# Patient Record
Sex: Female | Born: 1975 | Race: White | Hispanic: No | Marital: Married | Smoking: Never smoker
Health system: Southern US, Community
[De-identification: ages and names within clinical notes are randomized; demographics above are authoritative.]

## PROBLEM LIST (undated history)

## (undated) DIAGNOSIS — Q2111 Secundum atrial septal defect: Secondary | ICD-10-CM

## (undated) DIAGNOSIS — G90A Postural orthostatic tachycardia syndrome (POTS): Secondary | ICD-10-CM

## (undated) DIAGNOSIS — B9689 Other specified bacterial agents as the cause of diseases classified elsewhere: Secondary | ICD-10-CM

## (undated) DIAGNOSIS — H8109 Meniere's disease, unspecified ear: Secondary | ICD-10-CM

## (undated) DIAGNOSIS — H838X1 Other specified diseases of right inner ear: Secondary | ICD-10-CM

## (undated) DIAGNOSIS — N83209 Unspecified ovarian cyst, unspecified side: Secondary | ICD-10-CM

## (undated) DIAGNOSIS — Z975 Presence of (intrauterine) contraceptive device: Secondary | ICD-10-CM

## (undated) DIAGNOSIS — T8859XA Other complications of anesthesia, initial encounter: Secondary | ICD-10-CM

## (undated) DIAGNOSIS — I951 Orthostatic hypotension: Secondary | ICD-10-CM

## (undated) DIAGNOSIS — Z872 Personal history of diseases of the skin and subcutaneous tissue: Secondary | ICD-10-CM

## (undated) DIAGNOSIS — K56609 Unspecified intestinal obstruction, unspecified as to partial versus complete obstruction: Secondary | ICD-10-CM

## (undated) DIAGNOSIS — Z8673 Personal history of transient ischemic attack (TIA), and cerebral infarction without residual deficits: Secondary | ICD-10-CM

## (undated) DIAGNOSIS — I959 Hypotension, unspecified: Secondary | ICD-10-CM

## (undated) DIAGNOSIS — T4145XA Adverse effect of unspecified anesthetic, initial encounter: Secondary | ICD-10-CM

## (undated) DIAGNOSIS — I472 Ventricular tachycardia: Secondary | ICD-10-CM

## (undated) DIAGNOSIS — F419 Anxiety disorder, unspecified: Secondary | ICD-10-CM

## (undated) DIAGNOSIS — R319 Hematuria, unspecified: Secondary | ICD-10-CM

## (undated) DIAGNOSIS — D4989 Neoplasm of unspecified behavior of other specified sites: Secondary | ICD-10-CM

## (undated) DIAGNOSIS — I4729 Other ventricular tachycardia: Secondary | ICD-10-CM

## (undated) DIAGNOSIS — N760 Acute vaginitis: Secondary | ICD-10-CM

## (undated) DIAGNOSIS — Z87898 Personal history of other specified conditions: Secondary | ICD-10-CM

## (undated) DIAGNOSIS — N200 Calculus of kidney: Secondary | ICD-10-CM

## (undated) DIAGNOSIS — E049 Nontoxic goiter, unspecified: Secondary | ICD-10-CM

## (undated) DIAGNOSIS — F514 Sleep terrors [night terrors]: Secondary | ICD-10-CM

## (undated) DIAGNOSIS — R000 Tachycardia, unspecified: Secondary | ICD-10-CM

## (undated) DIAGNOSIS — Q211 Atrial septal defect: Secondary | ICD-10-CM

## (undated) DIAGNOSIS — I498 Other specified cardiac arrhythmias: Secondary | ICD-10-CM

## (undated) HISTORY — DX: Unspecified ovarian cyst, unspecified side: N83.209

## (undated) HISTORY — DX: Neoplasm of unspecified behavior of other specified sites: D49.89

## (undated) HISTORY — DX: Other specified bacterial agents as the cause of diseases classified elsewhere: B96.89

## (undated) HISTORY — PX: THYROID CYST EXCISION: SHX2511

## (undated) HISTORY — DX: Secundum atrial septal defect: Q21.11

## (undated) HISTORY — DX: Tachycardia, unspecified: R00.0

## (undated) HISTORY — PX: TONSILLECTOMY: SUR1361

## (undated) HISTORY — DX: Other ventricular tachycardia: I47.29

## (undated) HISTORY — DX: Orthostatic hypotension: I95.1

## (undated) HISTORY — PX: OTHER SURGICAL HISTORY: SHX169

## (undated) HISTORY — DX: Postural orthostatic tachycardia syndrome (POTS): G90.A

## (undated) HISTORY — DX: Meniere's disease, unspecified ear: H81.09

## (undated) HISTORY — DX: Nontoxic goiter, unspecified: E04.9

## (undated) HISTORY — DX: Other specified cardiac arrhythmias: I49.8

## (undated) HISTORY — DX: Acute vaginitis: N76.0

## (undated) HISTORY — DX: Other specified diseases of right inner ear: H83.8X1

## (undated) HISTORY — PX: CARDIAC SURGERY: SHX584

## (undated) HISTORY — DX: Unspecified intestinal obstruction, unspecified as to partial versus complete obstruction: K56.609

## (undated) HISTORY — DX: Atrial septal defect: Q21.1

## (undated) HISTORY — DX: Presence of (intrauterine) contraceptive device: Z97.5

## (undated) HISTORY — DX: Ventricular tachycardia: I47.2

## (undated) HISTORY — DX: Anxiety disorder, unspecified: F41.9

## (undated) HISTORY — DX: Personal history of transient ischemic attack (TIA), and cerebral infarction without residual deficits: Z86.73

---

## 2005-07-02 ENCOUNTER — Ambulatory Visit: Payer: Self-pay | Admitting: Family Medicine

## 2005-07-24 ENCOUNTER — Ambulatory Visit: Payer: Self-pay | Admitting: Family Medicine

## 2005-08-21 ENCOUNTER — Encounter (INDEPENDENT_AMBULATORY_CARE_PROVIDER_SITE_OTHER): Payer: Self-pay | Admitting: Family Medicine

## 2005-10-10 ENCOUNTER — Ambulatory Visit: Payer: Self-pay | Admitting: Family Medicine

## 2005-10-10 LAB — CONVERTED CEMR LAB: Urinalysis: NORMAL

## 2005-11-07 ENCOUNTER — Ambulatory Visit: Payer: Self-pay | Admitting: Family Medicine

## 2005-11-14 ENCOUNTER — Encounter (INDEPENDENT_AMBULATORY_CARE_PROVIDER_SITE_OTHER): Payer: Self-pay | Admitting: Family Medicine

## 2005-11-14 LAB — CONVERTED CEMR LAB: Pap Smear: NORMAL

## 2005-11-21 ENCOUNTER — Encounter (INDEPENDENT_AMBULATORY_CARE_PROVIDER_SITE_OTHER): Payer: Self-pay | Admitting: Family Medicine

## 2005-11-22 ENCOUNTER — Ambulatory Visit: Payer: Self-pay | Admitting: Family Medicine

## 2005-11-26 ENCOUNTER — Encounter (INDEPENDENT_AMBULATORY_CARE_PROVIDER_SITE_OTHER): Payer: Self-pay | Admitting: Family Medicine

## 2005-11-28 ENCOUNTER — Encounter (INDEPENDENT_AMBULATORY_CARE_PROVIDER_SITE_OTHER): Payer: Self-pay | Admitting: Family Medicine

## 2005-12-03 ENCOUNTER — Encounter: Payer: Self-pay | Admitting: Family Medicine

## 2005-12-03 DIAGNOSIS — F411 Generalized anxiety disorder: Secondary | ICD-10-CM | POA: Insufficient documentation

## 2006-03-03 ENCOUNTER — Telehealth (INDEPENDENT_AMBULATORY_CARE_PROVIDER_SITE_OTHER): Payer: Self-pay | Admitting: Family Medicine

## 2006-05-26 ENCOUNTER — Ambulatory Visit: Payer: Self-pay | Admitting: Family Medicine

## 2006-05-26 ENCOUNTER — Telehealth (INDEPENDENT_AMBULATORY_CARE_PROVIDER_SITE_OTHER): Payer: Self-pay | Admitting: Family Medicine

## 2006-05-28 ENCOUNTER — Telehealth (INDEPENDENT_AMBULATORY_CARE_PROVIDER_SITE_OTHER): Payer: Self-pay | Admitting: *Deleted

## 2006-07-04 ENCOUNTER — Encounter (INDEPENDENT_AMBULATORY_CARE_PROVIDER_SITE_OTHER): Payer: Self-pay | Admitting: Family Medicine

## 2006-11-19 ENCOUNTER — Ambulatory Visit: Payer: Self-pay | Admitting: Family Medicine

## 2006-11-19 LAB — CONVERTED CEMR LAB: Beta hcg, urine, semiquantitative: NEGATIVE

## 2006-12-16 DIAGNOSIS — R32 Unspecified urinary incontinence: Secondary | ICD-10-CM | POA: Insufficient documentation

## 2007-01-15 HISTORY — PX: FOOT NEUROMA SURGERY: SHX646

## 2007-01-20 ENCOUNTER — Ambulatory Visit: Payer: Self-pay | Admitting: Family Medicine

## 2007-02-13 ENCOUNTER — Telehealth (INDEPENDENT_AMBULATORY_CARE_PROVIDER_SITE_OTHER): Payer: Self-pay | Admitting: *Deleted

## 2007-02-13 ENCOUNTER — Ambulatory Visit: Payer: Self-pay | Admitting: Family Medicine

## 2007-02-16 ENCOUNTER — Encounter (INDEPENDENT_AMBULATORY_CARE_PROVIDER_SITE_OTHER): Payer: Self-pay | Admitting: Family Medicine

## 2007-02-18 ENCOUNTER — Encounter (INDEPENDENT_AMBULATORY_CARE_PROVIDER_SITE_OTHER): Payer: Self-pay | Admitting: Family Medicine

## 2007-06-10 ENCOUNTER — Telehealth (INDEPENDENT_AMBULATORY_CARE_PROVIDER_SITE_OTHER): Payer: Self-pay | Admitting: *Deleted

## 2007-06-15 ENCOUNTER — Encounter (INDEPENDENT_AMBULATORY_CARE_PROVIDER_SITE_OTHER): Payer: Self-pay | Admitting: Family Medicine

## 2007-06-23 ENCOUNTER — Other Ambulatory Visit: Admission: RE | Admit: 2007-06-23 | Discharge: 2007-06-23 | Payer: Self-pay

## 2007-06-23 ENCOUNTER — Encounter (INDEPENDENT_AMBULATORY_CARE_PROVIDER_SITE_OTHER): Payer: Self-pay | Admitting: Family Medicine

## 2007-06-23 ENCOUNTER — Ambulatory Visit: Payer: Self-pay | Admitting: Family Medicine

## 2007-06-23 ENCOUNTER — Telehealth (INDEPENDENT_AMBULATORY_CARE_PROVIDER_SITE_OTHER): Payer: Self-pay | Admitting: *Deleted

## 2007-06-29 LAB — CONVERTED CEMR LAB: Pap Smear: NORMAL

## 2007-06-30 ENCOUNTER — Encounter: Admission: RE | Admit: 2007-06-30 | Discharge: 2007-06-30 | Payer: Self-pay | Admitting: Family Medicine

## 2007-11-16 ENCOUNTER — Ambulatory Visit: Payer: Self-pay | Admitting: Family Medicine

## 2007-12-07 ENCOUNTER — Ambulatory Visit: Payer: Self-pay | Admitting: Family Medicine

## 2007-12-07 ENCOUNTER — Ambulatory Visit (HOSPITAL_COMMUNITY): Admission: RE | Admit: 2007-12-07 | Discharge: 2007-12-07 | Payer: Self-pay | Admitting: Family Medicine

## 2007-12-07 LAB — CONVERTED CEMR LAB
Basophils Absolute: 0 10*3/uL (ref 0.0–0.1)
Beta hcg, urine, semiquantitative: NEGATIVE
HCT: 40.2 % (ref 36.0–46.0)
Hemoglobin: 13.6 g/dL (ref 12.0–15.0)
Lymphocytes Relative: 23 % (ref 12–46)
MCHC: 34 g/dL (ref 30.0–36.0)
MCV: 88.9 fL (ref 78.0–100.0)
Monocytes Absolute: 0.4 10*3/uL (ref 0.1–1.0)
Monocytes Relative: 7 % (ref 3–12)
Neutro Abs: 3.3 10*3/uL (ref 1.7–7.7)
Urobilinogen, UA: 0.2
WBC: 5 10*3/uL (ref 4.0–10.5)

## 2007-12-28 ENCOUNTER — Ambulatory Visit: Payer: Self-pay | Admitting: Family Medicine

## 2007-12-28 DIAGNOSIS — N83209 Unspecified ovarian cyst, unspecified side: Secondary | ICD-10-CM | POA: Insufficient documentation

## 2007-12-30 ENCOUNTER — Telehealth (INDEPENDENT_AMBULATORY_CARE_PROVIDER_SITE_OTHER): Payer: Self-pay | Admitting: *Deleted

## 2008-01-13 ENCOUNTER — Encounter (INDEPENDENT_AMBULATORY_CARE_PROVIDER_SITE_OTHER): Payer: Self-pay | Admitting: Family Medicine

## 2008-01-27 ENCOUNTER — Encounter (INDEPENDENT_AMBULATORY_CARE_PROVIDER_SITE_OTHER): Payer: Self-pay | Admitting: Family Medicine

## 2008-02-02 ENCOUNTER — Ambulatory Visit: Payer: Self-pay | Admitting: Family Medicine

## 2008-02-08 ENCOUNTER — Ambulatory Visit: Payer: Self-pay | Admitting: Family Medicine

## 2008-02-10 ENCOUNTER — Telehealth (INDEPENDENT_AMBULATORY_CARE_PROVIDER_SITE_OTHER): Payer: Self-pay | Admitting: Family Medicine

## 2008-02-10 ENCOUNTER — Encounter (INDEPENDENT_AMBULATORY_CARE_PROVIDER_SITE_OTHER): Payer: Self-pay | Admitting: Family Medicine

## 2008-02-24 ENCOUNTER — Ambulatory Visit: Payer: Self-pay | Admitting: Family Medicine

## 2008-02-24 LAB — CONVERTED CEMR LAB: Rapid Strep: NEGATIVE

## 2008-03-11 ENCOUNTER — Encounter (INDEPENDENT_AMBULATORY_CARE_PROVIDER_SITE_OTHER): Payer: Self-pay | Admitting: Family Medicine

## 2008-07-01 ENCOUNTER — Encounter: Admission: RE | Admit: 2008-07-01 | Discharge: 2008-07-01 | Payer: Self-pay | Admitting: Family Medicine

## 2008-08-05 ENCOUNTER — Ambulatory Visit: Payer: Self-pay | Admitting: Family Medicine

## 2008-08-05 ENCOUNTER — Other Ambulatory Visit: Admission: RE | Admit: 2008-08-05 | Discharge: 2008-08-05 | Payer: Self-pay | Admitting: Family Medicine

## 2008-08-05 ENCOUNTER — Encounter (INDEPENDENT_AMBULATORY_CARE_PROVIDER_SITE_OTHER): Payer: Self-pay | Admitting: Family Medicine

## 2008-08-06 ENCOUNTER — Encounter (INDEPENDENT_AMBULATORY_CARE_PROVIDER_SITE_OTHER): Payer: Self-pay | Admitting: Family Medicine

## 2008-08-08 LAB — CONVERTED CEMR LAB
Basophils Absolute: 0 10*3/uL (ref 0.0–0.1)
Basophils Relative: 1 % (ref 0–1)
Candida species: NEGATIVE
Eosinophils Absolute: 0.1 10*3/uL (ref 0.0–0.7)
Eosinophils Relative: 1 % (ref 0–5)
Gardnerella vaginalis: POSITIVE — AB
HCT: 41.4 % (ref 36.0–46.0)
Hemoglobin: 13.3 g/dL (ref 12.0–15.0)
RBC: 4.55 M/uL (ref 3.87–5.11)
TSH: 1.411 microintl units/mL (ref 0.350–4.500)

## 2008-08-11 ENCOUNTER — Encounter (INDEPENDENT_AMBULATORY_CARE_PROVIDER_SITE_OTHER): Payer: Self-pay | Admitting: Family Medicine

## 2008-08-12 ENCOUNTER — Telehealth (INDEPENDENT_AMBULATORY_CARE_PROVIDER_SITE_OTHER): Payer: Self-pay | Admitting: *Deleted

## 2008-08-19 ENCOUNTER — Ambulatory Visit: Payer: Self-pay | Admitting: Family Medicine

## 2008-08-19 LAB — CONVERTED CEMR LAB
Bilirubin Urine: NEGATIVE
Glucose, Urine, Semiquant: NEGATIVE
Nitrite: NEGATIVE
Protein, U semiquant: >=300
Specific Gravity, Urine: 1.02

## 2008-08-23 ENCOUNTER — Ambulatory Visit: Payer: Self-pay | Admitting: Family Medicine

## 2008-08-25 ENCOUNTER — Encounter (INDEPENDENT_AMBULATORY_CARE_PROVIDER_SITE_OTHER): Payer: Self-pay | Admitting: Family Medicine

## 2008-09-01 ENCOUNTER — Ambulatory Visit: Payer: Self-pay | Admitting: Pulmonary Disease

## 2008-09-09 ENCOUNTER — Ambulatory Visit: Payer: Self-pay | Admitting: Cardiovascular Disease

## 2008-09-22 ENCOUNTER — Ambulatory Visit: Payer: Self-pay | Admitting: Family Medicine

## 2008-10-10 ENCOUNTER — Encounter (INDEPENDENT_AMBULATORY_CARE_PROVIDER_SITE_OTHER): Payer: Self-pay | Admitting: Family Medicine

## 2009-07-07 ENCOUNTER — Encounter: Admission: RE | Admit: 2009-07-07 | Discharge: 2009-07-07 | Payer: Self-pay | Admitting: Family Medicine

## 2010-02-04 ENCOUNTER — Encounter: Payer: Self-pay | Admitting: Family Medicine

## 2010-03-12 ENCOUNTER — Encounter (HOSPITAL_COMMUNITY): Payer: Self-pay

## 2010-03-12 ENCOUNTER — Inpatient Hospital Stay (HOSPITAL_COMMUNITY): Payer: BC Managed Care – PPO

## 2010-03-12 ENCOUNTER — Observation Stay (HOSPITAL_COMMUNITY)
Admission: AD | Admit: 2010-03-12 | Discharge: 2010-03-15 | DRG: 035 | Disposition: A | Discharge status: Home / Self Care | Payer: BC Managed Care – PPO | Source: Ambulatory Visit | Attending: Neurology | Admitting: Neurology

## 2010-03-12 DIAGNOSIS — N949 Unspecified condition associated with female genital organs and menstrual cycle: Secondary | ICD-10-CM | POA: Insufficient documentation

## 2010-03-12 DIAGNOSIS — R5381 Other malaise: Secondary | ICD-10-CM | POA: Insufficient documentation

## 2010-03-12 DIAGNOSIS — R5383 Other fatigue: Secondary | ICD-10-CM | POA: Insufficient documentation

## 2010-03-12 DIAGNOSIS — R209 Unspecified disturbances of skin sensation: Secondary | ICD-10-CM | POA: Insufficient documentation

## 2010-03-12 DIAGNOSIS — N938 Other specified abnormal uterine and vaginal bleeding: Secondary | ICD-10-CM | POA: Insufficient documentation

## 2010-03-12 DIAGNOSIS — R29810 Facial weakness: Principal | ICD-10-CM | POA: Insufficient documentation

## 2010-03-12 DIAGNOSIS — R42 Dizziness and giddiness: Secondary | ICD-10-CM | POA: Insufficient documentation

## 2010-03-12 LAB — APTT: aPTT: 28 seconds (ref 24–37)

## 2010-03-12 LAB — CBC
HCT: 42.1 % (ref 36.0–46.0)
MCH: 30.5 pg (ref 26.0–34.0)
Platelets: 262 10*3/uL (ref 150–400)
RBC: 4.69 MIL/uL (ref 3.87–5.11)
RDW: 13.1 % (ref 11.5–15.5)
WBC: 6.8 10*3/uL (ref 4.0–10.5)

## 2010-03-12 LAB — COMPREHENSIVE METABOLIC PANEL
AST: 22 U/L (ref 0–37)
Calcium: 9.3 mg/dL (ref 8.4–10.5)
Creatinine, Ser: 0.89 mg/dL (ref 0.4–1.2)
GFR calc Af Amer: >60 mL/min (ref >60)
GFR calc non Af Amer: >60 mL/min (ref >60)
Total Bilirubin: 0.8 mg/dL (ref 0.3–1.2)

## 2010-03-12 LAB — PROTIME-INR: INR: 0.97 (ref 0.00–1.49)

## 2010-03-13 ENCOUNTER — Inpatient Hospital Stay (HOSPITAL_COMMUNITY): Payer: BC Managed Care – PPO

## 2010-03-13 ENCOUNTER — Encounter (HOSPITAL_COMMUNITY): Payer: Self-pay | Admitting: Radiology

## 2010-03-13 LAB — URINALYSIS, ROUTINE W REFLEX MICROSCOPIC
Ketones, ur: 15 mg/dL — AB
Nitrite: NEGATIVE
Protein, ur: NEGATIVE mg/dL
Urobilinogen, UA: 1 mg/dL (ref 0.0–1.0)
pH: 6 (ref 5.0–8.0)

## 2010-03-13 LAB — URINE MICROSCOPIC-ADD ON

## 2010-03-13 LAB — LIPID PANEL
Cholesterol: 166 mg/dL (ref 0–200)
Total CHOL/HDL Ratio: 3.3 RATIO
VLDL: 18 mg/dL (ref 0–40)

## 2010-03-13 LAB — PREGNANCY, URINE: Preg Test, Ur: NEGATIVE

## 2010-03-13 LAB — RPR: RPR Ser Ql: NONREACTIVE

## 2010-03-13 LAB — RAPID URINE DRUG SCREEN, HOSP PERFORMED
Benzodiazepines: NOT DETECTED
Cocaine: NOT DETECTED
Opiates: NOT DETECTED
Tetrahydrocannabinol: NOT DETECTED

## 2010-03-13 MED ORDER — IOHEXOL 350 MG/ML SOLN
100.0000 mL | Freq: Once | INTRAVENOUS | Status: AC | PRN
  Administered 2010-03-13: 64 mL via INTRAVENOUS

## 2010-03-14 ENCOUNTER — Inpatient Hospital Stay (HOSPITAL_COMMUNITY): Payer: BC Managed Care – PPO

## 2010-03-14 DIAGNOSIS — G459 Transient cerebral ischemic attack, unspecified: Secondary | ICD-10-CM

## 2010-03-14 LAB — LUPUS ANTICOAGULANT PANEL: dRVVT Incubated 1:1 Mix: 41.6 secs (ref 36.2–44.3)

## 2010-03-14 LAB — C3 COMPLEMENT: C3 Complement: 111 mg/dL (ref 88–201)

## 2010-03-14 LAB — FACTOR 5 LEIDEN

## 2010-03-14 LAB — ANTITHROMBIN III: AntiThromb III Func: 109 % (ref 76–126)

## 2010-03-15 LAB — PROTIME-INR: Prothrombin Time: 12.7 seconds (ref 11.6–15.2)

## 2010-03-15 LAB — CBC
MCH: 30 pg (ref 26.0–34.0)
MCHC: 33.2 g/dL (ref 30.0–36.0)
Platelets: 211 10*3/uL (ref 150–400)
RBC: 4.16 MIL/uL (ref 3.87–5.11)

## 2010-03-15 LAB — BETA-2-GLYCOPROTEIN I ABS, IGG/M/A
Beta-2-Glycoprotein I IgA: 18 A Units (ref <20)
Beta-2-Glycoprotein I IgM: 19 M Units (ref <20)

## 2010-03-15 LAB — CARDIOLIPIN ANTIBODIES, IGG, IGM, IGA: Anticardiolipin IgA: 3 APL U/mL — ABNORMAL LOW (ref <22)

## 2010-03-15 NOTE — H&P (Signed)
NAME:  Estrada, Candice             ACCOUNT NO.:  [id number]  MEDICAL RECORD NO.:  [id number]          PATIENT TYPE:  LOCATION:                                 FACILITY:  PHYSICIAN:  Pramod P. Pearlean Brownie, MD         DATE OF BIRTH:  DATE OF ADMISSION: DATE OF DISCHARGE:                             HISTORY & PHYSICAL   [name] is seen emergently.  He is admitted emergently from my office today, where she was seen for symptoms of sudden onset of dizziness, numbness and left-sided weakness.  She is a 35 year old pleasant [name], who had a bath today and was drying her hair as she bent her neck up, she heard a sudden popping sound in her left ear followed by numbness involving the left cheek as well as upper extremity as well as weakness.  The husband noticed she had left facial droop, left hand weakness lasted only few minutes and recovered, but facial weakness lasted longer.  She called our office and was worked in emergently and stated her symptoms began at 8 o'clock, but within an hour left-sided weakness as well as facial weakness had improved, but left cheek and arm numbness has persisted.  She denies any headache, vertigo, nausea, leg weakness, gait or balance problems.  She has no history of recent injury to her neck being involved in a car accident.  There is no prior history of stroke, TIA or seizures.  She does have history of migraines which are not very active.  PAST MEDICAL HISTORY:  History of abortion, thyroid surgery for cyst.  MEDICATION ALLERGIES:  None listed.  HOME MEDICATIONS:  Estrogen pills.  REVIEW OF SYSTEMS:  Negative for any deep vein thrombosis, pulmonary embolism, recurrent miscarriages, chest pain, palpitations or diarrhea.  FAMILY HISTORY:  Not significant for anybody with strokes or TIAs at a young age.  PAST SURGICAL HISTORY:  Thyroid surgery.  PHYSICAL EXAMINATION:  GENERAL:  Anxious young [name] who is currently not in  distress. VITAL SIGNS:  She is afebrile, pulse rate is 92 per minute regular sinus, blood pressure is 143/81 earlier today, but in my office it is 105/70.  Distal pulses well felt. HEAD:  Nontraumatic. NECK:  Supple.  There is no bruit.  Hearing appears normal bilaterally. CARDIAC:  Regular heart sounds.  No murmur or gallop. LUNGS:  Clear to auscultation. ABDOMEN:  Soft and nontender. NEUROLOGICAL:  The patient is awake and alert.  She is oriented to time, place and person.  There is no aphasia, apraxia, or dysarthria, and movements are full range, has not seen nystagmus or internuclear ophthalmoplegia.  Visual acuity and fields seem adequate.  Face seems fairly symmetric.  I do not see any obvious weakness.  There is no upper extremity drift.  There is a mild finger-to-nose dysmetria on the left. There is decreased touch and pinprick sensation in the left face and upper extremity, but not in the lower extremities.  There is no drift. There is no focal weakness.  Her gait is slow, cautious, but steady. She has difficulty with tandem walking.  On a NIH stroke scale,  she scored 2, one for sensory loss and one for dysmetria.  DATA REVIEWED:  None.  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  The patient has no obvious vascular risk factors except being on estrogen pills.  PLAN:  The patient will be admitted emergently to [facility] for stroke workup.  She has presented within 12 hours of onset of her symptoms.  We will obtain stat CT scan of the head and stroke panel labs.  If the patient is interested, she may consider participation in the POINT trial.  We will check MRI scan of the brain with MRA of the brain, neck, echocardiogram, fasting lipid profile and hemoglobin A1c. Start aspirin  after she passes a swallow test.  We may need to check hypercoagulable panel and vasculitic panel labs as well.  The patient is critically ill at significant risk for worsening.  Her care requires medical decision making of high complexity.  I spent 1 hour of critical care time evaluating this patient and following her care.     Pramod P. Pearlean Brownie, MD     PPS/MEDQ  D:  03/12/2010  T:  03/13/2010  Job:  [id number]  Electronically Signed by Delia Heady MD on 03/15/2010 01:23:32 PM

## 2010-03-17 LAB — PROTEIN C, TOTAL: Protein C, Total: 122 % (ref 70–140)

## 2010-03-19 NOTE — Discharge Summary (Signed)
NAME:  [name], [name]             ACCOUNT NO.:  [id number]  MEDICAL RECORD NO.:  [id number]           PATIENT TYPE:  I  LOCATION:  3007                         FACILITY:  [facility]  PHYSICIAN:  Joycelyn Schmid, MD   DATE OF BIRTH:  08/21/1975  DATE OF ADMISSION:  03/12/2010 DATE OF DISCHARGE:  03/15/2010                              DISCHARGE SUMMARY   DIAGNOSES AT TIME OF DISCHARGE: 1. Non-organic left-sided weakness. 2. History of abortion. 3. Thyroid surgery for cyst. 4. Vaginal bleeding.  MEDICINES AT TIME OF DISCHARGE:  Oral contraceptives.  STUDIES PERFORMED: 1. MRI shows no acute stroke. 2. CT head negative. 3. CT angio of the neck is negative. 4. CT angio of the head is negative. 5. MRI C-spine is normal.  Mild degenerative changes without stenosis     or disk protrusion. 6. A 2-D echocardiogram shows EF of 55% to 60% with no obvious source     of embolus. 7. Carotid Doppler shows no ICA stenosis. 8. EKG shows normal sinus rhythm with nonspecific ST-T changes.  No     old tracing to compare.  LABORATORY STUDIES:  CH50 greater than 60.  Hypercoagulable panel with antithrombin III normal.  Protein C functional high at 139.  Protein S functional 122.  Lupus anticoagulant not detected.  Homocystine 8.1. Negative factor V mutation mostly.  Cardiolipin antibody negative. Coagulation studies normal.  CBC normal.  ANA negative.  C3 and C4 normal.  RPR normal.  Sed rate 4.  Hemoglobin A1c 5.2.  Urine drug screen negative.  Urinalysis with 3-4 white blood cells, few bacteria. Urine pregnancy is negative.  Cholesterol 166, triglycerides 92, HDL 50, LDL 98.  Chemistry is normal.  HISTORY OF PRESENT ILLNESS:  [name] was seen by Dr. Pearlean Brownie in his office on March 12, 2010, for symptoms of sudden onset of dizziness, numbness, and left-sided weakness.  She is a 35 year old pleasant Caucasian female who had a bath today and was trying her hair as she bent her neck  up she heard a sudden popping found in her left ear followed by numbness involving the left cheek as well as the upper extremity weakness.  Her husband noted she had some left facial droop, left hand weakness that lasted only a few minutes then recovered.  Her facial weakness lasted longer than her hand weakness.  She called [facility] and was worked in emergently.  She stated her symptoms began at 8 a.m., but within an hour the left hematemesis and facial weakness had improved but the left cheek and arm numbness persisted.  She denied headache, vertigo, nausea, leg weakness, gait, or balance problems.  She has no history of recent neck injury or car accident.  She has no history of stroke, TIA, or seizures.  She does have a history of migraines which were not very active.  Concern was that the patient had a brainstem infarct or possible left vertebral artery dissection.  She was not a TPA candidate secondary to quick resolution and timing.  She was admitted to the hospital for further stroke evaluation. HOSPITAL COURSE:  Workup was normal including CT of the  head, MRI and MRA of the head, CT angio of the head and neck, and MRI of the C-spine. The patient was initially enrolled into the POINT trial which is aspirin and Plavix versus aspirin alone for secondary prevention in patients with TIA or mild stroke.  She was enrolled.  At time of discharge, the patient's medicines were on hold as she was having vaginal bleeding after her cycle just ended 10 days previously.  [facility] will follow up with her.  In hospital, no organic reason was found for her left hemeparesis and facial droop.  The patient has had a lot of stressors in her life in the recent few months that may have contributed.  I have asked her to follow up with her primary care physician for any further recommendation.  Hospitalization was extended due to the patient's inability to tolerate  MRI and MRA without sedation.  CONDITION AT TIME OF DISCHARGE:  The patient alert and oriented x3. Normal speech.  Normal language.  Eye movements are full.  No drift or focal weakness.  The patient and RN had noted confusion the night prior but not present at time of discharge.  DISCHARGE AND PLAN: 1. Discharge home with family. 2. Followup OB/GYN related to vaginal bleeding within the next week. 3. Hold POINT study drugs for now.  [facility] to follow up with the patient. 4. Followup primary care physician within 1 month. 5. Follopup with neurology as needed.     Annie Main, N.P.   ______________________________ Joycelyn Schmid, MD    SB/MEDQ  D:  03/15/2010  T:  03/16/2010  Job:  [id number]  cc:   Pramod P. Pearlean Brownie, MD Dr. Margo Aye.  Electronically Signed by Annie Main N.P. on 03/16/2010 11:27:24 AM Electronically Signed by Joycelyn Schmid  on 03/19/2010 12:39:54 PM

## 2010-05-09 ENCOUNTER — Encounter: Payer: Self-pay | Admitting: Physician Assistant

## 2010-05-11 ENCOUNTER — Encounter: Payer: Self-pay | Admitting: Internal Medicine

## 2010-05-14 ENCOUNTER — Institutional Professional Consult (permissible substitution): Payer: BC Managed Care – PPO | Admitting: Internal Medicine

## 2010-05-18 ENCOUNTER — Encounter: Payer: Self-pay | Admitting: Internal Medicine

## 2010-06-07 ENCOUNTER — Other Ambulatory Visit: Payer: Self-pay | Admitting: Family Medicine

## 2010-06-07 DIAGNOSIS — Z1231 Encounter for screening mammogram for malignant neoplasm of breast: Secondary | ICD-10-CM

## 2010-06-08 ENCOUNTER — Ambulatory Visit (HOSPITAL_COMMUNITY)
Admission: RE | Admit: 2010-06-08 | Discharge: 2010-06-08 | Disposition: A | Discharge status: Home / Self Care | Payer: BC Managed Care – PPO | Source: Ambulatory Visit | Attending: Internal Medicine | Admitting: Internal Medicine

## 2010-06-08 DIAGNOSIS — Q211 Atrial septal defect: Secondary | ICD-10-CM | POA: Insufficient documentation

## 2010-06-08 DIAGNOSIS — G459 Transient cerebral ischemic attack, unspecified: Secondary | ICD-10-CM | POA: Insufficient documentation

## 2010-06-08 DIAGNOSIS — Q2111 Secundum atrial septal defect: Secondary | ICD-10-CM | POA: Insufficient documentation

## 2010-06-12 NOTE — Cardiovascular Report (Signed)
  NAME:  Estrada, Candice             ACCOUNT NO.:  [id number]  MEDICAL RECORD NO.:  [id number]           PATIENT TYPE:  O  LOCATION:  [facility]                         FACILITY:  [facility]  PHYSICIAN:  Italy Waverly Chavarria, MD         DATE OF BIRTH:  06/14/1975  DATE OF PROCEDURE:  06/08/2010 DATE OF DISCHARGE:                           CARDIAC CATHETERIZATION   OPERATOR:  Italy Crescent Gotham, MD  PROCEDURE:  Transesophageal echocardiography.  INDICATIONS:  Right-to-left shunt.  PAST MEDICAL HISTORY:  Ms. [name] is a 35 year old female who was found to have a large right-to-left shunt in the setting of a TIA was referred for a TEE for evaluation of that shunt.  PROCEDURE:  After procedural time-out, the patient was brought into the endoscopy suite and moderately sedated.  Once the sedation was in effect, a TEE was performed without any complications.  The patient recovered without difficulty and was discharged home.  FINDINGS: 1. Moderate left-to-right shunt by color Doppler without right to left     shunting by saline microbubble contrast, consistent with PFO.  2. LVEF greater than 55%. 3. Normal pulmonary venous connection and no evidence of AV cushion     defect or sinus venosus defect. 4. Trace MR and TR, with normal-appearing mitral valve leaflets.     Italy Rayli Wiederhold, MD     CH/MEDQ  D:  06/08/2010  T:  06/09/2010  Job:  [id number]  cc:   Pramod P. Pearlean Brownie, MD  Electronically Signed by Kirtland Bouchard. Ibrahima Holberg M.D. on 06/12/2010 11:57:44 AM

## 2010-07-02 ENCOUNTER — Ambulatory Visit: Payer: BC Managed Care – PPO | Admitting: Cardiovascular Disease

## 2010-07-13 ENCOUNTER — Ambulatory Visit
Admission: RE | Admit: 2010-07-13 | Discharge: 2010-07-13 | Disposition: A | Discharge status: Home / Self Care | Payer: BC Managed Care – PPO | Source: Ambulatory Visit | Attending: Family Medicine | Admitting: Family Medicine

## 2010-07-13 DIAGNOSIS — Z1231 Encounter for screening mammogram for malignant neoplasm of breast: Secondary | ICD-10-CM

## 2010-07-26 ENCOUNTER — Encounter: Payer: Self-pay | Admitting: Cardiovascular Disease

## 2010-07-31 ENCOUNTER — Ambulatory Visit (INDEPENDENT_AMBULATORY_CARE_PROVIDER_SITE_OTHER): Payer: BC Managed Care – PPO | Admitting: Cardiovascular Disease

## 2010-07-31 ENCOUNTER — Encounter: Payer: Self-pay | Admitting: *Deleted

## 2010-07-31 ENCOUNTER — Encounter: Payer: Self-pay | Admitting: Cardiovascular Disease

## 2010-07-31 VITALS — BP 104/69 | HR 74 | Resp 12 | Ht 63.0 in | Wt 141.0 lb

## 2010-07-31 DIAGNOSIS — Q211 Atrial septal defect: Secondary | ICD-10-CM

## 2010-07-31 MED ORDER — CLOPIDOGREL BISULFATE 75 MG PO TABS: 75.0000 mg | ORAL_TABLET | Freq: Every day | ORAL | Status: DC

## 2010-07-31 NOTE — Patient Instructions (Signed)
Procedure is scheduled for August 20, 2010 at [facility].  Your physician recommends that you return for lab work on August 15, 2010.  Your physician has recommended you make the following change in your medication: Start Plavix 75 mg by mouth daily.

## 2010-08-07 ENCOUNTER — Encounter: Payer: Self-pay | Admitting: *Deleted

## 2010-08-09 ENCOUNTER — Telehealth: Payer: Self-pay | Admitting: *Deleted

## 2010-08-09 NOTE — Telephone Encounter (Signed)
Spoke with pt and let her know lab work would be done at the hospital on the day of her procedure.

## 2010-08-10 ENCOUNTER — Encounter: Payer: Self-pay | Admitting: Cardiovascular Disease

## 2010-08-10 DIAGNOSIS — Q211 Atrial septal defect, unspecified: Secondary | ICD-10-CM | POA: Insufficient documentation

## 2010-08-10 NOTE — Progress Notes (Signed)
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  The patient presented February 27 with sudden onset of dizziness accompanied with left-sided weakness and numbness. She was also noted to have left facial droop and facial numbness. The patient was hospitalized and underwent extensive workup including CT and MRI of the brain. She underwent an MRA. This evaluation was negative. In followup she underwent a transcranial Doppler study which showed a very strongly positive intracardiac shunt. Patient ultimately underwent transesophageal echocardiogram by Dr. Rennis Golden on May 25 and this demonstrated a small ostium secundum ASD with moderate left to right shunting by color Doppler with normal left ventricular function and normal pulmonary venous return. There were no significant valvular lesions. The patient is now referred for consideration of transcatheter closure.  The patient has had further episodes of blurry vision and difficulty in word finding. These have been transient with episodes lasting up to one hour. She has not sought further medical attention. She has occasional chest pains and notably underwent a stress perfusion study which was reviewed today. This was negative for ischemia.  The patient has no history of DVT or arterial thrombosis. She has no family history of hypercoagulable disorder or congenital heart disease.  Outpatient Encounter Prescriptions as of 07/31/2010  Medication Sig Dispense Refill  . aspirin 81 MG tablet Take 81 mg by mouth daily.        . furosemide (LASIX) 20 MG tablet Take 20 mg by mouth as needed.        . vitamin B-12 (CYANOCOBALAMIN) 1000 MCG tablet Take 1,000 mcg by mouth daily.        . clopidogrel (PLAVIX) 75 MG tablet Take 1 tablet (75 mg total) by mouth daily.  30 tablet  11  . DISCONTD: buPROPion (WELLBUTRIN SR) 150 MG 12 hr tablet Take 150 mg by mouth daily.        Marland Kitchen DISCONTD: clonazePAM (KLONOPIN) 0.5 MG tablet Take 0.5 mg by mouth  daily.        Marland Kitchen DISCONTD: Lorita Officer Triphasic (TRI-SPRINTEC) 0.18/0.215/0.25 MG-35 MCG TABS Take by mouth as directed.          Review of patient's allergies indicates no known allergies.  Past Medical History  Diagnosis Date  . Ovarian cyst   . Urinary incontinence   . Migraine headache   . Anxiety     Past Surgical History  Procedure Date  . Thyroid cyst rem   . Tonsillectomy   . Foot neuroma surgery 2009    left  . Thyroid cyst excision     History   Social History  . Marital Status: Married    Spouse Name: N/A    Number of Children: N/A  . Years of Education: N/A   Occupational History  . 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 Smoking status: Never Smoker   . Smokeless tobacco: Not on file  . Alcohol Use: No  . Drug Use: No  . Sexually Active: Not on file   Other Topics Concern  . Not on file   Social History Narrative  . No narrative on file    Family History  Problem Relation Age of Onset  . Hypertension Father 74  . Stroke Father   . Heart attack Father   . Brain cancer Father   . Liver disease Mother 21    dead  . Hypertension Brother 35  . Breast cancer Other  PATERNAL AUNT    ROS: General: no fevers/chills/night sweats, positive for generalized fatigue Eyes: no blurry vision, diplopia, or amaurosis ENT: no sore throat or hearing loss Resp: no cough, wheezing, or hemoptysis CV: no edema or palpitations GI: no abdominal pain, nausea, vomiting, diarrhea, or constipation GU: no dysuria, frequency, or hematuria Skin: no rash Neuro: see history of present illness Musculoskeletal: no joint pain or swelling Heme: no bleeding, DVT, or easy bruising Endo: no polydipsia or polyuria  BP 104/69  Pulse 74  Resp 12  Ht 5\' 3"  (1.6 m)  Wt 141 lb (63.957 kg)  BMI 24.98 kg/m2  PHYSICAL EXAM: Pt is alert and oriented, WD, WN, in no distress. HEENT: normal Neck: JVP normal. Carotid upstrokes normal without  bruits. No thyromegaly. Lungs: equal expansion, clear bilaterally CV: Apex is discrete and nondisplaced, RRR without murmur or gallop Abd: soft, NT, +BS, no bruit, no hepatosplenomegaly Back: no CVA tenderness Ext: no C/C/E        Femoral pulses 2+= without bruits        DP/PT pulses intact and = Skin: warm and dry without rash Neuro: CNII-XII intact             Strength intact = bilaterally  EKG:  Normal sinus rhythm 85 beats per minute, within normal limits.  ASSESSMENT AND PLAN:

## 2010-08-10 NOTE — Assessment & Plan Note (Signed)
This is a 35 year old woman with recurrent TIA symptoms in the setting of ostium secundum ASD. She has no traditional risk factors for stroke. Her TEE images were reviewed and I agree that she has a small secundum ASD with moderate left-to-right shunting. These defects typically have bidirectional shunting with Valsalva. The patient has a strong desire for transcatheter closure. Potential benefits include prevention of recurrent paradoxical embolism and prevention of long-term right heart volume overload although this is probably less of an issue with a defect of this size.  We discussed risks, benefits, and alternatives to transcatheter closure at length. Risks include bleeding, infection, device embolization, stroke, myocardial infarction, cardiac perforation or tamponade, emergency surgery, and death. We also discussed the early or late device erosion and she understands this.  She understands the risk of a serious adverse event occurs approximately 1% of the time.  The patient will be started on Plavix 75 mg daily. She'll need to follow SBE prophylaxis for 6 months after device implantation.

## 2010-08-15 ENCOUNTER — Other Ambulatory Visit (INDEPENDENT_AMBULATORY_CARE_PROVIDER_SITE_OTHER): Payer: BC Managed Care – PPO | Admitting: *Deleted

## 2010-08-15 ENCOUNTER — Other Ambulatory Visit: Payer: BC Managed Care – PPO | Admitting: *Deleted

## 2010-08-15 DIAGNOSIS — Q2111 Secundum atrial septal defect: Secondary | ICD-10-CM

## 2010-08-15 DIAGNOSIS — Q211 Atrial septal defect: Secondary | ICD-10-CM

## 2010-08-15 LAB — BASIC METABOLIC PANEL
BUN: 10 mg/dL (ref 6–23)
CO2: 27 mEq/L (ref 19–32)
Calcium: 8.7 mg/dL (ref 8.4–10.5)
Creatinine, Ser: 0.9 mg/dL (ref 0.4–1.2)
Glucose, Bld: 88 mg/dL (ref 70–99)
Sodium: 140 mEq/L (ref 135–145)

## 2010-08-15 LAB — CBC WITH DIFFERENTIAL/PLATELET
Basophils Relative: 0.4 % (ref 0.0–3.0)
Eosinophils Relative: 1.9 % (ref 0.0–5.0)
HCT: 38 % (ref 36.0–46.0)
Hemoglobin: 12.6 g/dL (ref 12.0–15.0)
Lymphs Abs: 1.3 10*3/uL (ref 0.7–4.0)
MCV: 89.6 fl (ref 78.0–100.0)
Monocytes Absolute: 0.3 10*3/uL (ref 0.1–1.0)
Monocytes Relative: 6.8 % (ref 3.0–12.0)
Neutro Abs: 3.4 10*3/uL (ref 1.4–7.7)
RBC: 4.24 Mil/uL (ref 3.87–5.11)
WBC: 5.2 10*3/uL (ref 4.5–10.5)

## 2010-08-20 ENCOUNTER — Inpatient Hospital Stay (HOSPITAL_COMMUNITY)
Admission: RE | Admit: 2010-08-20 | Discharge: 2010-08-22 | DRG: 550 | Disposition: A | Discharge status: Home / Self Care | Payer: BC Managed Care – PPO | Source: Ambulatory Visit | Attending: Cardiovascular Disease | Admitting: Cardiovascular Disease

## 2010-08-20 DIAGNOSIS — D62 Acute posthemorrhagic anemia: Secondary | ICD-10-CM | POA: Diagnosis not present

## 2010-08-20 DIAGNOSIS — Q211 Atrial septal defect: Secondary | ICD-10-CM

## 2010-08-20 DIAGNOSIS — F411 Generalized anxiety disorder: Secondary | ICD-10-CM | POA: Diagnosis present

## 2010-08-20 DIAGNOSIS — G43909 Migraine, unspecified, not intractable, without status migrainosus: Secondary | ICD-10-CM | POA: Diagnosis present

## 2010-08-20 DIAGNOSIS — Q2111 Secundum atrial septal defect: Principal | ICD-10-CM

## 2010-08-20 DIAGNOSIS — Z7982 Long term (current) use of aspirin: Secondary | ICD-10-CM

## 2010-08-20 DIAGNOSIS — IMO0002 Reserved for concepts with insufficient information to code with codable children: Secondary | ICD-10-CM | POA: Diagnosis not present

## 2010-08-20 HISTORY — PX: ASD REPAIR: SHX258

## 2010-08-20 LAB — PREGNANCY, URINE: Preg Test, Ur: NEGATIVE

## 2010-08-20 LAB — CBC
HCT: 31 % — ABNORMAL LOW (ref 36.0–46.0)
Hemoglobin: 10 g/dL — ABNORMAL LOW (ref 12.0–15.0)
MCH: 28.9 pg (ref 26.0–34.0)
RBC: 3.46 MIL/uL — ABNORMAL LOW (ref 3.87–5.11)

## 2010-08-20 LAB — POCT ACTIVATED CLOTTING TIME
Activated Clotting Time: 204 seconds
Activated Clotting Time: 188 seconds

## 2010-08-21 ENCOUNTER — Ambulatory Visit (HOSPITAL_COMMUNITY): Payer: BC Managed Care – PPO

## 2010-08-21 DIAGNOSIS — Q211 Atrial septal defect: Secondary | ICD-10-CM

## 2010-08-21 LAB — BASIC METABOLIC PANEL
BUN: 5 mg/dL — ABNORMAL LOW (ref 6–23)
CO2: 24 mEq/L (ref 19–32)
Chloride: 111 mEq/L (ref 96–112)
GFR calc Af Amer: >60 mL/min (ref >60)
Potassium: 3.7 mEq/L (ref 3.5–5.1)

## 2010-08-21 LAB — CBC
Platelets: 166 10*3/uL (ref 150–400)
RBC: 3.17 MIL/uL — ABNORMAL LOW (ref 3.87–5.11)
WBC: 5.9 10*3/uL (ref 4.0–10.5)

## 2010-08-22 LAB — CBC
HCT: 28.3 % — ABNORMAL LOW (ref 36.0–46.0)
Hemoglobin: 9.4 g/dL — ABNORMAL LOW (ref 12.0–15.0)
RBC: 3.17 MIL/uL — ABNORMAL LOW (ref 3.87–5.11)
WBC: 6.4 10*3/uL (ref 4.0–10.5)

## 2010-08-23 ENCOUNTER — Telehealth: Payer: Self-pay | Admitting: Cardiovascular Disease

## 2010-08-23 NOTE — Telephone Encounter (Signed)
Spoke with pt's husband.Patient was d/c from the hospital yesterday post  ASD prosedure. Prior D/C she had developed a large hematoma in right groin. THE AREA IS SOFT BUT IT GOT BIGGER ABOUT 1/4 INCH  SINCE YESTERDAY, AND CONTINUE TO BE PAINFUL. Patient c/o of being cold all the time, her oral temerature is 97.0 orally today.

## 2010-08-23 NOTE — Telephone Encounter (Signed)
Dr. Excell Seltzer notified, MD will call the pt himself with recommendations.

## 2010-08-23 NOTE — Telephone Encounter (Signed)
Pt had a large hematoma and it seems to getting bigger but it is still soft and she still has not had a bowel movement since day before surgery and she wants to know if she can take something

## 2010-08-24 ENCOUNTER — Emergency Department (HOSPITAL_COMMUNITY)
Admission: EM | Admit: 2010-08-24 | Discharge: 2010-08-24 | Disposition: A | Discharge status: Home / Self Care | Payer: BC Managed Care – PPO | Attending: Emergency Medicine | Admitting: Emergency Medicine

## 2010-08-24 ENCOUNTER — Emergency Department (HOSPITAL_COMMUNITY): Payer: BC Managed Care – PPO

## 2010-08-24 ENCOUNTER — Telehealth: Payer: Self-pay | Admitting: Cardiovascular Disease

## 2010-08-24 DIAGNOSIS — E860 Dehydration: Secondary | ICD-10-CM | POA: Insufficient documentation

## 2010-08-24 DIAGNOSIS — Z8679 Personal history of other diseases of the circulatory system: Secondary | ICD-10-CM | POA: Insufficient documentation

## 2010-08-24 DIAGNOSIS — R42 Dizziness and giddiness: Secondary | ICD-10-CM

## 2010-08-24 DIAGNOSIS — R55 Syncope and collapse: Secondary | ICD-10-CM | POA: Insufficient documentation

## 2010-08-24 DIAGNOSIS — J90 Pleural effusion, not elsewhere classified: Secondary | ICD-10-CM | POA: Insufficient documentation

## 2010-08-24 DIAGNOSIS — N200 Calculus of kidney: Secondary | ICD-10-CM | POA: Insufficient documentation

## 2010-08-24 LAB — URINALYSIS, ROUTINE W REFLEX MICROSCOPIC
Glucose, UA: NEGATIVE mg/dL
Nitrite: NEGATIVE
Protein, ur: NEGATIVE mg/dL
Urobilinogen, UA: 0.2 mg/dL (ref 0.0–1.0)

## 2010-08-24 LAB — POCT I-STAT, CHEM 8
BUN: 8 mg/dL (ref 6–23)
Creatinine, Ser: 0.9 mg/dL (ref 0.50–1.10)
Potassium: 3.8 mEq/L (ref 3.5–5.1)
Sodium: 139 mEq/L (ref 135–145)

## 2010-08-24 LAB — CBC
Hemoglobin: 12.5 g/dL (ref 12.0–15.0)
MCH: 29.6 pg (ref 26.0–34.0)
MCV: 88.2 fL (ref 78.0–100.0)
RBC: 4.22 MIL/uL (ref 3.87–5.11)
WBC: 9.5 10*3/uL (ref 4.0–10.5)

## 2010-08-24 LAB — DIFFERENTIAL
Basophils Relative: 0 % (ref 0–1)
Lymphs Abs: 1.4 10*3/uL (ref 0.7–4.0)
Monocytes Relative: 7 % (ref 3–12)
Neutro Abs: 7.2 10*3/uL (ref 1.7–7.7)
Neutrophils Relative %: 76 % (ref 43–77)

## 2010-08-24 LAB — URINE MICROSCOPIC-ADD ON

## 2010-08-24 LAB — POCT I-STAT TROPONIN I: Troponin i, poc: 0 ng/mL (ref 0.00–0.08)

## 2010-08-24 LAB — D-DIMER, QUANTITATIVE: D-Dimer, Quant: 1.59 ug/mL-FEU — ABNORMAL HIGH (ref 0.00–0.48)

## 2010-08-24 NOTE — Telephone Encounter (Signed)
Pt has hematoma on leg was told by dr cooper to go to er if got worse today, she is on her way there now, almost passed out this am

## 2010-08-24 NOTE — Telephone Encounter (Signed)
Trish (cardmaster) aware.

## 2010-08-28 ENCOUNTER — Telehealth: Payer: Self-pay | Admitting: Cardiovascular Disease

## 2010-08-28 NOTE — Telephone Encounter (Signed)
Spoke with pt. She is continuing to have pain in groin area. States she increased activity yesterday and had a lot of pain last evening.  Also notes a knot in area. Appointment made for pt to see [name] on August 29, 2010 at 9:00.

## 2010-08-28 NOTE — Consult Note (Signed)
NAME:  Candice Estrada, Candice Estrada             ACCOUNT NO.:  [id number]  MEDICAL RECORD NO.:  [id number]  LOCATION:  [facility]                         FACILITY:  [facility]  PHYSICIAN:  Veverly Fells. Excell Seltzer, MD  DATE OF BIRTH:  December 09, 1975  DATE OF CONSULTATION:  08/24/2010 DATE OF DISCHARGE:  08/24/2010                                CONSULTATION   PRIMARY CARDIOLOGIST:  Italy Hilty, MD with [facility].  PATIENT PROFILE:  A 35 year old female with history of atrial septal defect, status post recent percutaneous ASD closure who presented back to the ED today secondary to lightheadedness.  PROBLEMS: 1. Presyncope, questionable orthostasis. 2. Ostium secundum atrial septal defect, status post percutaneous     closure, April 20, 2010. 3. Right groin hematoma - resolving. 4. Acute blood loss anemia - stable. 5. History of TIA. 6. History of ovarian cyst. 7. History of urinary incontinence. 8. Migraine headaches. 9. Anxiety. 10.Status post thyroid cyst resection. 11.Status post tonsillectomy. 12.Status post left foot neuroma surgery in 2009.  ALLERGIES:  No known drug allergies.  HISTORY OF PRESENT ILLNESS:  A 35 year old female with prior history of left-sided weakness and numbness along with facial droop who was extensively evaluated by Neurology and subsequently followed through intracardiac shunting later diagnosed as a small ostial secundum atrial septal defect with moderate left-to-right shunting, transesophageal echocardiogram in May 2012.  She was subsequently seen by Dr. Excell Seltzer for consideration of  percutaneous closure and then underwent percutaneous closure on August 20, 2010.  Hospital course was complicated by right groin hematoma following the procedure with reduction in hemoglobin and hematocrit to 9.4 and 28.3 from 12.6 and 38 on August 15, 2010.  H and H was stable and the patient was ambulating with mild groin discomfort and she was discharged on August 22, 2010.   Since discharge, she has been feeling lightheaded and dizzy every time she stands or sits up from a lying position.  She has had worsening of the right groin pain. She has been taking Ultram as needed generally at night, but does not like the way it makes her feel.  Has  profound lightheadedness this morning during which she felt like she might pass out.  She  was taken then to the MD by her daughter.  She is mildly tachycardic with rates sometimes in the low 100s.  Orthostatic vital signs were unrevealing.  She has been treated with fluid boluses by the ER staff.  HOME MEDICATIONS: 1. Aspirin 81 mg daily. 2. Ferrous sulfate 325 mg b.i.d. 3. Plavix 75 mg daily. 4. Tramadol 50 mg q.6 h. p.r.n. 5. Multivitamin daily. 6. Vitamin B12 1000 mcg daily.  FAMILY HISTORY:  Positive for MI, CVA, hypertension, and brain cancer in her father.  Her mother died at 36 with liver disease.  She has a brother with hypertension.  SOCIAL HISTORY:  The patient works as an Public house manager.  She has never smoked and denies alcohol or drug use.  REVIEW OF SYSTEMS:  Positive for right groin pain, orthostatic presyncope, anxiety, lower abdominal discomfort, she had mild sharp chest pain this morning.  She has also been constipated and not had bowel movements since her discharge 2 days ago.  PHYSICAL EXAMINATION:  VITAL SIGNS:  Temperature 98.5, heart rate 95- 116, respirations 21, blood pressure 97/53,  pulse ox 100% on room air. GENERAL:  A pleasant white female in no acute distress, awake and oriented x3.  She has normal affect. HEENT:  Normal. NEUROLOGIC:  Grossly intact and nonfocal. SKIN:  Warm and dry without lesions or masses. NECK:  Supple without bruits or JVD. LUNGS:  Respirations are unlabored.  Clear to auscultation. CARDIAC:  Regular S1, S2.  No S3, S4, murmurs. ABDOMEN:  Round, soft with right lower quadrant tenderness.  Bowel sounds are present x4. EXTREMITIES:  Warm, dry and pink.  No clubbing,  cyanosis, or edema. Distal pulses 2+ bilaterally.  She has significant right groin ecchymosis without significant swelling or hematoma.  Ecchymosis extends medially along the right thigh.  She is quite tender in that area.  EKG shows sinus rhythm at a rate of 95, normal axis.  No acute ST-T changes.  Hemoglobin 12.5, hematocrit 37.2, WBC 9.5, platelets 235. Sodium 139, potassium 3.8, chloride 105, CO2 of 23, BUN 18, creatinine 0.9, glucose 91.  ASSESSMENT AND PLAN: 1. Presyncope.  I suspect the patient is dehydrated in the setting of     recent procedure with acute blood loss anemia and rather rapid rise     of her H and H to 12.5 and 37.2, 9.4, and 28.3 just 2 days ago.     The patient was receiving saline boluses here in the ED and we have     encouraged increased p.o. intake including  pushing softs .  She     has had constipation, which is likely compounding this.  She has     been on iron at discharge with her H and H normalizing, but we     discontinued this. 2. Right groin hematoma with rising H and H doubt any additional     bleeding.  The patient is significantly tender not just in the     groin and thigh but also now to the right lower quadrant and flank.     We will go ahead and check     a noncontrast CT of the abdomen and pelvis to rule out     retroperitoneal bleed.  Provided that this is within normal limits     and the patient improved with fluids.  I suspect she can been     discharged home today from the emergency department later this     afternoon.     Nicolasa Ducking, ANP   ______________________________ Veverly Fells. Excell Seltzer, MD    CB/MEDQ  D:  08/24/2010  T:  08/24/2010  Job:  [id number]  cc:   Italy Hilty, MD  Electronically Signed by Nicolasa Ducking ANP on 08/27/2010 03:12:31 PM Electronically Signed by Tonny Bollman MD on 08/28/2010 05:25:13 AM

## 2010-08-28 NOTE — Discharge Summary (Signed)
NAME:  Candice Estrada, Candice Estrada             ACCOUNT NO.:  [id number]  MEDICAL RECORD NO.:  [id number]  LOCATION:  6523                         FACILITY:  [facility]  PHYSICIAN:  Veverly Fells. Excell Seltzer, MD  DATE OF BIRTH:  1975/09/09  DATE OF ADMISSION:  08/20/2010 DATE OF DISCHARGE:  08/22/2010                              DISCHARGE SUMMARY   PRIMARY CARDIOLOGIST:  Italy Hilty, MD with [facility].  DISCHARGE DIAGNOSIS:  Ostium secundum atrial septal defect, status post percutaneous closure this admission.  SECONDARY DIAGNOSES: 1. History of transient ischemic attacks. 2. Right groin hematoma. 3. Acute blood loss anemia. 4. History of ovarian cyst. 5. History of urinary incontinence. 6. Migraine headaches. 7. Anxiety. 8. Status post thyroid cyst resection. 9. Status post tonsillectomy. 10.Status post left foot neuroma surgery in 2009.  ALLERGIES:  No known drug allergies.  PROCEDURES: 1. Successful percutaneous closure of a small ostium secundum atrial     septal defect using an Amplatzer septal occluder, serial number     [id number]. 2. CT of the brain without contrast showing normal noncontrast CT     appearance of the brain.  HISTORY OF PRESENT ILLNESS:  A 35 year old female with prior history of left-sided weakness and numbness along with facial droop, facial numbness who had undergone extensive neurologic workup including CT and MRI in February of this year.  Evaluation was negative.  Followup transcranial Dopplers were strongly suggestive of intracardiac shunt and she subsequently underwent transesophageal echocardiogram on Jun 08, 2010, showing a small ostial secundum atrial septal defect with moderate left-to-right shunting by color Doppler and normal LV function.  She was referred to see Dr. Tonny Estrada in the office on July 31, 2010, for consideration of percutaneous closure and this was felt to be appropriate.  HOSPITAL COURSE:  The patient presented to  the [facility] on August 20, 2010, where she underwent successful percutaneous closure of ostium secundum ASD with an Amplatzer septal occluder.  She tolerated this procedure well, but procedure was complicated by right groin hematoma.  Complete blood count evaluated following procedure showed a drop in her H and H to 10 and 31 respectively down from 12.6 and 38 on August 15, 2010.  Hematoma was successfully compressed and she has had no additional complications related to the hematoma, though she is mildly tender.  We did keep her one additional day to continue to monitor her hemoglobin and hematocrit, which have been stable on the 9 and 28 range. Because of her anemia, however, we are initiating iron therapy at time of discharge.  Postprocedure, the patient also complained of headache with nausea. These have since resolved.  The head CT was performed on August 21, 2010 showing normal exam while 2-D echocardiogram performed on August 21, 2010 showed normal LV function with no residual ASD.  [name] has been ambulating without recurrent symptoms or limitations and will be discharged home today in good condition.  DISCHARGE LABS:  Hemoglobin 9.4, hematocrit 28.3, WBC 6.4, platelets 162.  Sodium 141, potassium 3.7, chloride 111, CO2 24, BUN 5, creatinine 0.63, glucose 95, calcium 7.9.  HCG was negative.  DISPOSITION:  The patient will be discharged home today in  good condition.  FOLLOWUP PLANS AND APPOINTMENTS:  The patient has follow up with [facility] on September 19, 2010 at 1 p.m. for a 2-D echocardiogram.  She will follow up Dr. Italy Hilty on September 28, 2010 at 2:15 p.m.  DISCHARGE MEDICATIONS: 1. Ferrous sulfate 325 mg b.i.d. 2. Tramadol 50 mg q.6 h. p.r.n. 3. Aspirin 81 mg daily. 4. Multivitamin one tablet daily. 5. Plavix 75 mg daily. 6. Vitamin B12 1000 mcg daily.  OUTSTANDING LAB STUDIES:  Follow up echocardiogram on September 19, 2010.  DURATION OF DISCHARGE ENCOUNTER:  Forty five minutes including physician time.     Candice Estrada, ANP   ______________________________ Veverly Fells. Excell Seltzer, MD    CB/MEDQ  D:  08/22/2010  T:  08/22/2010  Job:  [id number]  cc:   Italy Hilty, MD  Electronically Signed by Candice Estrada ANP on 08/24/2010 03:36:27 PM Electronically Signed by Candice Bollman MD on 08/28/2010 05:25:03 AM

## 2010-08-28 NOTE — Cardiovascular Report (Signed)
NAME:  Candice Estrada, Candice Estrada             ACCOUNT NO.:  [id number]  MEDICAL RECORD NO.:  [id number]  LOCATION:  6523                         FACILITY:  [facility]  PHYSICIAN:  Veverly Fells. Excell Seltzer, MD  DATE OF BIRTH:  03/26/1975  DATE OF PROCEDURE: DATE OF DISCHARGE:                           CARDIAC CATHETERIZATION   PROCEDURES: 1. Intracardiac echocardiography. 2. Transcatheter ASD closure.  PROCEDURAL INDICATIONS:  [name] is a 35 year old woman who presented with a TIA in February 2012.  She was ultimately diagnosed with a small secundum ASD by transesophageal echo.  The patient was referred for transcatheter closure.  After review of risks, indications, and alternatives to transcatheter ASD closure, we elected to proceed.  Risks and indication of the procedure were reviewed with the patient and family in detail.  Informed consent was obtained.  The right groin was prepped, draped, and anesthetized with 1% lidocaine.  Venous access was difficult initially.  An 11-French sheath was placed in the right femoral vein with plans of using that sheath for the 10-French intracardiac echo probe.  I had difficulty finding a second venous access on the right side.  Eventually, I did access the right femoral vein again above the site of initial access.  An 8-French sheath was placed without difficulty.  However, I was unable to advance the intracardiac echo probe through the 11-French sheath.  This was imaged under fluoroscopy and both sheath entry sites appeared very close together.  I was able to pass a wire through the 11-French sheath and when I pulled the sheath back, it was clear that there was a puncture through the side of the sheath material.  There was also tension on the second sheath when the first sheath was pulled back or moved forward.  I suspected that the second sheath went through the first sheath.  This actually was the case and the second sheath was removed while  hemostasis was obtained over the femoral venous site.  The 11-French sheath was then removed over the wire so that that access site could be preserved. A new 11-French sheath was placed and there was no bleeding around the sheath site.  Manual pressure was held for several minutes over the second access site to obtain hemostasis.  Hemostasis was eventually obtained and there was a small groin hematoma present.  Since the access site was venous and there had already been 4000 units of heparin given, I felt it was best to move to the other side in order to complete the procedure and obtain access for the left femoral venous sheath.  The left groin was reprepped.  Lidocaine was administered and access was obtained via the modified Seldinger technique.  An 8-French sheath was placed without difficulty.  At that point, the intracardiac echo probe was introduced and initial imaging was done.  An ACT was obtained and the ACT was 205.  An additional 1000 units of heparin was administered. The ASD was crossed using an angled Glidewire and a multipurpose catheter without difficulty.  Access was obtained into the left upper pulmonary vein where an Amplatz wire was exchanged and the multipurpose catheter was removed.  A sizing balloon was advanced across the atrial septum and using  a 3:1 mixture of saline contrast, the balloon was inflated for "stop-flow."  This was measured carefully using both intracardiac echo and fluoroscopy.  The defect diameter measured 10 mm. Calibration was confirmed based on the sizing balloon parameters as per protocol with 15 mm between inner and outer markers.  A 10-mm atrial septal occluder device was prepped on the table under normal protocol. The sizing balloon was removed and an 8-French introducer sheath was advanced across the inner atrial septum.  The device was introduced through the sheath and a left disk was deployed.  All of this was done under the guidance of  intracardiac echo.  The device was brought back so that it was snug against the inner atrial septum and then the right disk was deployed.  Position was confirmed by intracardiac echo and fluoroscopy.  The device was released and there was an excellent result at the completion of the procedure with good device position.  The patient tolerated the entire procedure well.  The long sheath in the left femoral vein was changed out for a short 8-French sheath.  The intracardiac echo probe was removed and the 11-French femoral venous sheath in the right femoral vein was left in place.  Both sheaths were sutured into place and the patient will be transferred to the postcath recovery area in stable condition.  Of note, the patient was anxious throughout much of the procedure and required a great deal of sedation. She was given a total of 12 mg of Versed and 300 mg of fentanyl during the procedure.  She had been adequately pretreated with both aspirin and Plavix.  FINAL CONCLUSIONS:  Successful transcatheter closure of a secundum atrial septal defect using a 10-mm Amplatzer septal occluder device.  RECOMMENDATIONS:  The patient will be monitored carefully.  She does have a right groin hematoma that will have to be monitored and a CBC will be checked later today.  We will plan on overnight observation with a chest x-ray and limited echocardiogram in the morning.     Veverly Fells. Excell Seltzer, MD     MDC/MEDQ  D:  08/20/2010  T:  08/20/2010  Job:  [id number]  cc:   Pramod P. Pearlean Brownie, MD Italy Hilty, MD  Electronically Signed by Tonny Bollman MD on 08/28/2010 05:24:51 AM

## 2010-08-28 NOTE — Telephone Encounter (Signed)
Pt has knot where procedure was done.  Hematoma, seen in ED last week.  No is hard in groin area, a lot of bruising, very painful.  Would like someone to look at this.  Please call her as soon as possible.

## 2010-08-29 ENCOUNTER — Encounter: Payer: Self-pay | Admitting: Physician Assistant

## 2010-08-29 ENCOUNTER — Ambulatory Visit (INDEPENDENT_AMBULATORY_CARE_PROVIDER_SITE_OTHER): Payer: BC Managed Care – PPO | Admitting: Physician Assistant

## 2010-08-29 ENCOUNTER — Encounter: Payer: Self-pay | Admitting: *Deleted

## 2010-08-29 ENCOUNTER — Encounter (INDEPENDENT_AMBULATORY_CARE_PROVIDER_SITE_OTHER): Payer: BC Managed Care – PPO | Admitting: Cardiology

## 2010-08-29 ENCOUNTER — Ambulatory Visit (INDEPENDENT_AMBULATORY_CARE_PROVIDER_SITE_OTHER)
Admission: RE | Admit: 2010-08-29 | Discharge: 2010-08-29 | Disposition: A | Discharge status: Home / Self Care | Payer: BC Managed Care – PPO | Source: Ambulatory Visit | Attending: Physician Assistant | Admitting: Physician Assistant

## 2010-08-29 VITALS — BP 103/73 | HR 107 | Ht 63.0 in | Wt 136.0 lb

## 2010-08-29 DIAGNOSIS — T148XXA Other injury of unspecified body region, initial encounter: Secondary | ICD-10-CM

## 2010-08-29 DIAGNOSIS — M79609 Pain in unspecified limb: Secondary | ICD-10-CM

## 2010-08-29 LAB — CBC WITH DIFFERENTIAL/PLATELET
Basophils Absolute: 0 10*3/uL (ref 0.0–0.1)
Eosinophils Absolute: 0.1 10*3/uL (ref 0.0–0.7)
HCT: 32.9 % — ABNORMAL LOW (ref 36.0–46.0)
Lymphs Abs: 1.1 10*3/uL (ref 0.7–4.0)
MCHC: 33.4 g/dL (ref 30.0–36.0)
MCV: 89.8 fl (ref 78.0–100.0)
Monocytes Absolute: 0.4 10*3/uL (ref 0.1–1.0)
Neutrophils Relative %: 73.8 % (ref 43.0–77.0)
Platelets: 311 10*3/uL (ref 150.0–400.0)
RDW: 14.5 % (ref 11.5–14.6)
WBC: 6.3 10*3/uL (ref 4.5–10.5)

## 2010-08-29 NOTE — Progress Notes (Signed)
History of Present Illness: Primary [place] Cardiologist: Dr. Tonny Bollman Primary Cardiologist: Dr. Italy Hilty at [facility]  Candice Estrada is a 35 y.o. female with a of ostium secundum ASD. The patient presented February 27 with sudden onset of dizziness accompanied with left-sided weakness and numbness. She was also noted to have left facial droop and facial numbness. The patient was hospitalized and underwent extensive workup including CT and MRI of the brain.  She underwent an MRA.  This evaluation was negative.  In followup, she underwent a transcranial Doppler study which showed a very strongly positive intracardiac shunt.  Patient ultimately underwent transesophageal echocardiogram by Dr. Rennis Golden on May 25 and this demonstrated a small ostium secundum ASD with moderate left to right shunting by color Doppler with normal left ventricular function and normal pulmonary venous return. There were no significant valvular lesions.   Decision was ultimately made to proceed with ASD closure and this was performed on 08/20/10 within Amplatzer septal occluder with Dr. Excell Seltzer.  This procedure was complicated by right groin hematoma.  The patient suffered acute blood loss anemia with hemoglobin dropping from 12.6 to10 and then 9.  Her hematoma was successfully compressed.  She was started on iron.  Post procedure echo 8/7: EF 55-60%, grade 2 diastolic dysfunction and no residual ASD.  Her hemoglobin stabilized and she was eventually discharged home on 8/8 2012.  She presented back to the emergency room on 8/10 2012 with complaints of lightheadedness.  Cardiology evaluated the patient.  She had abdominal and pelvic CT that demonstrated subcutaneous edema likely reflecting bruising in the bilateral groin regions, right greater than the left but no large hematoma identified.  She had trace pelvic ascites and small bilateral pleural pleural effusions and a nonobstructive 2 mm right mid kidney  stone.  Hemoglobin had increased to 13.5.  Potassium is 3.8 and creatinine 0.9.  D-dimer was obtained and was 1.59.  She was felt to be dehydrated in the setting of recent procedure with acute blood loss anemia and rapid rise of her H&H from 9.4-12.5 in 2 days.  Hydration was recommended.  She called in to the office yesterday with recurrent pain in her groin was added onto my schedule today.  She notes a hard area that has come up in the last couple of days.  This was not present when she had her CT a few days ago.  It is very painful.  She does not like to extend her knee fully.  She feels nauseated from the pain.  She has taken Ultram with minimal relief.  She has some minimal back pain.  She denies any difficulty with her bowels.  She denies any dysuria.  She has had a temperature of 99.51F.  She denies chills or cough.  She denies syncope.  She denies shortness of breath patient denies chest pain.  Past Medical History  Diagnosis Date  . Ovarian cyst   . Urinary incontinence   . Migraine headache   . Anxiety   . ASD (atrial septal defect), ostium secundum     Status post closure 08/20/10 with Dr. Excell Seltzer; procedure complicated by right groin hematoma and acute blood loss anemia  . History of TIAs     Current Outpatient Prescriptions  Medication Sig Dispense Refill  . aspirin 81 MG tablet Take 81 mg by mouth daily.        . clopidogrel (PLAVIX) 75 MG tablet Take 1 tablet (75 mg total) by mouth daily.  30  tablet  11  . ferrous sulfate 325 (65 FE) MG tablet Take 325 mg by mouth.        . furosemide (LASIX) 20 MG tablet Take 20 mg by mouth as needed.        . multivitamin (THERAGRAN) per tablet Take 1 tablet by mouth daily.        . vitamin B-12 (CYANOCOBALAMIN) 1000 MCG tablet Take 1,000 mcg by mouth daily. 2-3 times weekly        Allergies: No Known Allergies  Vital Signs: BP 103/73  Pulse 107  Ht 5\' 3"  (1.6 m)  Wt 136 lb (61.689 kg)  BMI 24.09 kg/m2  LMP 08/03/2010  Filed Vitals:     08/29/10 0949 08/29/10 0950 08/29/10 0951 08/29/10 0952  BP: 101/66 99/67 99/78  103/73  Pulse: 84 104 109 107  Height:      Weight:         PHYSICAL EXAM: Well nourished, well developed, in no acute distress HEENT: normal Neck: supple Cardiac:  normal S1, S2; RRR Lungs:  clear to auscultation bilaterally, no wheezing, rhonchi or rales Abd: soft, nontender Ext: Right groin with extensive bruising extending from the groin to the medial proximal thigh; there is a moderate sized approximately the size of a racquetball that is exquisitely tender to just light touch.  Left groin is stable.  No bruit auscultated bilaterally. Skin: warm and dry Neuro:  CNs 2-12 intact, no focal abnormalities noted  ASSESSMENT AND PLAN:

## 2010-08-29 NOTE — Patient Instructions (Addendum)
CT ABDOMEN/PELVIS W/O CONTRAST TODAY WITH ROSE IN CT OK PER ROSE DX 924.9 RIGHT GROIN HEMATOMA FROM CATH.  PRESCRIPTION FOR VICODIN 5/500 1-2 TABLETS EVERY 6-8 HOURS PRN.  YOU HAVE ALSO BEEN GIVEN A WORK NOTE TO STAY OUT UNTIL FURTHER FOLLOW UP WITH DR. Excell Seltzer IN 1 WEEK  Your physician has requested that you have a lower  extremity arterial duplex. This test is an ultrasound of the arteries in the legs . It looks at arterial blood flow in the legs and arms. Allow one hour for Lower and Upper Arterial scans. There are no restrictions or special instructions  TODAY  WITH Candice Estrada  DX HEMATOMA

## 2010-08-29 NOTE — Assessment & Plan Note (Signed)
She did not really have signs of a large hematoma on her CT scan 5 days ago.  She now has a hardened area in her right groin this is consistent with a hematoma.  I was able to discuss her case over the telephone with Dr. Excell Seltzer who did her ASD closure.  We will get a right groin ultrasound today to make sure that there is no pseudoaneurysm or AV fistula.  We will also get another CT scan without contrast of her abdomen and pelvis to make sure she does not have a retroperitoneal hematoma.  I will get a stat CBC today.  I will give her a prescription for Vicodin 5/500 mg one to 2 p.o. Every 6-8 hours p.r.n. #30 without refills.  We will bring her back in close follow up within the next week.  I will give her a note to remain out of work until that time.  Of note, her blood pressure does not drop with standing.  However, she does have an increased heart rate of about 20 points.  She is advised to push p.o. Fluids and salt to help replete her volume.

## 2010-08-30 ENCOUNTER — Encounter: Payer: Self-pay | Admitting: *Deleted

## 2010-08-30 ENCOUNTER — Telehealth: Payer: Self-pay | Admitting: Cardiovascular Disease

## 2010-08-30 NOTE — Telephone Encounter (Signed)
Pt would like CT results and she started her cycle 12 days early and was wondering if that may have something to do with her pfo closure or ct

## 2010-08-30 NOTE — Telephone Encounter (Signed)
Hi Ladies in triage, I was going to cb pt about CT results and saw that she has a question that I know needed to be passed on to you girls (nurses). Would you please call this pt and help answer her question and please give her her CT results. I thought I would just try and get everything answered for her in 1 call.  Thank you Danielle Rankin, CMA

## 2010-08-30 NOTE — Telephone Encounter (Signed)
Pt calling wondering what CT showed--advised just some inflammation in thigh(pt states had pfo closure 8/6 and bruising from that procedure ) but nothing else seen--has now started ? menses and is to have uterine ablation soon--was wondering if CT showed anything that would indicate why she was bleeding  vaginally --advised nothing seen to cause bleeding and pt states had IUD put in and taken out due to heavy menses and now waiting for ablation--advised may have started menses due to recent cardiac surgery or worry over bleeding--pt agrees--nt

## 2010-08-31 ENCOUNTER — Telehealth: Payer: Self-pay | Admitting: *Deleted

## 2010-08-31 NOTE — Telephone Encounter (Signed)
pt aware of ct and lab. Candice Estrada

## 2010-09-06 ENCOUNTER — Encounter: Payer: Self-pay | Admitting: *Deleted

## 2010-09-06 ENCOUNTER — Encounter: Payer: Self-pay | Admitting: Physician Assistant

## 2010-09-06 ENCOUNTER — Ambulatory Visit (INDEPENDENT_AMBULATORY_CARE_PROVIDER_SITE_OTHER): Payer: BC Managed Care – PPO | Admitting: Physician Assistant

## 2010-09-06 VITALS — BP 115/78 | HR 113 | Ht 65.0 in | Wt 132.0 lb

## 2010-09-06 DIAGNOSIS — R002 Palpitations: Secondary | ICD-10-CM | POA: Insufficient documentation

## 2010-09-06 DIAGNOSIS — T148XXA Other injury of unspecified body region, initial encounter: Secondary | ICD-10-CM

## 2010-09-06 NOTE — Progress Notes (Addendum)
History of Present Illness: Primary [place] Cardiologist: Dr. Tonny Bollman Primary Cardiologist: Dr. Italy Hilty at [facility]  Candice Estrada is a 35 y.o. female with a of ostium secundum ASD. The patient presented February 27 with sudden onset of dizziness accompanied with left-sided weakness and numbness. She was also noted to have left facial droop and facial numbness. The patient was hospitalized and underwent extensive workup including CT and MRI of the brain.  She underwent an MRA.  This evaluation was negative.  In followup, she underwent a transcranial Doppler study which showed a very strongly positive intracardiac shunt.  Patient ultimately underwent transesophageal echocardiogram by Dr. Rennis Golden on May 25 and this demonstrated a small ostium secundum ASD with moderate left to right shunting by color Doppler with normal left ventricular function and normal pulmonary venous return. There were no significant valvular lesions.   Decision was ultimately made to proceed with ASD closure and this was performed on 08/20/10 within Amplatzer septal occluder with Dr. Excell Seltzer.  This procedure was complicated by right groin hematoma.  The patient suffered acute blood loss anemia with hemoglobin dropping from 12.6 to10 and then 9.  Her hematoma was successfully compressed.  She was started on iron.  Post procedure echo 8/7: EF 55-60%, grade 2 diastolic dysfunction and no residual ASD.  Her hemoglobin stabilized and she was eventually discharged home on 8/8 2012.    I saw her last week with severe groin pain.  I had her get a right groin ultrasound and an abdominal and pelvic CT.  Both of these were unremarkable.  Her Hgb was also stable on CBC.  She was given Vicodin to take PRN.  She notes today that she feels much better.  Groin pain is better.  She can walk easier now.  She does note palpitations and dizziness.  Orthostatic VS today demonstrate increased HR from lying to standing, but her  BPs are stable.  ECG is normal.  Past Medical History  Diagnosis Date  . Ovarian cyst   . Urinary incontinence   . Migraine headache   . Anxiety   . ASD (atrial septal defect), ostium secundum     Status post closure 08/20/10 with Dr. Excell Seltzer; procedure complicated by right groin hematoma and acute blood loss anemia  . History of TIAs     Current Outpatient Prescriptions  Medication Sig Dispense Refill  . aspirin 81 MG tablet Take 81 mg by mouth daily.        . clopidogrel (PLAVIX) 75 MG tablet Take 1 tablet (75 mg total) by mouth daily.  30 tablet  11  . ferrous sulfate 325 (65 FE) MG tablet Take 325 mg by mouth.        . furosemide (LASIX) 20 MG tablet Take 20 mg by mouth as needed.        . multivitamin (THERAGRAN) per tablet Take 1 tablet by mouth daily.        . vitamin B-12 (CYANOCOBALAMIN) 1000 MCG tablet Take 1,000 mcg by mouth daily. 2-3 times weekly        Allergies: No Known Allergies  Vital Signs: BP 115/78  Pulse 113  Ht 5\' 5"  (1.651 m)  Wt 132 lb (59.875 kg)  BMI 21.97 kg/m2  LMP 08/03/2010  Filed Vitals:   09/06/10 1455 09/06/10 1456 09/06/10 1458 09/06/10 1501  BP: 101/68 102/73 109/77 115/78  Pulse: 88 105 109 113  Height:      Weight:  PHYSICAL EXAM: Well nourished, well developed, in no acute distress HEENT: normal Neck: supple Cardiac:  normal S1, S2; RRR Lungs:  clear to auscultation bilaterally, no wheezing, rhonchi or rales Abd: soft, nontender Ext: Right groin decreased bruising extending from the groin to the medial proximal thigh; No hematoma Skin: warm and dry Neuro:  CNs 2-12 intact, no focal abnormalities noted  EKG:  NSR, HR 87, normal axis, no acute changes.  ASSESSMENT AND PLAN:

## 2010-09-06 NOTE — Patient Instructions (Signed)
Your physician recommends that you schedule a follow-up appointment in: AS NEEDED WITH DR. Excell Seltzer  INCREASE YOUR FLUID INTAKE AND SALT INTAKE, TRY DRINKING GATORADE.  CALL DR. HILTY IF PALPITATIONS OR DIZZINESS ARE NOT ANY BETTER.

## 2010-09-06 NOTE — Assessment & Plan Note (Signed)
Improved.  Reassurance today.  She can follow up with Dr. Excell Seltzer prn.  She will have follow up echos with Dr. Rennis Golden.  I have given her a note to return to work.

## 2010-09-06 NOTE — Assessment & Plan Note (Signed)
With her HR increase from lying to standing,  I suspect she is volume deplete.  She may even have POTS.  She does have a h/o syncope.  I have asked her to increase her fluids until her urine is clear.  She has also been asked to drink electrolyte drinks and add salt to her foods.  She should follow up with Dr. Rennis Golden for further evaluation as necessary.

## 2010-09-10 ENCOUNTER — Ambulatory Visit (INDEPENDENT_AMBULATORY_CARE_PROVIDER_SITE_OTHER): Payer: BC Managed Care – PPO | Admitting: Family Medicine

## 2010-09-10 ENCOUNTER — Encounter: Payer: Self-pay | Admitting: Family Medicine

## 2010-09-10 VITALS — BP 86/68 | HR 126 | Resp 15 | Ht 65.0 in | Wt 139.4 lb

## 2010-09-10 DIAGNOSIS — Q2111 Secundum atrial septal defect: Secondary | ICD-10-CM

## 2010-09-10 DIAGNOSIS — I959 Hypotension, unspecified: Secondary | ICD-10-CM | POA: Insufficient documentation

## 2010-09-10 DIAGNOSIS — R42 Dizziness and giddiness: Secondary | ICD-10-CM | POA: Insufficient documentation

## 2010-09-10 DIAGNOSIS — Q211 Atrial septal defect: Secondary | ICD-10-CM

## 2010-09-10 MED ORDER — MECLIZINE HCL 25 MG PO TABS: 25.0000 mg | ORAL_TABLET | Freq: Three times a day (TID) | ORAL | Status: DC | PRN

## 2010-09-10 NOTE — Assessment & Plan Note (Signed)
She has ongoing hypotension, her last Hb was up to 11. Cardiology suggest possible POTS, at this time will have her f/u with cards next week as scheduled, consider Midodrine.

## 2010-09-10 NOTE — Assessment & Plan Note (Addendum)
Persistent dizziness with disorientation and weakness, differentials BPV, hypovolemia, POTS or other neurological disorder. She has seen cardiology they have pushed electrolytes and salt addition. I do not think it is safe for her to return to work at this time, with her hypotension and continued symptoms.Will give trial of meclizine. Arrange Neuro f/u for recheck
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
## 2010-09-10 NOTE — Assessment & Plan Note (Signed)
S/p intervention, defer to cards

## 2010-09-10 NOTE — Patient Instructions (Signed)
Start the Meclizine for dizziness Continue with addition of salts to foods and try things like Gatorade to keep your blood pressure up We will schedule a quick follow-up with neurology Please remain out of work until then If you have worsening chest pain, dizziness, SOB, then go to ER Please follow-up after being seen by Neurology

## 2010-09-12 ENCOUNTER — Telehealth: Payer: Self-pay

## 2010-09-12 NOTE — Telephone Encounter (Signed)
Appt with Dr Meryl Dare at [facility] neurology at 2pm

## 2010-09-14 ENCOUNTER — Encounter: Payer: Self-pay | Admitting: Family Medicine

## 2010-09-14 ENCOUNTER — Ambulatory Visit (INDEPENDENT_AMBULATORY_CARE_PROVIDER_SITE_OTHER): Payer: BC Managed Care – PPO | Admitting: Family Medicine

## 2010-09-14 DIAGNOSIS — R42 Dizziness and giddiness: Secondary | ICD-10-CM

## 2010-09-14 DIAGNOSIS — I951 Orthostatic hypotension: Secondary | ICD-10-CM

## 2010-09-14 DIAGNOSIS — R000 Tachycardia, unspecified: Secondary | ICD-10-CM

## 2010-09-14 DIAGNOSIS — I498 Other specified cardiac arrhythmias: Secondary | ICD-10-CM

## 2010-09-14 DIAGNOSIS — G90A Postural orthostatic tachycardia syndrome (POTS): Secondary | ICD-10-CM

## 2010-09-14 DIAGNOSIS — G459 Transient cerebral ischemic attack, unspecified: Secondary | ICD-10-CM

## 2010-09-14 NOTE — Patient Instructions (Signed)
Continue the Meclizine as needed Continue all other medications You may return to work on Tuesday Follow-up as needed

## 2010-09-14 NOTE — Progress Notes (Signed)
  Subjective:    Patient ID: Calani Gick, female    DOB: 05-27-1975, 35 y.o.   MRN: [id number]  HPI    Seen by Dr. Franchot Heidelberg- states she has POTS, had a bubble study, he is trying Lexapro also told to increase your sodium, next visit in 3 months      TIA- seen by Dr. Ezequiel Essex has set up MRI/MRA on Wed Sept 6th, will continue plavix, will now be seen monthly, plan to have another TEE to look for another clot , they think the device is looks okay for her ASD closure but she still has some leaking       Feels she is ready to return to work. Feeling okay right now.   Will wait for [employer] insurance paper   Review of Systems         GEN- no fever, +fatigue         CVS- no chest pain, +palpitations         Neuro- no syncopy, +weakness    Objective:   Physical Exam GEN- NAD, alert and oriented, good color  CVS- RRR, no murmur  RESP- CTAB  Gait- steady, does not appear to be off balance or dizzy today.       Assessment & Plan:

## 2010-09-15 DIAGNOSIS — I498 Other specified cardiac arrhythmias: Secondary | ICD-10-CM | POA: Insufficient documentation

## 2010-09-15 DIAGNOSIS — G459 Transient cerebral ischemic attack, unspecified: Secondary | ICD-10-CM | POA: Insufficient documentation

## 2010-09-15 DIAGNOSIS — R000 Tachycardia, unspecified: Secondary | ICD-10-CM | POA: Insufficient documentation

## 2010-09-15 DIAGNOSIS — G90A Postural orthostatic tachycardia syndrome (POTS): Secondary | ICD-10-CM | POA: Insufficient documentation

## 2010-09-15 NOTE — Assessment & Plan Note (Signed)
Recurrent TIA, pt to have imaging next week. No new focal deficits

## 2010-09-15 NOTE — Assessment & Plan Note (Signed)
Per cardiology, started on lexapro, cardiologist per report feels she has a component of anxiety as well

## 2010-09-15 NOTE — Assessment & Plan Note (Signed)
Improved today. Meclizine prn has helped. Pt to return to work

## 2010-09-26 ENCOUNTER — Encounter: Payer: Self-pay | Admitting: Physician Assistant

## 2010-10-01 ENCOUNTER — Telehealth: Payer: Self-pay | Admitting: *Deleted

## 2010-10-01 NOTE — Telephone Encounter (Signed)
Paperwork completed. Medical records to mail to pt.

## 2010-10-01 NOTE — Telephone Encounter (Signed)
Called pt to get information from her so disability paperwork could be completed. Left message to call back

## 2010-10-01 NOTE — Telephone Encounter (Signed)
Spoke with pt. She returned to work on Sept. 4, 2012.  Paperwork will be mailed to her home when completed per her request.

## 2010-10-01 NOTE — Telephone Encounter (Signed)
Copy Mailed to PT  10/01/10/km

## 2010-10-11 ENCOUNTER — Other Ambulatory Visit (HOSPITAL_COMMUNITY)
Admission: RE | Admit: 2010-10-11 | Discharge: 2010-10-11 | Disposition: A | Discharge status: Home / Self Care | Payer: BC Managed Care – PPO | Source: Ambulatory Visit | Attending: Obstetrics and Gynecology | Admitting: Obstetrics and Gynecology

## 2010-10-11 ENCOUNTER — Other Ambulatory Visit: Payer: Self-pay | Admitting: Adult Health

## 2010-10-11 DIAGNOSIS — Z01419 Encounter for gynecological examination (general) (routine) without abnormal findings: Secondary | ICD-10-CM | POA: Insufficient documentation

## 2010-10-12 ENCOUNTER — Encounter: Payer: Self-pay | Admitting: Family Medicine

## 2010-10-12 ENCOUNTER — Ambulatory Visit (INDEPENDENT_AMBULATORY_CARE_PROVIDER_SITE_OTHER): Payer: BC Managed Care – PPO | Admitting: Family Medicine

## 2010-10-12 VITALS — BP 82/62 | HR 74 | Resp 16 | Ht 65.0 in | Wt 139.0 lb

## 2010-10-12 DIAGNOSIS — K2970 Gastritis, unspecified, without bleeding: Secondary | ICD-10-CM

## 2010-10-12 MED ORDER — DEXLANSOPRAZOLE 30 MG PO CPDR: 30.0000 mg | DELAYED_RELEASE_CAPSULE | Freq: Every day | ORAL | Status: AC

## 2010-10-12 NOTE — Progress Notes (Signed)
  Subjective:    Patient ID: Candice Estrada, female    DOB: [birth year], 35 y.o.   MRN: [id number]  HPI  2 week history of epigatric and LUQ pain  very similar to her history of gastritis and H pylori. She has the same problem 1 year ago, however did not take the meds prescribed because she had a stroke.    ROS- She denies any change in stool,denies emesis, +nausea, +burning sensation, +belching, no blood in stools, no dysuria, no vaginal discharge, no specific foods causing episodes    Review of Systems - per above     Objective:   Physical Exam   GEN- NAD, alert and oriented  GI- NABS, soft, mild TTP in epigastric and LUQ, no rebound, no gaurding, no masses, no suprapubic pressure, no CVA tenderness.  CVS- RRR      Assessment & Plan:

## 2010-10-12 NOTE — Patient Instructions (Signed)
Start the Dexilant for your acid reflux Get your H pylori drawn I will call with results  Avoid any anti-inflammatories (Aleve, Ibuprofen) Avoid spicy foods, fatty foods, red sauce

## 2010-10-14 NOTE — Assessment & Plan Note (Signed)
Likley gasritis, based on symptoms, start PPI- pt is on plavix therefore Dexilant used. H pylori antigen sent off. If positive treat 14-21 days, if this does not improve will send to GI, discussed foods to avoid at this time

## 2010-10-15 LAB — H. PYLORI ANTIBODY, IGG: H Pylori IgG: 3.72 {ISR} — ABNORMAL HIGH

## 2010-10-16 ENCOUNTER — Telehealth: Payer: Self-pay | Admitting: Family Medicine

## 2010-10-16 MED ORDER — CLARITHROMYCIN 500 MG PO TABS: 500.0000 mg | ORAL_TABLET | Freq: Two times a day (BID) | ORAL | Status: AC

## 2010-10-16 MED ORDER — AMOXICILLIN 500 MG PO CAPS: 1000.0000 mg | ORAL_CAPSULE | Freq: Two times a day (BID) | ORAL | Status: AC

## 2010-10-16 NOTE — Telephone Encounter (Signed)
Please call pt , cell phone above and let her know H.Pylori was positive. She needs to start Amoxicillin twice a day and Clarithromycin twice a day, because of her Plavix I could not give her the pill that has all three medications in 1. She should continue the acid blocker medication

## 2010-10-17 NOTE — Telephone Encounter (Signed)
Patient aware.

## 2010-10-17 NOTE — Telephone Encounter (Signed)
Called cell and left message

## 2010-10-31 ENCOUNTER — Telehealth: Payer: Self-pay

## 2010-10-31 NOTE — Telephone Encounter (Signed)
Patient aware.

## 2010-10-31 NOTE — Telephone Encounter (Signed)
Called back, voicemail has not been set up yet

## 2010-10-31 NOTE — Telephone Encounter (Signed)
Will finish her meds for H pylori tonight and wants to know if she needs to be retested or follow up again or just assume its gone.

## 2010-10-31 NOTE — Telephone Encounter (Signed)
She does not need repeat blood testing. If her symptoms re-occur then we can set her up with GI

## 2010-11-30 ENCOUNTER — Encounter: Payer: Self-pay | Admitting: Family Medicine

## 2010-11-30 ENCOUNTER — Ambulatory Visit: Payer: BC Managed Care – PPO | Admitting: Family Medicine

## 2010-12-26 ENCOUNTER — Telehealth: Payer: Self-pay | Admitting: Cardiovascular Disease

## 2010-12-26 NOTE — Telephone Encounter (Signed)
Patient called,complaining of rt groin very sore since ASD closure 08/20/10.States so sore will bring tears.Spoke with Dr. Casey Burkitt groin was ultrasound,and pelvic Ct was unremarkable.Dr.Cooper said he would be glad to see patient.Appointment was offered to patient, she declined.States she will wait a little longer.

## 2010-12-26 NOTE — Telephone Encounter (Signed)
New message:  She would like to have a doppler/ultrasound of the area she had just spoke to you about.  Please call and advise.

## 2010-12-26 NOTE — Telephone Encounter (Signed)
Patient called back wants appointment with Dr. Excell Seltzer.Appointment scheduled with Dr. Excell Seltzer 01/30/11 at 10:45 am.

## 2010-12-26 NOTE — Telephone Encounter (Signed)
New Msg: pt calling stating that groin area is very tender following ASD closure on August 6th. Pt wanted to discuss this with MD/nurse. Please return pt call to discuss further.

## 2011-01-30 ENCOUNTER — Ambulatory Visit: Payer: BC Managed Care – PPO | Admitting: Cardiovascular Disease

## 2011-01-31 ENCOUNTER — Emergency Department (HOSPITAL_COMMUNITY): Payer: BC Managed Care – PPO

## 2011-01-31 ENCOUNTER — Emergency Department (HOSPITAL_COMMUNITY)
Admission: EM | Admit: 2011-01-31 | Discharge: 2011-01-31 | Disposition: A | Discharge status: Home / Self Care | Payer: BC Managed Care – PPO | Attending: Emergency Medicine | Admitting: Emergency Medicine

## 2011-01-31 ENCOUNTER — Encounter (HOSPITAL_COMMUNITY): Payer: Self-pay | Admitting: *Deleted

## 2011-01-31 ENCOUNTER — Other Ambulatory Visit: Payer: Self-pay

## 2011-01-31 DIAGNOSIS — E876 Hypokalemia: Secondary | ICD-10-CM

## 2011-01-31 DIAGNOSIS — Z8673 Personal history of transient ischemic attack (TIA), and cerebral infarction without residual deficits: Secondary | ICD-10-CM | POA: Insufficient documentation

## 2011-01-31 DIAGNOSIS — I498 Other specified cardiac arrhythmias: Secondary | ICD-10-CM | POA: Insufficient documentation

## 2011-01-31 DIAGNOSIS — R5381 Other malaise: Secondary | ICD-10-CM | POA: Insufficient documentation

## 2011-01-31 DIAGNOSIS — R079 Chest pain, unspecified: Secondary | ICD-10-CM | POA: Insufficient documentation

## 2011-01-31 DIAGNOSIS — R5383 Other fatigue: Secondary | ICD-10-CM | POA: Insufficient documentation

## 2011-01-31 DIAGNOSIS — R404 Transient alteration of awareness: Secondary | ICD-10-CM | POA: Insufficient documentation

## 2011-01-31 DIAGNOSIS — R209 Unspecified disturbances of skin sensation: Secondary | ICD-10-CM | POA: Insufficient documentation

## 2011-01-31 DIAGNOSIS — Q2111 Secundum atrial septal defect: Secondary | ICD-10-CM | POA: Insufficient documentation

## 2011-01-31 DIAGNOSIS — G43909 Migraine, unspecified, not intractable, without status migrainosus: Secondary | ICD-10-CM | POA: Insufficient documentation

## 2011-01-31 DIAGNOSIS — E538 Deficiency of other specified B group vitamins: Secondary | ICD-10-CM | POA: Insufficient documentation

## 2011-01-31 DIAGNOSIS — F29 Unspecified psychosis not due to a substance or known physiological condition: Secondary | ICD-10-CM | POA: Insufficient documentation

## 2011-01-31 DIAGNOSIS — Z7982 Long term (current) use of aspirin: Secondary | ICD-10-CM | POA: Insufficient documentation

## 2011-01-31 DIAGNOSIS — Q211 Atrial septal defect: Secondary | ICD-10-CM | POA: Insufficient documentation

## 2011-01-31 DIAGNOSIS — R0602 Shortness of breath: Secondary | ICD-10-CM | POA: Insufficient documentation

## 2011-01-31 DIAGNOSIS — R55 Syncope and collapse: Secondary | ICD-10-CM | POA: Insufficient documentation

## 2011-01-31 DIAGNOSIS — H538 Other visual disturbances: Secondary | ICD-10-CM | POA: Insufficient documentation

## 2011-01-31 DIAGNOSIS — R000 Tachycardia, unspecified: Secondary | ICD-10-CM

## 2011-01-31 LAB — CBC
MCH: 29.8 pg (ref 26.0–34.0)
MCV: 88.8 fL (ref 78.0–100.0)
Platelets: 221 10*3/uL (ref 150–400)
RDW: 13.1 % (ref 11.5–15.5)

## 2011-01-31 LAB — BASIC METABOLIC PANEL
Calcium: 9.8 mg/dL (ref 8.4–10.5)
Creatinine, Ser: 0.78 mg/dL (ref 0.50–1.10)
GFR calc non Af Amer: >90 mL/min (ref >90)
Glucose, Bld: 139 mg/dL — ABNORMAL HIGH (ref 70–99)
Sodium: 140 mEq/L (ref 135–145)

## 2011-01-31 LAB — DIFFERENTIAL
Basophils Absolute: 0 10*3/uL (ref 0.0–0.1)
Eosinophils Absolute: 0.1 10*3/uL (ref 0.0–0.7)
Eosinophils Relative: 1 % (ref 0–5)

## 2011-01-31 MED ORDER — CLOPIDOGREL BISULFATE 75 MG PO TABS
75.0000 mg | ORAL_TABLET | Freq: Once | ORAL | Status: AC
  Administered 2011-01-31: 75 mg via ORAL
  Filled 2011-01-31: qty 1

## 2011-01-31 MED ORDER — METOPROLOL TARTRATE 50 MG PO TABS
25.0000 mg | ORAL_TABLET | Freq: Once | ORAL | Status: AC
  Administered 2011-01-31: 25 mg via ORAL
  Filled 2011-01-31: qty 1

## 2011-01-31 MED ORDER — METOPROLOL SUCCINATE ER 25 MG PO TB24: 25.0000 mg | ORAL_TABLET | Freq: Every day | ORAL | Status: DC

## 2011-01-31 MED ORDER — SODIUM CHLORIDE 0.9 % IV BOLUS (SEPSIS)
1000.0000 mL | Freq: Once | INTRAVENOUS | Status: AC
  Administered 2011-01-31: 500 mL via INTRAVENOUS

## 2011-01-31 MED ORDER — POTASSIUM CHLORIDE CRYS ER 20 MEQ PO TBCR
20.0000 meq | EXTENDED_RELEASE_TABLET | Freq: Once | ORAL | Status: AC
  Administered 2011-01-31: 20 meq via ORAL
  Filled 2011-01-31: qty 1

## 2011-01-31 NOTE — ED Notes (Signed)
Pt EMS - pt working and became dizzy and weak with increased weakness to left side.  Per EMS - pt's initial HR in 160's.  Pt performed vagal maneuver and hr dropped to 102.  Pt denies pain, c/o increased weakness, numbness/tingling to left side.  Pt has hx of CVA.  Pt alert to self only.  Significant weakness to left grip.

## 2011-01-31 NOTE — ED Provider Notes (Signed)
History     CSN: [id number]  Arrival date & time 01/31/11  1357   First MD Initiated Contact with Patient 01/31/11 1409      Chief Complaint  Patient presents with  . Tachycardia     Patient is a 36 y.o. female presenting with syncope.  Loss of Consciousness This is a new problem. The current episode started less than 1 hour ago. The problem has been gradually improving. Associated symptoms include shortness of breath. The symptoms are aggravated by nothing. The symptoms are relieved by nothing. Treatments tried: vagal maneuvers.  her course is improving Pt presents via EMS for possible syncope event She reports " I couldn't talk and had difficulty moving" EMS arrived and pt was noted to be tachycardic, in SVT and after vagal maneuvers she had improved HR but still was confused and reports numbness/weakness to her left UE She denies cp at this time She did have some SOB but that is improved No fever/abd pain/vomiting/diarrhea She has not taken her meds in 3 days per family (no plavix in 3 days)  Past Medical History  Diagnosis Date  . Ovarian cyst   . Urinary incontinence   . Migraine headache   . Anxiety   . ASD (atrial septal defect), ostium secundum     Status post closure 08/20/10 with Dr. Excell Seltzer; procedure complicated by right groin hematoma and acute blood loss anemia  . History of TIAs   . Vitamin B 12 deficiency   . Stroke     Past Surgical History  Procedure Date  . Thyroid cyst rem   . Tonsillectomy   . Foot neuroma surgery 2009    left  . Thyroid cyst excision   . Asd repair 08/20/2010  . Cardiac surgery     Family History  Problem Relation Age of Onset  . Hypertension Father 72  . Stroke Father   . Heart attack Father   . Brain cancer Father   . Heart disease Father   . Diabetes Father   . Liver disease Mother 67    dead  . Hypertension Brother 35  . Breast cancer Other     PATERNAL AUNT    History  Substance Use Topics  . Smoking status:  Never Smoker   . Smokeless tobacco: Not on file  . Alcohol Use: No    OB History    Grav Para Term Preterm Abortions TAB SAB Ect Mult Living                  Review of Systems  Respiratory: Positive for shortness of breath.   Cardiovascular: Positive for syncope.  All other systems reviewed and are negative.    Allergies  Review of patient's allergies indicates no known allergies.  Home Medications   Current Outpatient Rx  Name Route Sig Dispense Refill  . ASPIRIN 81 MG PO TABS Oral Take 81 mg by mouth daily.      Marland Kitchen CLOPIDOGREL BISULFATE 75 MG PO TABS Oral Take 1 tablet (75 mg total) by mouth daily. 30 tablet 11  . ESCITALOPRAM OXALATE 10 MG PO TABS Oral Take 10 mg by mouth daily.      . MULTIVITAMINS PO TABS Oral Take 1 tablet by mouth daily.      Marland Kitchen VITAMIN B-12 1000 MCG PO TABS Oral Take 1,000 mcg by mouth daily. 2-3 times weekly    . FUROSEMIDE 20 MG PO TABS Oral Take 20 mg by mouth as needed.  BP 132/78  Pulse 101  Temp(Src) 98.8 F (37.1 C) (Oral)  Resp 12  SpO2 100%  LMP 01/30/2011  Physical Exam CONSTITUTIONAL: Well developed/well nourished HEAD AND FACE: Normocephalic/atraumatic EYES: EOMI/PERRL ENMT: Mucous membranes moist NECK: supple no meningeal signs SPINE:entire spine nontender ZO:[id number], no loud murmurs noted LUNGS: Lungs are clear to auscultation bilaterally, no apparent distress ABDOMEN: soft, nontender, no rebound or guarding GU:no cva tenderness NEURO: Pt is awake/alert, moves all extremitiesx4 Awake/alert, facies symmetric, no arm or leg drift is noted Equal hand grips noted to my exam EXTREMITIES: pulses normal, full ROM SKIN: warm, color normal PSYCH: no abnormalities of mood noted  ED Course  Procedures    Labs Reviewed  CBC  DIFFERENTIAL  BASIC METABOLIC PANEL  I-STAT TROPONIN I   Dg Chest Port 1 View  01/31/2011  *RADIOLOGY REPORT*  Clinical Data: Sudden onset of confusion.  Weakness and shortness of breath.   Prior stroke.  PORTABLE CHEST - 1 VIEW  Comparison: 08/24/2010  Findings: Linear subsegmental atelectasis at the left lung base noted.  Lungs are otherwise clear.  Cardiac and mediastinal contours appear unremarkable.  ASD repair noted.  IMPRESSION:  1.  Subsegmental atelectasis in the left lower lobe.  Original Report Authenticated By: Dellia Cloud, M.D.      2:59 PM Pt with h/o ASD s/p perc. Closure Has h/o TIA though extensive workup negative Pt back to baseline, no gross neuro deficits Will call cardiology Pt stable at this time  3:09 PM D/w dr berry, on for [facility] cardiology We discussed her care Start toprol xl 25 daily Will see in office tomorrow in [place] office for echo  4:30 PM Pt awake/alert Co-workers reports she had difficulty speaking, she reported her vision was blurred and she reported decreased hearing No gross motor deficits noted All of those symptoms have improved No gross neuro deficits Will get MRI to r/o CVA as she has not taken meds in past 3 days, small possibility of cardiac emboli  5:28 PM MRI negative Pt ambulatory, her baseline No gross motor/sensory deficits noted No facial droop She feels improved Denies cp/sob at this time No recent travel/surgery, denies h/o DVT/PE and suspicion for PE is low Stable for d/c Discussed strict return precautions The patient appears reasonably screened and/or stabilized for discharge and I doubt any other medical condition or other [facility] requiring further screening, evaluation, or treatment in the ED at this time prior to discharge.  Gave her a dose of plavix here and one dose of metoprolol per cardiology recs   MDM  Nursing notes reviewed and considered in documentation Previous records reviewed and considered xrays reviewed and considered All labs/vitals reviewed and considered      Date: 01/31/2011  Rate: 111   Rhythm: sinus tachycardia  QRS Axis: normal  Intervals: normal  ST/T Wave abnormalities:  nonspecific ST changes  Conduction Disutrbances:none  Narrative Interpretation:   Old EKG Reviewed: unchanged    Joya Gaskins, MD 01/31/11 1730

## 2011-02-05 ENCOUNTER — Ambulatory Visit (INDEPENDENT_AMBULATORY_CARE_PROVIDER_SITE_OTHER): Payer: BC Managed Care – PPO | Admitting: Family Medicine

## 2011-02-05 ENCOUNTER — Encounter: Payer: Self-pay | Admitting: Family Medicine

## 2011-02-05 VITALS — BP 98/72 | HR 69 | Resp 16 | Ht 65.0 in | Wt 143.0 lb

## 2011-02-05 DIAGNOSIS — R5381 Other malaise: Secondary | ICD-10-CM

## 2011-02-05 DIAGNOSIS — R002 Palpitations: Secondary | ICD-10-CM

## 2011-02-05 DIAGNOSIS — Q2111 Secundum atrial septal defect: Secondary | ICD-10-CM

## 2011-02-05 DIAGNOSIS — I959 Hypotension, unspecified: Secondary | ICD-10-CM

## 2011-02-05 DIAGNOSIS — R5383 Other fatigue: Secondary | ICD-10-CM

## 2011-02-05 DIAGNOSIS — G459 Transient cerebral ischemic attack, unspecified: Secondary | ICD-10-CM

## 2011-02-05 DIAGNOSIS — E876 Hypokalemia: Secondary | ICD-10-CM

## 2011-02-05 DIAGNOSIS — Q211 Atrial septal defect: Secondary | ICD-10-CM

## 2011-02-05 MED ORDER — IBUPROFEN 800 MG PO TABS: 800.0000 mg | ORAL_TABLET | Freq: Three times a day (TID) | ORAL | Status: AC | PRN

## 2011-02-05 NOTE — Progress Notes (Signed)
  Subjective:    Patient ID: Candice Estrada, female    DOB: 1975/07/08, 36 y.o.   MRN: [id number]  HPI Pt here to f/u ED visit, presented to ED last Thursday after an episode of weakness and confusion similar to previous TIA's, pt found to be in SVT via EMS, started on Toprol, cardiology consulted but arranged outpatient follow-up which has been rescheduled to this week. She continues to feel bad, feels very fatigue and like something is off, has chest discomoft and palpitations at times, her blood pressure continues to run low, in her office today systolic was 85. She feels weak like she may pass out and drained of energy. She is very concerned with her ASD and thinks this needs to be re-evaluated since the surgery.   Review of Systems  GEN- +fatigue, denies fever, weight loss,weakness, recent illness HEENT- denies eye drainage, change in vision, nasal discharge, CVS- denies chest pain, +palpitations RESP- denies SOB, cough, wheeze ABD- denies N/V, change in stools, abd pain Neuro- denies headache,+ dizziness, + pre-syncope, seizure activity       Objective:   Physical Exam GEN- NAD, alert and oriented x3 HEENT- PERRL, EOMI, non injected sclera, pink conjunctiva, MMM, oropharynx clear Neck- Supple, no thryomegaly CVS- bradycardia no murmur RESP-CTAB EXT- No edema Pulses- Radial, DP- 2+ Neuro- No focal deficits  EKG- NRS       Assessment & Plan:

## 2011-02-05 NOTE — Patient Instructions (Addendum)
Keep the follow-up with Dr. Rennis Golden on Thursday Take 1/2 of the metoprolol until you see you cardiologist If you have palpitations or chest pain please go back to the ER Your hemoglobin was 13.3 Eat foods with potassium Please get your BMET drawn to recheck your potassium level and thyroid level F/U in 2 weeks after seen by cardiology

## 2011-02-06 ENCOUNTER — Encounter: Payer: Self-pay | Admitting: Family Medicine

## 2011-02-06 DIAGNOSIS — R5383 Other fatigue: Secondary | ICD-10-CM | POA: Insufficient documentation

## 2011-02-06 NOTE — Assessment & Plan Note (Signed)
Likely multifactorial with recent events, lower BP and new start on Toprol. TSH and potassium to be rechecked Decrease toprol per above

## 2011-02-06 NOTE — Assessment & Plan Note (Signed)
She continues to have recurrent episodes of weakness, some witnessed. MRI of brain negative on Thursday, no new focal deficits, will have her consult with her neurologist via phone. I do not think there is anything they can do at this time

## 2011-02-06 NOTE — Assessment & Plan Note (Signed)
I think the general fatigue and not feeling well which has come after her event may be related to the Toprol, as she has very soft BP at baseline with history of POTS, i will decrease this to 12.5mg  until she follow up with cards in 2 days. I prefer not to stop this as she continues to have chest pain and palpitations and was in SVT

## 2011-02-06 NOTE — Assessment & Plan Note (Signed)
Will defer to cards for any follow-up echo or ultrasound

## 2011-02-06 NOTE — Assessment & Plan Note (Signed)
Normal EKG in office, copy given to patient

## 2011-02-12 ENCOUNTER — Encounter: Payer: Self-pay | Admitting: Family Medicine

## 2011-02-18 ENCOUNTER — Ambulatory Visit: Payer: BC Managed Care – PPO | Admitting: Family Medicine

## 2011-03-06 ENCOUNTER — Telehealth: Payer: Self-pay | Admitting: Family Medicine

## 2011-03-06 NOTE — Telephone Encounter (Signed)
Please tell her to stop the toprol, she was on 12.5mg . Take her blood pressure daily after stopping. She can be liberal with her salt intake for now. Schedule f/u visit with me since she can not get into cards until April.

## 2011-03-07 NOTE — Telephone Encounter (Signed)
Called pt no answer °

## 2011-03-07 NOTE — Telephone Encounter (Signed)
Pt aware.

## 2011-03-14 ENCOUNTER — Ambulatory Visit (INDEPENDENT_AMBULATORY_CARE_PROVIDER_SITE_OTHER): Payer: BC Managed Care – PPO | Admitting: Family Medicine

## 2011-03-14 ENCOUNTER — Encounter: Payer: Self-pay | Admitting: Family Medicine

## 2011-03-14 DIAGNOSIS — I959 Hypotension, unspecified: Secondary | ICD-10-CM

## 2011-03-14 DIAGNOSIS — F411 Generalized anxiety disorder: Secondary | ICD-10-CM

## 2011-03-14 DIAGNOSIS — R569 Unspecified convulsions: Secondary | ICD-10-CM

## 2011-03-14 DIAGNOSIS — R42 Dizziness and giddiness: Secondary | ICD-10-CM

## 2011-03-14 MED ORDER — FLUDROCORTISONE ACETATE 0.1 MG PO TABS: 0.1000 mg | ORAL_TABLET | Freq: Every day | ORAL | Status: DC

## 2011-03-14 NOTE — Progress Notes (Signed)
  Subjective:    Patient ID: Candice Estrada, female    DOB: Dec 13, 1975, 36 y.o.   MRN: [id number]  HPI Patient presents to followup her hypertension. She continues to have episodes of very low blood pressure with systolic into the 70s and diastolic in the 40s. During these times she feels very fatigued and dizzy and feels like she is going to pass out. She also has had repetitive symptoms similar to her TIAs- where she stares off, vision changes, has numbness on face and mental thoughts get garbeled. She's concerned about seizure. She was scheduled to have an EEG in [place] but was unable to make this and is requesting the EEG to be done locally. She is to establish with a new cardiologist in [place]. She does have a history of ASD and they will be looking at her closure to see if this is contributing to her symptoms of dizziness hypotension and other neurological symptoms.She was on Midodrine but this did not help his symptoms. She was placed on Lexapro per her cardiologist but has tapered herself off, her anxiety has increased but she does not want to be on medications. She has also stopped her Beta blocker   Review of Systems   GEN- +fatigue, denies fever, weight loss,weakness, recent illness HEENT- denies eye drainage, change in vision, nasal discharge, CVS- denies chest pain, palpitations RESP- denies SOB, cough, wheeze ABD- denies N/V, change in stools, abd pain Neuro- denies headache,+ dizziness, + pre-syncope, seizure activity     Objective:   Physical Exam  Orthostatics- Lying 98/60, sitting 104/62, standing 118/60 GEN- NAD, alert and oriented x3 HEENT- PERRL, EOMI, non injected sclera,  CVS- RRR no murmur RESP-CTAB EXT- No edema Pulses- Radial, DP- 2+ Neuro- No focal deficits, CNII-XII grossly in tact        Assessment & Plan:

## 2011-03-14 NOTE — Assessment & Plan Note (Signed)
Concern her symptoms may be seizure related with staring spells and repeat TIA, will set up EEG here in town

## 2011-03-14 NOTE — Assessment & Plan Note (Signed)
She does suffer with some anxiety, however she has had a very difficult year with multiple hospitalizations and symptoms that have no specific diagnosis of now. She will hold on treatment for anxiety, she feels she is coping well enough. If this deteriorates will discuss reinstating another [facility]

## 2011-03-14 NOTE — Patient Instructions (Signed)
I will refer you to neurology for an EEG Start the florinef for your blood pressure  Get the labs done F/U in 1 week

## 2011-03-14 NOTE — Assessment & Plan Note (Signed)
Referral to local neurologist for EEG

## 2011-03-14 NOTE — Assessment & Plan Note (Signed)
I will give her a trial of florinef, will have BMET and TSH drawn as she did not have this done at last visit. Cardiology appt in [place] set for April

## 2011-03-15 LAB — BASIC METABOLIC PANEL
CO2: 20 mEq/L (ref 19–32)
Glucose, Bld: 68 mg/dL — ABNORMAL LOW (ref 70–99)
Potassium: 3.9 mEq/L (ref 3.5–5.3)
Sodium: 142 mEq/L (ref 135–145)

## 2011-03-15 NOTE — Progress Notes (Signed)
Noted  

## 2011-03-21 ENCOUNTER — Ambulatory Visit: Payer: BC Managed Care – PPO | Admitting: Family Medicine

## 2011-03-28 ENCOUNTER — Ambulatory Visit: Payer: BC Managed Care – PPO | Admitting: Family Medicine

## 2011-04-05 ENCOUNTER — Telehealth: Payer: Self-pay | Admitting: Family Medicine

## 2011-04-05 MED ORDER — PREDNISONE 20 MG PO TABS: 20.0000 mg | ORAL_TABLET | Freq: Every day | ORAL | Status: AC

## 2011-04-05 NOTE — Telephone Encounter (Signed)
Medication sent for poison oak, also advise Calamine lotion or oatmeal bath, wash all clothes or dispose of them

## 2011-04-05 NOTE — Telephone Encounter (Signed)
Pt aware.

## 2011-04-16 ENCOUNTER — Encounter: Payer: Self-pay | Admitting: Family Medicine

## 2011-04-26 ENCOUNTER — Encounter: Payer: Self-pay | Admitting: Cardiovascular Disease

## 2011-04-26 ENCOUNTER — Ambulatory Visit (INDEPENDENT_AMBULATORY_CARE_PROVIDER_SITE_OTHER): Payer: BC Managed Care – PPO | Admitting: Cardiovascular Disease

## 2011-04-26 VITALS — BP 80/60 | HR 76 | Ht 63.0 in | Wt 142.0 lb

## 2011-04-26 DIAGNOSIS — R55 Syncope and collapse: Secondary | ICD-10-CM | POA: Insufficient documentation

## 2011-04-26 DIAGNOSIS — Q211 Atrial septal defect: Secondary | ICD-10-CM

## 2011-04-26 MED ORDER — FLUDROCORTISONE ACETATE 0.1 MG PO TABS: 0.1000 mg | ORAL_TABLET | Freq: Two times a day (BID) | ORAL | Status: DC

## 2011-04-26 NOTE — Assessment & Plan Note (Addendum)
Followup echo studies at the [facility] office showed appropriate device position with no residual shunting. We may need to consider a repeat study if she continues to be symptomatic, but I suspect it will be normal. In going to see her back in close followup in 6-8 weeks.

## 2011-04-26 NOTE — Patient Instructions (Addendum)
Your physician has recommended you make the following change in your medication: START Florinef 0.1mg  take one by mouth twice a day  Your physician recommends that you return for lab work in 2 WEEKS: BMP (order given to pt)  Your physician recommends that you schedule a follow-up appointment in: 6-8 WEEKS with Dr Excell Seltzer

## 2011-04-26 NOTE — Progress Notes (Signed)
   HPI:  This is a 36 year old woman presenting for follow up evaluation. The patient is known to me from undergoing transcatheter ASD closure last year. 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 She underwent transcatheter closure complicated by groin hematoma. She really hasn't ever felt well since her closure procedure was done. She complains of lack of energy and tiredness. She has developed episodes of near-syncope and syncope. She has a prodrome of dizziness and weakness followed by tachycardia, diaphoresis, nausea, and syncope. There has been no clear precipitant. Episodes have occurred at work while sitting in a chair working on the computer. Her most recent episode last week occurred after a bath. She has no chest pain. She has had followup echo studies showing appropriate device position and no residual shunting. She has had followup neurologic exam. There have been no neurologic diagnoses made. She was tried on Midodrine but this did not provide any symptomatic improvement. She was then tried on Florinef by her primary physician at a dose of 0.1 mg daily, but this did not help after 30 days. She reports very good fluid intake and she add salt to her food.  Outpatient Encounter Prescriptions as of 04/26/2011  Medication Sig Dispense Refill  . Multiple Vitamin (MULTIVITAMIN) tablet Take 1 tablet by mouth daily.      . fludrocortisone (FLORINEF) 0.1 MG tablet Take 1 tablet (0.1 mg total) by mouth 2 (two) times daily.  180 tablet  3  . DISCONTD: fludrocortisone (FLORINEF) 0.1 MG tablet Take 1 tablet (0.1 mg total) by mouth daily.  30 tablet  0    No Known Allergies  Past Medical History  Diagnosis Date  . Ovarian cyst   . Urinary incontinence   . Migraine headache   . Anxiety   . ASD (atrial septal defect), ostium secundum     Status post closure 08/20/10 with Dr. Excell Seltzer; procedure complicated by right groin hematoma and acute blood  loss anemia  . History of TIAs   . Vitamin B 12 deficiency   . Stroke   . H. pylori infection 2012    ROS: Negative except as per HPI  BP 80/60  Pulse 76  Ht 5\' 3"  (1.6 m)  Wt 64.411 kg (142 lb)  BMI 25.15 kg/m2  PHYSICAL EXAM: Pt is alert and oriented, NAD Supine blood pressure was 115/70 in the right arm. Standing blood pressure was 110/75. There was no significant increase in her pulse rate. HEENT: normal Neck: JVP - normal, carotids 2+= without bruits Lungs: CTA bilaterally CV: RRR without murmur or gallop Abd: soft, NT, Positive BS, no hepatomegaly Ext: no C/C/E, distal pulses intact and equal Skin: warm/dry no rash  EKG:  Normal sinus rhythm 76 beats per minute, within normal limits.  ASSESSMENT AND PLAN:

## 2011-04-26 NOTE — Assessment & Plan Note (Signed)
Symptoms are fairly typical of neurocompression or syncope. It sounds like she is already pushing fluids and has plenty of salt intake. Will try her on a higher dose of Florinef at 0.1 mg twice daily. She should have a repeat metabolic panel in 2 weeks. I'll see her back in 6-8 weeks for followup. If she is not better on going to refer to Dr. Graciela Husbands for further evaluation. Will also repeat an echo if her symptoms don't improve.

## 2011-05-01 ENCOUNTER — Telehealth: Payer: Self-pay | Admitting: Cardiovascular Disease

## 2011-05-01 DIAGNOSIS — R55 Syncope and collapse: Secondary | ICD-10-CM

## 2011-05-01 DIAGNOSIS — Q211 Atrial septal defect: Secondary | ICD-10-CM

## 2011-05-01 NOTE — Telephone Encounter (Signed)
New msg Pt said since taking new med-fludrocortisone her ankles and legs have been swelling a lot. Please call

## 2011-05-01 NOTE — Telephone Encounter (Signed)
Per Dr Excell Seltzer the pt should decrease Florinef to 0.1mg  daily and be referred to Dr Graciela Husbands for evaluation of syncope (the pt has gained 4 lbs since starting florinef).  I spoke with the pt and made her aware of Dr Earmon Phoenix recommendation.

## 2011-05-13 ENCOUNTER — Encounter: Payer: Self-pay | Admitting: Internal Medicine

## 2011-05-13 ENCOUNTER — Ambulatory Visit (INDEPENDENT_AMBULATORY_CARE_PROVIDER_SITE_OTHER): Payer: BC Managed Care – PPO | Admitting: Internal Medicine

## 2011-05-13 VITALS — BP 101/78 | HR 90 | Ht 64.0 in | Wt 143.8 lb

## 2011-05-13 DIAGNOSIS — R55 Syncope and collapse: Secondary | ICD-10-CM

## 2011-05-13 DIAGNOSIS — G459 Transient cerebral ischemic attack, unspecified: Secondary | ICD-10-CM

## 2011-05-13 DIAGNOSIS — R000 Tachycardia, unspecified: Secondary | ICD-10-CM

## 2011-05-13 DIAGNOSIS — I951 Orthostatic hypotension: Secondary | ICD-10-CM

## 2011-05-13 DIAGNOSIS — I498 Other specified cardiac arrhythmias: Secondary | ICD-10-CM

## 2011-05-13 NOTE — Assessment & Plan Note (Signed)
She's had recurrent episodes of presyncope and syncope that are preceded by dizziness program that lasts 2 minutes or so. 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 Interestingly, they do not occur while she is standing; she notes that she never stops moving however. The recovery fatigue is characteristic.  Aggravating factors since her surgery could include arrhythmias as suggested by her 160 beats per minute, although she is having before her surgery coincidental with her having recently married.  She is on Florinef and has been for face. She has tolerated reasonably with some edema but not terribly and has had no intercurrent episodes.  She is further advised that if she has onset of prodrome it should become recumbent.

## 2011-05-13 NOTE — Assessment & Plan Note (Signed)
There is evidence of some orthostatic like intolerance today but not diagnostic of POTS

## 2011-05-13 NOTE — Patient Instructions (Signed)
Your physician has recommended that you wear an event monitor. Event monitors are medical devices that record the heart's electrical activity. Doctors most often us these monitors to diagnose arrhythmias. Arrhythmias are problems with the speed or rhythm of the heartbeat. The monitor is a small, portable device. You can wear one while you do your normal daily activities. This is usually used to diagnose what is causing palpitations/syncope (passing out).  Your physician recommends that you schedule a follow-up appointment in: 5-6 weeks with Dr. Klein.  

## 2011-05-13 NOTE — Assessment & Plan Note (Signed)
She has had recurrence of left-sided numbness and progression of left-sided weakness. I have encouraged her to follow up with Dr. Pearlean Brownie w neurology.

## 2011-05-13 NOTE — Progress Notes (Signed)
History and Physical  Patient ID: Candice Estrada MRN: [id number], SOB: 09-Jan-1976 35 y.o. Date of Encounter: 05/13/2011, 10:43 AM  Primary Physician: Milinda Antis, MD, MD Primary Cardiologist: [facility] Primary Electrophysiologist:  SK  Chief Complaint: syncope  History of Present Illness: Candice Estrada is a 36 y.o. female  seen at the request of Dr. Excell Seltzer because of recurrent episodes of syncope.  She is status post ASD repair in 1August 2012 There has been a significant escalation frequency of the episodes that she has had 10 episodes since then and 5 episodes over the last few months. These are quite stereotypical with a 2-3 minute prodrome characterized by vertiginous sensation, tinnitus or found dizziness. There is occasional warmth and clamminess. The recovery phase is notable for profound fatigue.  She does not have significant baseline orthostatic intolerance. She did have some shower intolerance following her procedure.  She has a baseline a relatively low blood pressure-in the 80s or so. She is treated with ProAmatine in the past was not significantly beneficial.  She is currently on Florinef  For the spelled most recently, she was tried on meclizine without improvement. He has had an EEG which was negative.  She also has a history of a TIA prior to her ASD closure. Subsequent to her closure, had an episode of left-sided numbness which has been associated with protracted left-sided modest worsening of weakness.  She also had an episode in January where her heart rate was detected 160. These strips are not available in the computer all we see there is sinus rhythm in the low 100s.   Past Medical History  Diagnosis Date  . Ovarian cyst   . Urinary incontinence   . Migraine headache   . Anxiety   . ASD (atrial septal defect), ostium secundum     Status post closure 08/20/10 with Dr. Excell Seltzer; procedure complicated by right groin hematoma and acute blood loss anemia  . History  of TIAs   . Vitamin B 12 deficiency   . Stroke   . H. pylori infection 2012     Past Surgical History  Procedure Date  . Thyroid cyst rem   . Tonsillectomy   . Foot neuroma surgery 2009    left  . Thyroid cyst excision   . Asd repair 08/20/2010  . Cardiac surgery       Current Outpatient Prescriptions  Medication Sig Dispense Refill  . fludrocortisone (FLORINEF) 0.1 MG tablet Take 1 tablet (0.1 mg total) by mouth daily.  1 tablet  0  . Multiple Vitamin (MULTIVITAMIN) tablet Take 1 tablet by mouth daily.         Allergies: No Known Allergies   History  Substance Use Topics  . Smoking status: Never Smoker   . Smokeless tobacco: Never Used  . Alcohol Use: Yes     very rarely      Family History  Problem Relation Age of Onset  . Hypertension Father 64  . Stroke Father   . Heart attack Father   . Brain cancer Father   . Heart disease Father   . Diabetes Father   . Liver disease Mother 29    dead  . Hypertension Brother 35  . Breast cancer Other     PATERNAL AUNT      ROS:  Please see the history of present illness.    All other systems reviewed and negative.   Vital Signs: Blood pressure 101/78, pulse 90, height 5\' 4"  (1.626 m), weight 143 lb  12.8 oz (65.227 kg).  PHYSICAL EXAM: General:  Well nourished, well developed female in no acute distress HEENT: normal Lymph: no adenopathy Neck: no JVD Endocrine:  No thryomegaly Vascular: No carotid bruits; FA pulses 2+ bilaterally without bruits Cardiac:  normal S1, S2; RRR; no murmur Back: without kyphosis/scoliosis, no CVA tenderness Lungs:  clear to auscultation bilaterally, no wheezing, rhonchi or rales Abd: soft, nontender, no hepatomegaly Ext: no edema Musculoskeletal:  No deformities, BUE and BLE strength normal and equal Skin: warm and dry Neuro:  CNs 2-12 intact, no focal abnormalities noted Psych:  Normal affect   EKG:  Sinus rhyhtm  Labs:  Lab Results  Component Value Date   CHOL  Value:  166        ATP III CLASSIFICATION:  <200     mg/dL   Desirable  161-096  mg/dL   Borderline High  >=045    mg/dL   High        04/22/8117   HDL 50 03/13/2010   LDLCALC  Value: 98        Total Cholesterol/HDL:CHD Risk Coronary Heart Disease Risk Table                     Men   Women  1/2 Average Risk   3.4   3.3  Average Risk       5.0   4.4  2 X Average Risk   9.6   7.1  3 X Average Risk  23.4   11.0        Use the calculated Patient Ratio above and the CHD Risk Table to determine the patient's CHD Risk.        ATP III CLASSIFICATION (LDL):  <100     mg/dL   Optimal  147-829  mg/dL   Near or Above                    Optimal  130-159  mg/dL   Borderline  562-130  mg/dL   High  >865     mg/dL   Very High 7/84/6962   TRIG 92 03/13/2010      ASSESSMENT AND PLAN:

## 2011-05-14 ENCOUNTER — Ambulatory Visit: Payer: BC Managed Care – PPO

## 2011-05-20 ENCOUNTER — Ambulatory Visit: Payer: BC Managed Care – PPO | Admitting: Orthopedic Surgery

## 2011-05-23 ENCOUNTER — Telehealth: Payer: Self-pay | Admitting: Cardiovascular Disease

## 2011-05-23 MED ORDER — AMOXICILLIN 500 MG PO CAPS: ORAL_CAPSULE | ORAL | Status: DC

## 2011-05-23 NOTE — Telephone Encounter (Signed)
Please return call to patient regarding upcoming dentist appnt, she can be reached at [phone].

## 2011-05-23 NOTE — Telephone Encounter (Signed)
Pt called back needed to know if abx needed for dental cleaning today, asked debra gray in triage, lauren out today, she will check chart and advise pt

## 2011-05-23 NOTE — Telephone Encounter (Signed)
Pt is having dentl procedure today. Amoxicillin 2 gm 1 hour prior to procedure called in for pt Mylo Red RN

## 2011-05-27 ENCOUNTER — Other Ambulatory Visit: Payer: Self-pay | Admitting: Cardiovascular Disease

## 2011-05-27 ENCOUNTER — Telehealth: Payer: Self-pay | Admitting: Family Medicine

## 2011-05-27 NOTE — Telephone Encounter (Signed)
Please advise 

## 2011-05-27 NOTE — Telephone Encounter (Signed)
Please let pt know this has to be treated by dentist, I do not call in antibiotics without recent evaluation and insurance will not cover dental problems in a primary care setting.

## 2011-05-28 NOTE — Telephone Encounter (Signed)
The pt does not require SBE any longer---she is past 6 months for ASD closure/lwb

## 2011-05-28 NOTE — Telephone Encounter (Signed)
Patient aware.

## 2011-06-03 ENCOUNTER — Other Ambulatory Visit: Payer: Self-pay | Admitting: Cardiovascular Disease

## 2011-06-14 ENCOUNTER — Ambulatory Visit: Payer: BC Managed Care – PPO | Admitting: Cardiovascular Disease

## 2011-06-25 ENCOUNTER — Encounter: Payer: Self-pay | Admitting: Family Medicine

## 2011-06-25 ENCOUNTER — Ambulatory Visit (INDEPENDENT_AMBULATORY_CARE_PROVIDER_SITE_OTHER): Payer: BC Managed Care – PPO | Admitting: Family Medicine

## 2011-06-25 VITALS — BP 90/68 | HR 77 | Resp 16 | Ht 65.0 in | Wt 143.0 lb

## 2011-06-25 DIAGNOSIS — R002 Palpitations: Secondary | ICD-10-CM

## 2011-06-25 DIAGNOSIS — N760 Acute vaginitis: Secondary | ICD-10-CM

## 2011-06-25 DIAGNOSIS — I959 Hypotension, unspecified: Secondary | ICD-10-CM

## 2011-06-25 NOTE — Assessment & Plan Note (Signed)
Recurrent episodes, she is to call cardiology about her monitor and follow-up

## 2011-06-25 NOTE — Progress Notes (Signed)
  Subjective:    Patient ID: Candice Estrada, female    DOB: Mar 16, 1975, 36 y.o.   MRN: [id number]  HPI Pt presents for f/u on palpitations and dizzy episidoes, she had another one of typical episodes however event monitor is not activated and did not catch it.  She is still on florinef. Her blood pressure continues to run low, has f/u with cardiology next week She admits to vaginal discharge for the past 3-4 days. She has a history of recurrent yeast infections and uses Diflucan on a regular basis. Her Pap smears have been normal. She is married in a monogamous relationship. She tried her Diflucan but feels that it is not clearing up and is worried about bacterial vaginosis which she has had in the past she's not able to take Flagyl by mouth secondary to nausea vomiting   Review of Systems - per above GEN- denies fatigue, fever, weight loss,weakness, recent illness HEENT- denies eye drainage, change in vision, nasal discharge, CVS- denies chest pain, +palpitations RESP- denies SOB, cough, wheeze ABD- denies N/V, change in stools, abd pain GU- denies dysuria, hematuria, dribbling, incontinence MSK- denies joint pain, muscle aches, injury Neuro- denies headache, +dizziness,+ syncope, seizure activity       Objective:   Physical Exam GEN- NAD, alert and oriented, Neck- supple, n CVS-RRR, no murmur RESP-CTAB ABD-NABS,soft, NT,ND  GU- normal external genitalia,mild irritation to labia majora bilat, vaginal mucosa pink and moist, cervix visualized no growth, no blood form os, +discharge, + odor, no CMT, no ovarian masses, uterus normal size        Assessment & Plan:

## 2011-06-25 NOTE — Patient Instructions (Signed)
Continue current medications We will call with results in the morning. Call cardiology about your monitor F/U 6 months

## 2011-06-25 NOTE — Assessment & Plan Note (Signed)
She is maintained on florinef by cardiology, they do not think this is true POTS disease.

## 2011-06-25 NOTE — Assessment & Plan Note (Signed)
Will obtain cultures and start before starting any treatment. Discussed the use of douching, bubblebath, scented pantiliners.

## 2011-06-26 ENCOUNTER — Telehealth: Payer: Self-pay | Admitting: Family Medicine

## 2011-06-26 LAB — WET PREP BY MOLECULAR PROBE: Gardnerella vaginalis: POSITIVE — AB

## 2011-06-26 MED ORDER — METRONIDAZOLE 0.75 % VA GEL: 1.0000 | Freq: Two times a day (BID) | VAGINAL | Status: DC

## 2011-06-26 NOTE — Telephone Encounter (Signed)
Dr Jeanice Lim left message on cell with patients approval

## 2011-06-26 NOTE — Telephone Encounter (Signed)
Pharmacy states directions for metronidazole was written for twice daily and its usually prescribed once daily at bedtime. Is this how you meant to write it?

## 2011-06-26 NOTE — Addendum Note (Signed)
Addended by: Milinda Antis F on: 06/26/2011 11:53 AM   Modules accepted: Orders

## 2011-06-27 ENCOUNTER — Telehealth: Payer: Self-pay | Admitting: Family Medicine

## 2011-06-27 MED ORDER — METRONIDAZOLE 0.75 % VA GEL: 1.0000 | Freq: Two times a day (BID) | VAGINAL | Status: AC

## 2011-06-27 MED ORDER — METRONIDAZOLE 0.75 % VA GEL: 1.0000 | Freq: Every day | VAGINAL | Status: AC

## 2011-06-27 NOTE — Telephone Encounter (Signed)
Sorry, I was giving prescription for oral dosing, change to once daily this is for bacterial vaginosis Thanks

## 2011-06-27 NOTE — Telephone Encounter (Signed)
Was sent in yesterday but they were questioning the directions. Sent back this morning with directions to dispense per directions

## 2011-07-01 ENCOUNTER — Ambulatory Visit: Payer: BC Managed Care – PPO | Admitting: Internal Medicine

## 2011-08-30 ENCOUNTER — Other Ambulatory Visit: Payer: Self-pay | Admitting: Family Medicine

## 2011-08-30 DIAGNOSIS — Z1231 Encounter for screening mammogram for malignant neoplasm of breast: Secondary | ICD-10-CM

## 2011-09-20 ENCOUNTER — Ambulatory Visit: Payer: BC Managed Care – PPO

## 2011-09-27 ENCOUNTER — Ambulatory Visit
Admission: RE | Admit: 2011-09-27 | Discharge: 2011-09-27 | Disposition: A | Discharge status: Home / Self Care | Payer: BC Managed Care – PPO | Source: Ambulatory Visit | Attending: Family Medicine | Admitting: Family Medicine

## 2011-09-27 DIAGNOSIS — Z1231 Encounter for screening mammogram for malignant neoplasm of breast: Secondary | ICD-10-CM

## 2011-10-21 ENCOUNTER — Ambulatory Visit (INDEPENDENT_AMBULATORY_CARE_PROVIDER_SITE_OTHER): Payer: BC Managed Care – PPO | Admitting: Family Medicine

## 2011-10-21 ENCOUNTER — Other Ambulatory Visit (HOSPITAL_COMMUNITY)
Admission: RE | Admit: 2011-10-21 | Discharge: 2011-10-21 | Disposition: A | Discharge status: Home / Self Care | Payer: BC Managed Care – PPO | Source: Ambulatory Visit | Attending: Family Medicine | Admitting: Family Medicine

## 2011-10-21 ENCOUNTER — Encounter: Payer: Self-pay | Admitting: Family Medicine

## 2011-10-21 VITALS — BP 122/70 | HR 104 | Resp 18 | Ht 65.0 in | Wt 138.1 lb

## 2011-10-21 DIAGNOSIS — N760 Acute vaginitis: Secondary | ICD-10-CM | POA: Insufficient documentation

## 2011-10-21 DIAGNOSIS — R55 Syncope and collapse: Secondary | ICD-10-CM

## 2011-10-21 DIAGNOSIS — Z8673 Personal history of transient ischemic attack (TIA), and cerebral infarction without residual deficits: Secondary | ICD-10-CM

## 2011-10-21 DIAGNOSIS — L738 Other specified follicular disorders: Secondary | ICD-10-CM

## 2011-10-21 DIAGNOSIS — L678 Other hair color and hair shaft abnormalities: Secondary | ICD-10-CM

## 2011-10-21 DIAGNOSIS — Z202 Contact with and (suspected) exposure to infections with a predominantly sexual mode of transmission: Secondary | ICD-10-CM

## 2011-10-21 DIAGNOSIS — R42 Dizziness and giddiness: Secondary | ICD-10-CM

## 2011-10-21 DIAGNOSIS — L731 Pseudofolliculitis barbae: Secondary | ICD-10-CM

## 2011-10-21 DIAGNOSIS — Z9189 Other specified personal risk factors, not elsewhere classified: Secondary | ICD-10-CM

## 2011-10-21 DIAGNOSIS — Z113 Encounter for screening for infections with a predominantly sexual mode of transmission: Secondary | ICD-10-CM | POA: Insufficient documentation

## 2011-10-21 LAB — CBC
Platelets: 296 10*3/uL (ref 150–400)
RDW: 14.4 % (ref 11.5–15.5)
WBC: 5.7 10*3/uL (ref 4.0–10.5)

## 2011-10-21 LAB — RPR

## 2011-10-21 LAB — LIPID PANEL
Cholesterol: 191 mg/dL (ref 0–200)
HDL: 62 mg/dL (ref >39)
Triglycerides: 88 mg/dL (ref <150)

## 2011-10-21 LAB — COMPREHENSIVE METABOLIC PANEL
ALT: 14 U/L (ref 0–35)
Albumin: 4.8 g/dL (ref 3.5–5.2)
CO2: 26 mEq/L (ref 19–32)
Calcium: 10 mg/dL (ref 8.4–10.5)
Chloride: 104 mEq/L (ref 96–112)
Sodium: 140 mEq/L (ref 135–145)
Total Protein: 7.8 g/dL (ref 6.0–8.3)

## 2011-10-21 MED ORDER — FLUDROCORTISONE ACETATE 0.1 MG PO TABS: 0.1000 mg | ORAL_TABLET | Freq: Every day | ORAL | Status: DC

## 2011-10-21 MED ORDER — CEPHALEXIN 500 MG PO CAPS: 500.0000 mg | ORAL_CAPSULE | Freq: Two times a day (BID) | ORAL | Status: DC

## 2011-10-21 NOTE — Patient Instructions (Addendum)
We will call with lab results Use warm compresses  Take antibiotic as prescribed, if this does not improve Continue florinef F/U 6 months

## 2011-10-21 NOTE — Progress Notes (Signed)
  Subjective:    Patient ID: Candice Estrada, female    DOB: [birth year], 36 y.o.   MRN: [id number]  HPI Boil on inner labia x 2 days, sexuallty active with new partner, her husband left with another women 7 months ago , she would like complete STD check, has some thick white discharge, no odor, no itching  Dizzy spell while out of town, she had to pull over, was evaluated at urgent care, but declined ER admission, she felt it was another TIA   Review of Systems  GEN- denies fatigue, fever, weight loss,weakness, recent illness HEENT- denies eye drainage, change in vision, nasal discharge, CVS- denies chest pain, palpitations RESP- denies SOB, cough, wheeze ABD- denies N/V, change in stools, abd pain GU- denies dysuria, hematuria, dribbling, incontinence MSK- denies joint pain, muscle aches, injury Neuro- denies headache, dizziness, syncope, seizure activity      Objective:   Physical Exam GEN- NAD, alert and oriented x3 HEENT- PERRL, EOMI, non injected sclera, pink conjunctiva, MMM, oropharynx clear Neck- Supple,  CVS- RRR, no murmur RESP-CTAB ABD-NABS,soft,NT,ND  EXT- No edema Pulses- Radial 2+ GU- normal external genitalia, vaginal mucosa pink and moist, cervix visualized no growth, mild spotting form os, +l thin clear discharge, no odor, no CMT, no ovarian masses, uterus normal size, mild TTP upon palpation over left ovarian region, no rebound, no mass felt , small ingrown hair on left majora Neuro- CNII-XII in tact, no focal deficits        Assessment & Plan:

## 2011-10-21 NOTE — Assessment & Plan Note (Signed)
Recurrent dizzy spells/ f/u neurology, baseline labs done

## 2011-10-21 NOTE — Assessment & Plan Note (Addendum)
Cultures obtained including GC/Chlaymdia at pt request, STD check done  BV positive, will treat with flagyl

## 2011-10-22 ENCOUNTER — Telehealth: Payer: Self-pay

## 2011-10-22 ENCOUNTER — Encounter: Payer: Self-pay | Admitting: Family Medicine

## 2011-10-22 LAB — HSV 1 ANTIBODY, IGG: HSV 1 Glycoprotein G Ab, IgG: 8.18 IV — ABNORMAL HIGH

## 2011-10-22 MED ORDER — FLUCONAZOLE 150 MG PO TABS: 150.0000 mg | ORAL_TABLET | Freq: Once | ORAL | Status: AC

## 2011-10-22 MED ORDER — METRONIDAZOLE 500 MG PO TABS: 500.0000 mg | ORAL_TABLET | Freq: Two times a day (BID) | ORAL | Status: DC

## 2011-10-22 NOTE — Telephone Encounter (Signed)
error 

## 2011-10-23 DIAGNOSIS — L731 Pseudofolliculitis barbae: Secondary | ICD-10-CM | POA: Insufficient documentation

## 2011-10-23 NOTE — Assessment & Plan Note (Signed)
Very small lesion, advised warm compresses, if it does not improve, start antibiotics

## 2011-10-25 ENCOUNTER — Ambulatory Visit (INDEPENDENT_AMBULATORY_CARE_PROVIDER_SITE_OTHER): Payer: BC Managed Care – PPO | Admitting: Family Medicine

## 2011-10-25 ENCOUNTER — Encounter: Payer: Self-pay | Admitting: Family Medicine

## 2011-10-25 VITALS — BP 108/62 | HR 66 | Resp 18 | Ht 65.0 in | Wt 140.0 lb

## 2011-10-25 DIAGNOSIS — I959 Hypotension, unspecified: Secondary | ICD-10-CM

## 2011-10-25 DIAGNOSIS — R5383 Other fatigue: Secondary | ICD-10-CM

## 2011-10-25 DIAGNOSIS — Z8673 Personal history of transient ischemic attack (TIA), and cerebral infarction without residual deficits: Secondary | ICD-10-CM

## 2011-10-25 DIAGNOSIS — R42 Dizziness and giddiness: Secondary | ICD-10-CM

## 2011-10-25 DIAGNOSIS — R5381 Other malaise: Secondary | ICD-10-CM

## 2011-10-25 DIAGNOSIS — G459 Transient cerebral ischemic attack, unspecified: Secondary | ICD-10-CM

## 2011-10-25 NOTE — Patient Instructions (Signed)
MRI of brain to be done  Restart aspirin  Continue current dose of Florinef  Keep appt with neurology

## 2011-10-26 NOTE — Assessment & Plan Note (Addendum)
This is similar to her previous attacks, deemed TIA's, she has been on ASA and plavix and felt she had less episodes, will restart full dose ASA, repeat MRI of brain, appt with neurology in 2 weeks Lipids look good

## 2011-10-26 NOTE — Progress Notes (Signed)
  Subjective:    Patient ID: Candice Estrada, female    DOB: 11/14/1975, 36 y.o.   MRN: [id number]  HPI This am pt was driving to work and experience another of her "episodes" where she became dizzy and felt tingling on her face and numbness, she also thought her eye looked smaller as well. She pulled over and rested but did not go to ER, She proceeded to work where she then experienced the severe fatigue which typically follows. She has been dizzy on and off all day. She took her florinef and felt somewhat better. She has a lot of stress with a sick daughter, separation from her husband and new relationship. Episodes have been increasing. She did call neurologist who told her to go to ER or her PCP office. SHe did take 1 day of keflex and is on flagyl currently for BV Labs from 2 days ago unremarkable   Review of Systems  GEN- + fatigue, fever, weight loss,weakness, recent illness HEENT- denies eye drainage, change in vision, nasal discharge, CVS- denies chest pain, palpitations RESP- denies SOB, cough, wheeze ABD- denies N/V, change in stools, abd pain GU- denies dysuria, hematuria, dribbling, incontinence MSK- denies joint pain, muscle aches, injury Neuro-+ headache, +dizziness, syncope, seizure activity      Objective:   Physical Exam GEN- NAD, alert and oriented x3, no orthostatic hypotension HEENT- PERRL, EOMI, non injected sclera, pink conjunctiva, MMM, oropharynx clear Neck- Supple,  CVS- RRR, no murmur RESP-CTAB EXT- No edema Pulses- Radial, DP- 2+ Neuro- CNII-XII in tact, no focal deficits        Assessment & Plan:

## 2011-10-26 NOTE — Assessment & Plan Note (Signed)
No orthostasis, she does improve with florinef in AM, will keep current dose as not severely hypotensive today and she has had electrolyte problems in the past.

## 2011-10-26 NOTE — Assessment & Plan Note (Signed)
Post episode fatigue per above

## 2011-10-26 NOTE — Assessment & Plan Note (Signed)
Differentials for these episdoes, TIA,vs cardiac, though no CP, no arrythmia has been found, BP is okay. Anxiety/stress related. If her MRI and Neuro appt reveal no cause, will start her on trial of SSRI

## 2011-10-28 ENCOUNTER — Telehealth: Payer: Self-pay | Admitting: Family Medicine

## 2011-10-28 ENCOUNTER — Ambulatory Visit (HOSPITAL_COMMUNITY)
Admission: RE | Admit: 2011-10-28 | Discharge: 2011-10-28 | Disposition: A | Discharge status: Home / Self Care | Payer: BC Managed Care – PPO | Source: Ambulatory Visit | Attending: Family Medicine | Admitting: Family Medicine

## 2011-10-28 ENCOUNTER — Ambulatory Visit (HOSPITAL_COMMUNITY): Payer: BC Managed Care – PPO

## 2011-10-28 DIAGNOSIS — R42 Dizziness and giddiness: Secondary | ICD-10-CM | POA: Insufficient documentation

## 2011-10-28 DIAGNOSIS — Z8673 Personal history of transient ischemic attack (TIA), and cerebral infarction without residual deficits: Secondary | ICD-10-CM

## 2011-10-28 DIAGNOSIS — R51 Headache: Secondary | ICD-10-CM | POA: Insufficient documentation

## 2011-10-28 MED ORDER — DIAZEPAM 5 MG PO TABS: ORAL_TABLET | ORAL | Status: DC

## 2011-10-28 NOTE — Telephone Encounter (Signed)
Medication called in 

## 2011-10-29 ENCOUNTER — Ambulatory Visit (INDEPENDENT_AMBULATORY_CARE_PROVIDER_SITE_OTHER): Payer: BC Managed Care – PPO | Admitting: Cardiovascular Disease

## 2011-10-29 ENCOUNTER — Other Ambulatory Visit (HOSPITAL_COMMUNITY): Payer: BC Managed Care – PPO

## 2011-10-29 ENCOUNTER — Encounter: Payer: Self-pay | Admitting: Cardiovascular Disease

## 2011-10-29 ENCOUNTER — Encounter (INDEPENDENT_AMBULATORY_CARE_PROVIDER_SITE_OTHER): Payer: BC Managed Care – PPO | Admitting: Nurse Practitioner

## 2011-10-29 ENCOUNTER — Other Ambulatory Visit: Payer: Self-pay

## 2011-10-29 VITALS — BP 106/78 | HR 77 | Ht 63.0 in | Wt 144.8 lb

## 2011-10-29 DIAGNOSIS — G459 Transient cerebral ischemic attack, unspecified: Secondary | ICD-10-CM

## 2011-10-29 DIAGNOSIS — Q211 Atrial septal defect: Secondary | ICD-10-CM

## 2011-10-29 NOTE — Patient Instructions (Addendum)
Your physician has requested that you have an echocardiogram. Echocardiography is a painless test that uses sound waves to create images of your heart. It provides your doctor with information about the size and shape of your heart and how well your heart's chambers and valves are working. This procedure takes approximately one hour. There are no restrictions for this procedure.  Your physician recommends that you schedule a follow-up appointment in: 3 months  

## 2011-10-29 NOTE — Progress Notes (Signed)
   HPI:  36 year old woman presenting for followup evaluation. The patient underwent transcatheter ASD closure in 2012. She initially presented with a TIA and was noted to have a small ostium secundum ASD. Unfortunately she has continued to have a multitude of symptoms. She recently has developed severe dizziness. Her dizziness is constant and it's actually worse with lying down. She has not had recent postural symptoms with standing. She complains of palpitations but there has been no recent increase. She's had no chest pain or pressure, syncope, or dyspnea. She has noted some leg swelling that she attributes to Florinef. She's had some recurrent left facial numbness. She denies symptoms of amaurosis fugax, numbness/tingling/weakness of her extremities, or expressive aphasia.  Outpatient Encounter Prescriptions as of 10/29/2011  Medication Sig Dispense Refill  . fludrocortisone (FLORINEF) 0.1 MG tablet Take 1 tablet (0.1 mg total) by mouth daily.  30 tablet  6  . ibuprofen (ADVIL,MOTRIN) 800 MG tablet Take by mouth as needed.       . Multiple Vitamin (MULTIVITAMIN) tablet Take 1 tablet by mouth daily.      . diazepam (VALIUM) 5 MG tablet Take 1 hour before procedure  1 tablet  0  . DISCONTD: cephALEXin (KEFLEX) 500 MG capsule Take 1 capsule (500 mg total) by mouth 2 (two) times daily.  10 capsule  0  . DISCONTD: metroNIDAZOLE (FLAGYL) 500 MG tablet Take 1 tablet (500 mg total) by mouth 2 (two) times daily.  14 tablet  0    No Known Allergies  Past Medical History  Diagnosis Date  . Ovarian cyst   . Urinary incontinence   . Migraine headache   . Anxiety   . ASD (atrial septal defect), ostium secundum     Status post closure 08/20/10 with Dr. Excell Seltzer; procedure complicated by right groin hematoma and acute blood loss anemia  . History of TIAs   . Vitamin B 12 deficiency   . Stroke   . H. pylori infection 2012  . HSV-1 (herpes simplex virus 1) infection 2013    Positive antibiotidies -no  history of outbreak     ROS: Negative except as per HPI  BP 106/78  Pulse 77  Ht 5\' 3"  (1.6 m)  Wt 65.681 kg (144 lb 12.8 oz)  BMI 25.65 kg/m2  SpO2 99%  LMP 10/15/2011  PHYSICAL EXAM: Pt is alert and oriented, NAD HEENT: normal Neck: JVP - normal, carotids 2+= without bruits Lungs: CTA bilaterally CV: RRR without murmur or gallop Abd: soft, NT, Positive BS, no hepatomegaly Ext: no C/C/E, distal pulses intact and equal Skin: warm/dry no rash  EKG:  Normal sinus rhythm 75 beats per minute, within normal limits the  ASSESSMENT AND PLAN: Ostium secundum ASD status post transcatheter closure. I think the likelihood of persistent symptoms related to transient right to left shunting is low. However, considering her continued symptoms, I am going to order an echocardiogram with agitated saline to evaluate for right to left shunt. If she has residual shunt physiology, will consider a transesophageal echo. I did not make any changes in her medical program today. Note she has upcoming followup with neurology and she had a brain MRI done yesterday.  Tonny Bollman 10/29/2011 1:14 PM

## 2011-10-30 ENCOUNTER — Other Ambulatory Visit: Payer: Self-pay

## 2011-10-30 ENCOUNTER — Ambulatory Visit (HOSPITAL_COMMUNITY): Payer: BC Managed Care – PPO | Attending: Internal Medicine | Admitting: Radiology

## 2011-10-30 ENCOUNTER — Encounter (HOSPITAL_COMMUNITY): Payer: Self-pay

## 2011-10-30 DIAGNOSIS — R002 Palpitations: Secondary | ICD-10-CM | POA: Insufficient documentation

## 2011-10-30 DIAGNOSIS — R55 Syncope and collapse: Secondary | ICD-10-CM | POA: Insufficient documentation

## 2011-10-30 DIAGNOSIS — Q2111 Secundum atrial septal defect: Secondary | ICD-10-CM | POA: Insufficient documentation

## 2011-10-30 DIAGNOSIS — Q211 Atrial septal defect: Secondary | ICD-10-CM

## 2011-10-30 NOTE — Progress Notes (Signed)
This encounter was created in error - please disregard.

## 2011-10-30 NOTE — Progress Notes (Signed)
Echocardiogram performed.  

## 2011-10-30 NOTE — Progress Notes (Signed)
Patient ID: Candice Estrada, female   DOB: 03-29-1975, 37 y.o.   MRN: [id number] IV with 20 G angiocath (R) AC, tolerated well, site unremarkable for Echo Bubble Study. Irean Hong, RN.

## 2011-11-18 ENCOUNTER — Other Ambulatory Visit: Payer: Self-pay | Admitting: Adult Health

## 2011-11-18 ENCOUNTER — Other Ambulatory Visit (HOSPITAL_COMMUNITY)
Admission: RE | Admit: 2011-11-18 | Discharge: 2011-11-18 | Disposition: A | Discharge status: Home / Self Care | Payer: BC Managed Care – PPO | Source: Ambulatory Visit | Attending: Obstetrics and Gynecology | Admitting: Obstetrics and Gynecology

## 2011-11-18 DIAGNOSIS — Z1151 Encounter for screening for human papillomavirus (HPV): Secondary | ICD-10-CM | POA: Insufficient documentation

## 2011-11-18 DIAGNOSIS — Z01419 Encounter for gynecological examination (general) (routine) without abnormal findings: Secondary | ICD-10-CM | POA: Insufficient documentation

## 2011-12-02 ENCOUNTER — Telehealth: Payer: Self-pay | Admitting: Family Medicine

## 2011-12-02 NOTE — Telephone Encounter (Signed)
Patient states that the medicine was originally ordered by cardiology.  It was Lexapro 20mg .  She states that it was prescribed in effort to help with dizziness.  She is wondering if you agree with her restarting this or what do you think about Effexor.  Please advise.

## 2011-12-03 MED ORDER — ESCITALOPRAM OXALATE 20 MG PO TABS: 20.0000 mg | ORAL_TABLET | Freq: Every day | ORAL | Status: DC

## 2011-12-03 NOTE — Telephone Encounter (Signed)
Called and left voicemail for patient with instructions.

## 2011-12-03 NOTE — Telephone Encounter (Signed)
Start lexapro 1/2 tablet for 1 week, then increase to 1 tablet daily

## 2011-12-06 ENCOUNTER — Ambulatory Visit: Payer: BC Managed Care – PPO | Admitting: Internal Medicine

## 2011-12-18 ENCOUNTER — Ambulatory Visit: Payer: BC Managed Care – PPO | Admitting: Internal Medicine

## 2012-01-29 ENCOUNTER — Encounter: Payer: Self-pay | Admitting: Internal Medicine

## 2012-02-04 ENCOUNTER — Ambulatory Visit: Payer: BC Managed Care – PPO | Admitting: Cardiovascular Disease

## 2012-02-14 ENCOUNTER — Other Ambulatory Visit: Payer: Self-pay | Admitting: Cardiovascular Disease

## 2012-02-17 ENCOUNTER — Encounter: Payer: Self-pay | Admitting: Family Medicine

## 2012-02-17 ENCOUNTER — Ambulatory Visit (INDEPENDENT_AMBULATORY_CARE_PROVIDER_SITE_OTHER): Payer: BC Managed Care – PPO | Admitting: Family Medicine

## 2012-02-17 VITALS — BP 100/78 | HR 79 | Resp 16 | Ht 65.0 in | Wt 142.0 lb

## 2012-02-17 DIAGNOSIS — Z130 Encounter for screening for diseases of the blood and blood-forming organs and certain disorders involving the immune mechanism: Secondary | ICD-10-CM

## 2012-02-17 DIAGNOSIS — F411 Generalized anxiety disorder: Secondary | ICD-10-CM

## 2012-02-17 DIAGNOSIS — R209 Unspecified disturbances of skin sensation: Secondary | ICD-10-CM

## 2012-02-17 DIAGNOSIS — Q2111 Secundum atrial septal defect: Secondary | ICD-10-CM

## 2012-02-17 DIAGNOSIS — R42 Dizziness and giddiness: Secondary | ICD-10-CM

## 2012-02-17 DIAGNOSIS — Q211 Atrial septal defect: Secondary | ICD-10-CM

## 2012-02-17 DIAGNOSIS — R202 Paresthesia of skin: Secondary | ICD-10-CM

## 2012-02-17 DIAGNOSIS — G459 Transient cerebral ischemic attack, unspecified: Secondary | ICD-10-CM

## 2012-02-17 DIAGNOSIS — Z1321 Encounter for screening for nutritional disorder: Secondary | ICD-10-CM

## 2012-02-17 MED ORDER — IBUPROFEN 800 MG PO TABS: 800.0000 mg | ORAL_TABLET | ORAL | Status: DC | PRN

## 2012-02-17 MED ORDER — ESCITALOPRAM OXALATE 20 MG PO TABS: 10.0000 mg | ORAL_TABLET | Freq: Every day | ORAL | Status: DC

## 2012-02-17 NOTE — Assessment & Plan Note (Signed)
While I do not think that all of her symptoms are due to anxiety this is playing a factor especially with her ongoing symptoms that we do not have a very good diagnosis for. She will go ahead and restart the Lexapro and she has done well on this in the past he was on her 10 mg. She has a lot of stress dealing with her children her grandmother recently passed away and her job is quite stressful as well.

## 2012-02-17 NOTE — Assessment & Plan Note (Signed)
Reviewed last cardiology note bubble study which was repeated in December was normal

## 2012-02-17 NOTE — Progress Notes (Signed)
  Subjective:    Patient ID: Candice Estrada, female    DOB: Jun 27, 1975, 37 y.o.   MRN: [id number]  HPI Patient here to follow chronic medical problems. She continues to have daily symptoms of anxiety and dizziness as before ( for approx 2 years now). She did followup with neurology who ordered another bubble study and was told that this was normal. She was then referred to ear nose and throat who did not give an actual diagnosis , but gave possibility of of Mnire's disease vs silent migraine. She did try taking Lasix however has felt a little flushed when doing this. She has stopped the Florinef she never started the Lexapro for possible anxiety and depression contributing although she does remember being on this in the past and it helped her symptoms. She is exercising daily and trying to eat well. She is very frustrated that her symptoms have not been controlled. She's had EEG MRI all which have been unremarkable.   Review of Systems  GEN- denies fatigue, fever, weight loss,weakness, recent illness HEENT- denies eye drainage, change in vision, nasal discharge, CVS- denies chest pain, palpitations RESP- denies SOB, cough, wheeze ABD- denies N/V, change in stools, abd pain Neuro- +headache, +dizziness, syncope, seizure activity      Objective:   Physical Exam GEN- NAD, alert and oriented x3,  HEENT- PERRL, EOMI, non injected sclera, pink conjunctiva, MMM, oropharynx clear Neck- Supple,  CVS- RRR, no murmur RESP-CTAB EXT- No edema Pulses- Radial, DP- 2+ Psych- not overly anxious or depressed         Assessment & Plan:

## 2012-02-17 NOTE — Assessment & Plan Note (Signed)
She's not had any other major episodes similar to her diagnosed TIAs in the past. She's not had any syncope since her last visit. She's not on any medications

## 2012-02-17 NOTE — Patient Instructions (Signed)
Get the labs done  Restart lexapro Referral to [facility] for 2nd opinion F/U 4 months

## 2012-02-17 NOTE — Assessment & Plan Note (Addendum)
Persistent dizzy episodes she's been worked up from seizure, orthostatic hypotension POTS disease, and recurrent TIAs. We will send her [facility]. She does continue to get these paresthesias associated with her dizzy episodes as well so I will obtain a B12 level as I have not seen this done as well as a vitamin D

## 2012-02-19 ENCOUNTER — Other Ambulatory Visit: Payer: Self-pay | Admitting: *Deleted

## 2012-02-20 ENCOUNTER — Telehealth: Payer: Self-pay | Admitting: Family Medicine

## 2012-02-20 NOTE — Telephone Encounter (Signed)
Please advise. Should she break in half and try that?

## 2012-02-20 NOTE — Telephone Encounter (Signed)
She can try to take a 1/4 of a tablet but this will be hard to do, you can send in 10mg  tablets for her to break in half. I think she is anxious about taking the medicine in general, tell her to stop all together, and start with the 5mg  on Monday, if she has the same symptoms, stop and let me know

## 2012-02-20 NOTE — Telephone Encounter (Signed)
Patient already has 10 mg will take 5mg  on Monday as instructed

## 2012-02-29 ENCOUNTER — Other Ambulatory Visit: Payer: Self-pay

## 2012-03-10 ENCOUNTER — Telehealth: Payer: Self-pay | Admitting: Family Medicine

## 2012-03-10 MED ORDER — VENLAFAXINE HCL ER 37.5 MG PO CP24: 37.5000 mg | ORAL_CAPSULE | Freq: Every day | ORAL | Status: DC

## 2012-03-10 NOTE — Telephone Encounter (Signed)
I do not think wellbutrin will help as she has a lot of anxiety, will try Effexor once a day I called and left message on patient cell phone with instructions We will stop the lexapro  F/U 4 weeks  Unable to reach at work

## 2012-03-13 NOTE — Telephone Encounter (Signed)
Called and left message for patient to return call.  

## 2012-03-16 NOTE — Telephone Encounter (Signed)
Patient aware.

## 2012-03-18 ENCOUNTER — Encounter: Payer: Self-pay | Admitting: Internal Medicine

## 2012-03-24 ENCOUNTER — Encounter: Payer: Self-pay | Admitting: Cardiovascular Disease

## 2012-04-19 ENCOUNTER — Encounter (HOSPITAL_COMMUNITY): Payer: Self-pay | Admitting: *Deleted

## 2012-04-19 ENCOUNTER — Emergency Department (HOSPITAL_COMMUNITY)
Admission: EM | Admit: 2012-04-19 | Discharge: 2012-04-19 | Disposition: A | Discharge status: Home / Self Care | Payer: BC Managed Care – PPO | Attending: Emergency Medicine | Admitting: Emergency Medicine

## 2012-04-19 DIAGNOSIS — Z79899 Other long term (current) drug therapy: Secondary | ICD-10-CM | POA: Insufficient documentation

## 2012-04-19 DIAGNOSIS — R410 Disorientation, unspecified: Secondary | ICD-10-CM

## 2012-04-19 DIAGNOSIS — Z87448 Personal history of other diseases of urinary system: Secondary | ICD-10-CM | POA: Insufficient documentation

## 2012-04-19 DIAGNOSIS — Z862 Personal history of diseases of the blood and blood-forming organs and certain disorders involving the immune mechanism: Secondary | ICD-10-CM | POA: Insufficient documentation

## 2012-04-19 DIAGNOSIS — Z7982 Long term (current) use of aspirin: Secondary | ICD-10-CM | POA: Insufficient documentation

## 2012-04-19 DIAGNOSIS — F411 Generalized anxiety disorder: Secondary | ICD-10-CM | POA: Insufficient documentation

## 2012-04-19 DIAGNOSIS — Z8673 Personal history of transient ischemic attack (TIA), and cerebral infarction without residual deficits: Secondary | ICD-10-CM | POA: Insufficient documentation

## 2012-04-19 DIAGNOSIS — Z8742 Personal history of other diseases of the female genital tract: Secondary | ICD-10-CM | POA: Insufficient documentation

## 2012-04-19 DIAGNOSIS — Z8639 Personal history of other endocrine, nutritional and metabolic disease: Secondary | ICD-10-CM | POA: Insufficient documentation

## 2012-04-19 DIAGNOSIS — F29 Unspecified psychosis not due to a substance or known physiological condition: Secondary | ICD-10-CM | POA: Insufficient documentation

## 2012-04-19 DIAGNOSIS — Z8679 Personal history of other diseases of the circulatory system: Secondary | ICD-10-CM | POA: Insufficient documentation

## 2012-04-19 DIAGNOSIS — Z8619 Personal history of other infectious and parasitic diseases: Secondary | ICD-10-CM | POA: Insufficient documentation

## 2012-04-19 DIAGNOSIS — Z9889 Other specified postprocedural states: Secondary | ICD-10-CM | POA: Insufficient documentation

## 2012-04-19 LAB — CBC WITH DIFFERENTIAL/PLATELET
Basophils Absolute: 0 10*3/uL (ref 0.0–0.1)
HCT: 39.9 % (ref 36.0–46.0)
Hemoglobin: 13.8 g/dL (ref 12.0–15.0)
Lymphocytes Relative: 24 % (ref 12–46)
Lymphs Abs: 1.4 10*3/uL (ref 0.7–4.0)
Monocytes Absolute: 0.5 10*3/uL (ref 0.1–1.0)
Neutro Abs: 3.8 10*3/uL (ref 1.7–7.7)
RBC: 4.51 MIL/uL (ref 3.87–5.11)
RDW: 13.1 % (ref 11.5–15.5)
WBC: 5.8 10*3/uL (ref 4.0–10.5)

## 2012-04-19 LAB — BASIC METABOLIC PANEL
CO2: 29 mEq/L (ref 19–32)
Chloride: 106 mEq/L (ref 96–112)
Creatinine, Ser: 0.8 mg/dL (ref 0.50–1.10)
Glucose, Bld: 103 mg/dL — ABNORMAL HIGH (ref 70–99)

## 2012-04-19 LAB — URINALYSIS, ROUTINE W REFLEX MICROSCOPIC
Glucose, UA: NEGATIVE mg/dL
Hgb urine dipstick: NEGATIVE
Protein, ur: NEGATIVE mg/dL
Specific Gravity, Urine: 1.008 (ref 1.005–1.030)
pH: 7 (ref 5.0–8.0)

## 2012-04-19 LAB — URINE MICROSCOPIC-ADD ON

## 2012-04-19 NOTE — ED Provider Notes (Signed)
History     CSN: [id number]  Arrival date & time 04/19/12  1640   First MD Initiated Contact with Patient 04/19/12 1644      Chief Complaint  Patient presents with  . Dizziness    (Consider location/radiation/quality/duration/timing/severity/associated sxs/prior treatment) HPI Comments: 37 yo female presenting with 3 episodes of confusion and "feeling like I'm outside of my body".  Each episode has lasted several minutes and then resolved spontaneously.  She denies having dizziness during these episodes.  She does not feel like she was going to pass out.  She also denies chest pain, shortness of breath, fevers, sweats, nausea during the episodes.  No focal weakness or numbness.  She is asymptomatic in the interim.  This is described as different than her multiple other medical problems of dizziness, stroke, and low blood pressures.     Past Medical History  Diagnosis Date  . Ovarian cyst   . Urinary incontinence   . Migraine headache   . Anxiety   . ASD (atrial septal defect), ostium secundum     Status post closure 08/20/10 with Dr. Excell Seltzer; procedure complicated by right groin hematoma and acute blood loss anemia  . History of TIAs   . Vitamin B 12 deficiency   . Stroke   . H. pylori infection 2012  . HSV-1 (herpes simplex virus 1) infection 2013    Positive antibiotidies -no history of outbreak     Past Surgical History  Procedure Laterality Date  . Thyroid cyst rem    . Tonsillectomy    . Foot neuroma surgery  2009    left  . Thyroid cyst excision    . Asd repair  08/20/2010  . Cardiac surgery      Family History  Problem Relation Age of Onset  . Hypertension Father 55  . Stroke Father   . Heart attack Father   . Brain cancer Father   . Heart disease Father   . Diabetes Father   . Liver disease Mother 56    dead  . Hypertension Brother 35  . Breast cancer Other     PATERNAL AUNT    History  Substance Use Topics  . Smoking status: Never Smoker   . Smokeless  tobacco: Never Used  . Alcohol Use: Yes     Comment: very rarely    OB History   Grav Para Term Preterm Abortions TAB SAB Ect Mult Living                  Review of Systems  Constitutional: Negative for fever.  HENT: Negative for congestion.   Respiratory: Negative for cough and shortness of breath.   Cardiovascular: Negative for chest pain.  Gastrointestinal: Negative for nausea, vomiting, abdominal pain and diarrhea.  Genitourinary: Negative for frequency, difficulty urinating and menstrual problem (LMP 2 weeks ago).  All other systems reviewed and are negative.    Allergies  Review of patient's allergies indicates no known allergies.  Home Medications   Current Outpatient Rx  Name  Route  Sig  Dispense  Refill  . aspirin EC 81 MG tablet   Oral   Take 81 mg by mouth daily.         . Multiple Vitamin (MULTIVITAMIN WITH MINERALS) TABS   Oral   Take 1 tablet by mouth daily.         . Probiotic Product (PROBIOTIC PO)   Oral   Take 1 capsule by mouth daily.  BP 116/71  Pulse 83  Temp(Src) 97.6 F (36.4 C) (Oral)  Resp 16  SpO2 100%  LMP 04/05/2012  Physical Exam  Nursing note and vitals reviewed. Constitutional: She is oriented to person, place, and time. She appears well-developed and well-nourished. No distress.  HENT:  Head: Normocephalic and atraumatic.  Mouth/Throat: Oropharynx is clear and moist.  Eyes: Conjunctivae are normal. Pupils are equal, round, and reactive to light. No scleral icterus.  Neck: Neck supple.  Cardiovascular: Normal rate, regular rhythm, normal heart sounds and intact distal pulses.   No murmur heard. Pulmonary/Chest: Effort normal and breath sounds normal. No stridor. No respiratory distress. She has no rales.  Abdominal: Soft. Bowel sounds are normal. She exhibits no distension. There is no tenderness.  Musculoskeletal: Normal range of motion.  Neurological: She is alert and oriented to person, place, and time.  She has normal strength and normal reflexes. No cranial nerve deficit or sensory deficit. She displays a negative Romberg sign. Coordination and gait normal. GCS eye subscore is 4. GCS verbal subscore is 5. GCS motor subscore is 6.  Skin: Skin is warm and dry. No rash noted.  Psychiatric: She has a normal mood and affect. Her behavior is normal.    ED Course  Procedures (including critical care time)  Labs Reviewed  URINALYSIS, ROUTINE W REFLEX MICROSCOPIC - Abnormal; Notable for the following:    Leukocytes, UA TRACE (*)    All other components within normal limits  BASIC METABOLIC PANEL - Abnormal; Notable for the following:    Glucose, Bld 103 (*)    All other components within normal limits  URINE MICROSCOPIC-ADD ON - Abnormal; Notable for the following:    Squamous Epithelial / LPF FEW (*)    All other components within normal limits  CBC WITH DIFFERENTIAL   No results found.   1. Confusion       MDM   37 yo female with several episodes of feeling outside her body.  Asymptomatic now.  History does not sound consistent with TIA, which she has suffered with in the past.  Also does not sound consistent with syncope or presyncope, which she has also experienced.  Possibly related to a diet pill which she started taking one and a half weeks ago.  Well appearing, normal exam.  Plan to check basic labs, urine, and have her discontinue diet pill.    7:57 PM labs were unremarkable.  Pt remained well appearing.  dc'd home.       Rennis Petty, MD 04/19/12 [phone]

## 2012-04-19 NOTE — ED Notes (Signed)
Pt remains alert and oriented with family at bedside.  St's she has not had any more episodes.  St's last episode was approx 5 hours ago.  Pt only c/o feeling tired.

## 2012-04-19 NOTE — ED Notes (Signed)
The patient is AOx4 and comfortable with her discharge instructions.  Her significant other is driving her home.

## 2012-04-19 NOTE — ED Provider Notes (Signed)
I saw and evaluated the patient, reviewed the resident's note and I agree with the findings and plan.  Pt with feeling out of her head, h/o TIA associated with ASD which has been repaired.     Charles B. Bernette Mayers, MD 04/19/12 2002

## 2012-04-19 NOTE — ED Notes (Signed)
Reports having three episodes today since 1100 of periods of feeling confused and having dizziness.

## 2012-05-12 ENCOUNTER — Encounter: Payer: Self-pay | Admitting: Cardiovascular Disease

## 2012-05-12 ENCOUNTER — Ambulatory Visit (INDEPENDENT_AMBULATORY_CARE_PROVIDER_SITE_OTHER): Payer: BC Managed Care – PPO | Admitting: Cardiovascular Disease

## 2012-05-12 VITALS — BP 110/64 | HR 65 | Ht 62.0 in | Wt 143.0 lb

## 2012-05-12 DIAGNOSIS — Q211 Atrial septal defect: Secondary | ICD-10-CM

## 2012-05-12 DIAGNOSIS — R55 Syncope and collapse: Secondary | ICD-10-CM

## 2012-05-12 MED ORDER — FLUDROCORTISONE ACETATE 0.1 MG PO TABS: 0.1000 mg | ORAL_TABLET | Freq: Two times a day (BID) | ORAL | Status: DC

## 2012-05-12 NOTE — Progress Notes (Signed)
   HPI:  37 year old woman presenting for followup evaluation. The patient underwent transcatheter ASD closure in 2012. She initially presented with a TIA and was noted to have a small ostium secundum ASD.  She unfortunately continues to have a great deal of difficulty with recurrent lightheadedness and syncope. She has a prodrome of dizziness and 'out of body' feeling before syncope occurs so she has not incurred any injury. She denies chest pain and has rare palpitations but not as bad as in past. She does not have postural symptoms, except feels dizzy with bending forward. She was unable to tolerate midodrine because of marked fatigue. Florinef caused edema. She has been seen by Dr Graciela Husbands with EP and has undergone multiple neurologic evaluations in [place] and at [facility]. Most recent TCD study was negative for residual shunt.   Outpatient Encounter Prescriptions as of 05/12/2012  Medication Sig Dispense Refill  . aspirin EC 81 MG tablet Take 81 mg by mouth daily.      . Multiple Vitamin (MULTIVITAMIN WITH MINERALS) TABS Take 1 tablet by mouth daily.      . Probiotic Product (PROBIOTIC PO) Take 1 capsule by mouth daily.       No facility-administered encounter medications on file as of 05/12/2012.    No Known Allergies  Past Medical History  Diagnosis Date  . Ovarian cyst   . Urinary incontinence   . Migraine headache   . Anxiety   . ASD (atrial septal defect), ostium secundum     Status post closure 08/20/10 with Dr. Excell Seltzer; procedure complicated by right groin hematoma and acute blood loss anemia  . History of TIAs   . Vitamin B 12 deficiency   . Stroke   . H. pylori infection 2012  . HSV-1 (herpes simplex virus 1) infection 2013    Positive antibiotidies -no history of outbreak     ROS: Negative except as per HPI  BP 110/64  Pulse 65  Ht 5\' 2"  (1.575 m)  Wt 64.864 kg (143 lb)  BMI 26.15 kg/m2  SpO2 99%  LMP 04/05/2012  PHYSICAL EXAM: Pt is alert and oriented,  NAD HEENT: normal Neck: JVP - normal, carotids 2+= without bruits Lungs: CTA bilaterally CV: RRR without murmur or gallop Abd: soft, NT, Positive BS, no hepatomegaly Ext: no C/C/E, distal pulses intact and equal Skin: warm/dry no rash  EKG:  NSR 65 bpm, within normal limits  ASSESSMENT AND PLAN: 1. ASD s/p transcatheter repair. No further problems. Follow-up imaging has shown no residual shunt and well-seated device. Continue ASA.  2. Syncope. 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 She is willing to take florinef and will resume at 0.1mg  BID. She responded well to this and states 'I can deal with the swelling.'  Will follow-up in 6 months.  Tonny Bollman 05/12/2012 10:21 AM

## 2012-05-12 NOTE — Patient Instructions (Addendum)
Your physician has recommended you make the following change in your medication: START Florinef 0.1mg  take one by mouth twice a day  Your physician wants you to follow-up in: 6 MONTHS with Dr Excell Seltzer.  You will receive a reminder letter in the mail two months in advance. If you don't receive a letter, please call our office to schedule the follow-up appointment.

## 2012-05-22 ENCOUNTER — Ambulatory Visit: Payer: BC Managed Care – PPO | Admitting: Family Medicine

## 2012-06-25 ENCOUNTER — Other Ambulatory Visit: Payer: Self-pay

## 2012-06-25 DIAGNOSIS — Z1231 Encounter for screening mammogram for malignant neoplasm of breast: Secondary | ICD-10-CM

## 2012-07-14 ENCOUNTER — Ambulatory Visit: Payer: Self-pay | Admitting: Adult Health

## 2012-07-27 ENCOUNTER — Telehealth: Payer: Self-pay | Admitting: Advanced Practice Midwife

## 2012-07-28 NOTE — Telephone Encounter (Signed)
Pt states has had IUD for several years now having abnormal bleeding. Pt call transferred to front staff to make an appt.

## 2012-08-03 ENCOUNTER — Ambulatory Visit (INDEPENDENT_AMBULATORY_CARE_PROVIDER_SITE_OTHER): Payer: BC Managed Care – PPO | Admitting: Adult Health

## 2012-08-03 ENCOUNTER — Encounter: Payer: Self-pay | Admitting: Adult Health

## 2012-08-03 ENCOUNTER — Other Ambulatory Visit: Payer: Self-pay | Admitting: Cardiovascular Disease

## 2012-08-03 VITALS — BP 110/60 | Ht 63.0 in | Wt 146.0 lb

## 2012-08-03 DIAGNOSIS — Z3202 Encounter for pregnancy test, result negative: Secondary | ICD-10-CM

## 2012-08-03 DIAGNOSIS — N926 Irregular menstruation, unspecified: Secondary | ICD-10-CM | POA: Insufficient documentation

## 2012-08-03 DIAGNOSIS — Z1389 Encounter for screening for other disorder: Secondary | ICD-10-CM

## 2012-08-03 LAB — POCT URINALYSIS DIPSTICK
Ketones, UA: NEGATIVE
Leukocytes, UA: NEGATIVE
Protein, UA: NEGATIVE

## 2012-08-03 NOTE — Addendum Note (Signed)
Addended by: Cyril Mourning A on: 08/03/2012 10:35 AM   Modules accepted: Orders

## 2012-08-03 NOTE — Patient Instructions (Addendum)
Keep period calendar and call if continues

## 2012-08-03 NOTE — Assessment & Plan Note (Signed)
Has ParaGard IUD will watch for now

## 2012-08-03 NOTE — Progress Notes (Signed)
Subjective:     Patient ID: Candice Estrada, female   DOB: 1975/06/26, 37 y.o.   MRN: [id number]  HPI Candice Estrada is a 37 year old white female in complaining of irregular bleeding with the ParaGard IUD for last 2 months, and some pain with sex.And period can be heavy at times, and may want more children. Had labs not long ago and TSH was normal, no new sex partners. She has a history of TIA/Stroke.  Review of Systems Positives in HPI Reviewed past medical,surgical, social and family history. Reviewed medications and allergies.     Objective:   Physical Exam BP 110/60  Ht 5\' 3"  (1.6 m)  Wt 146 lb (66.225 kg)  BMI 25.87 kg/m2  LMP 07/28/2012   Urine pregnancy test negative and dipstick negative. Skin warm and dry.Pelvic: external genitalia is normal in appearance, vagina: white discharge without odor, cervix:smooth and bulbous,+IUD strings at os uterus: normal size, shape and contour, non tender, no masses felt, adnexa: no masses or tenderness noted.  Assessment:    Irregular bleeding with ParaGard IUD     Plan:    Keep period calendar Call with persist bleeding   Follow up prn or at physical

## 2012-08-05 IMAGING — CT CT ABD-PELV W/O CM
2 of 4 series · 17 of 46 positions shown, 19 images · non-contrast
Comparison: 08/24/2010

CLINICAL DATA: Hematoma post catheterization

CT ABDOMEN AND PELVIS WITHOUT CONTRAST
TECHNIQUE: Multidetector CT imaging of the abdomen and pelvis was
performed following the standard protocol without intravenous
contrast.

[Series 2: ap without · axial · non-contrast · 0.76mm/px · z∈[+866,+1286]mm · 14 of 92 slices shown, 16 images]
[im 4/92  soft-tissue]
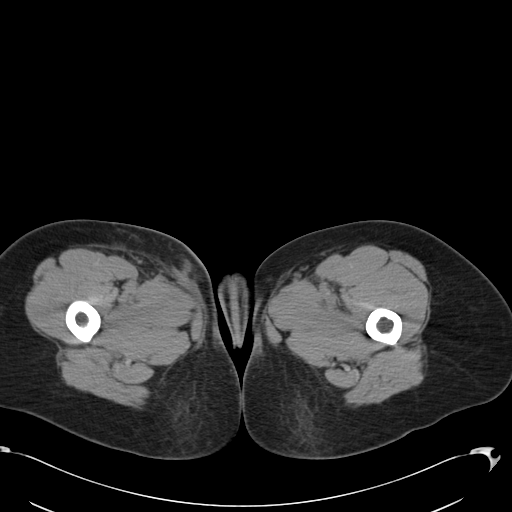
[im 4/92  bone]
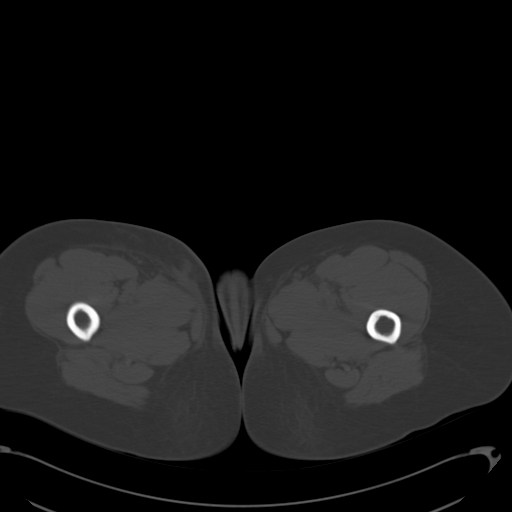
[im 11/92  soft-tissue]
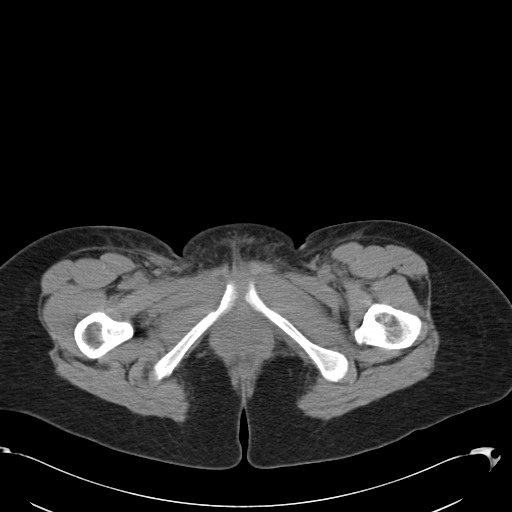
[im 18/92  soft-tissue]
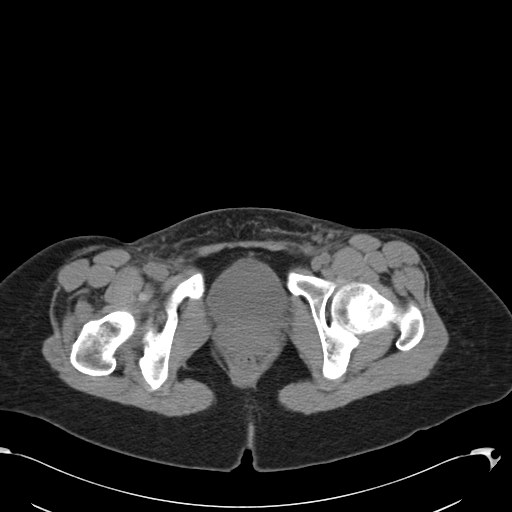
[im 25/92  soft-tissue]
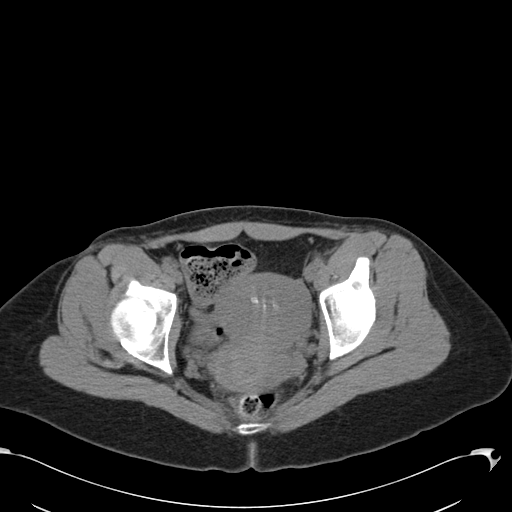
[im 32/92  soft-tissue]
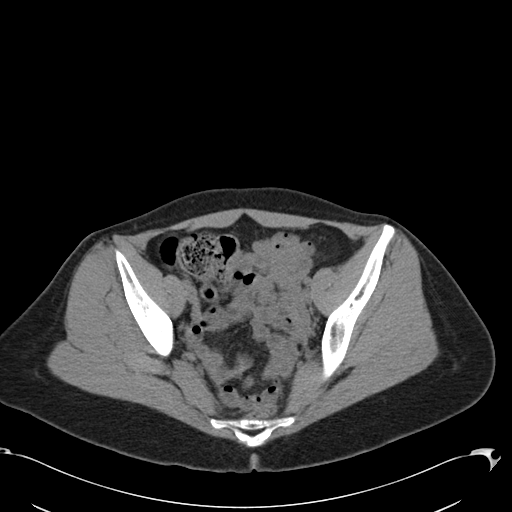
[im 36/92  soft-tissue]
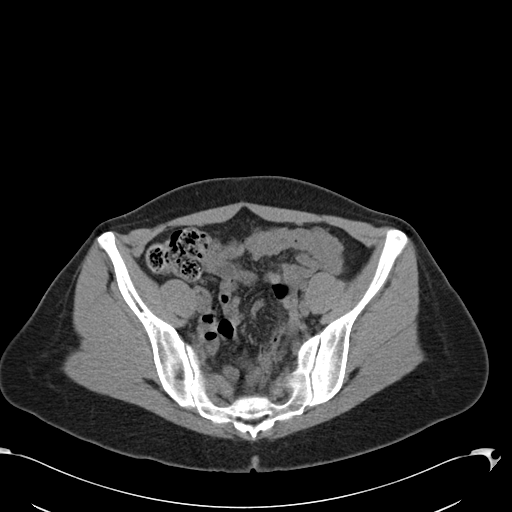
[im 43/92  soft-tissue]
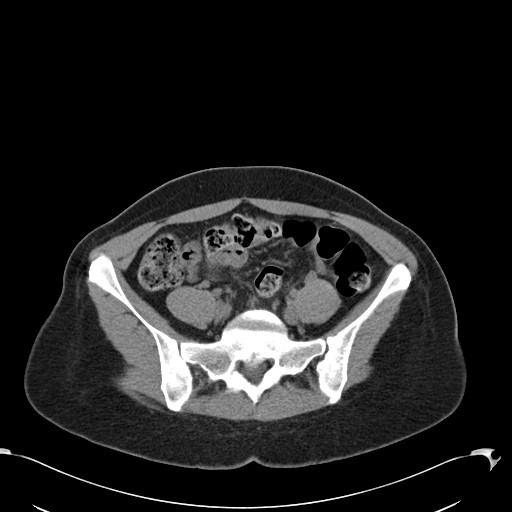
[im 50/92  soft-tissue]
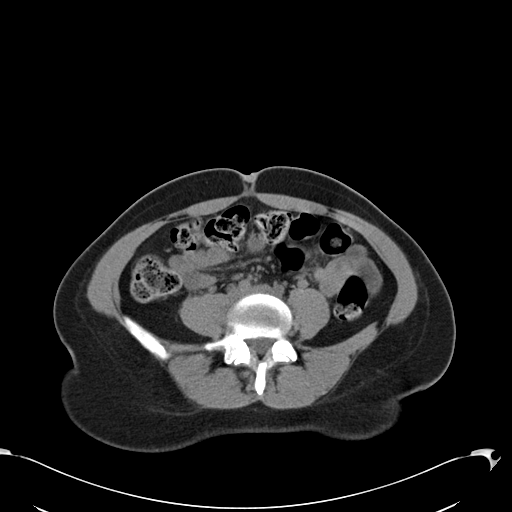
[im 57/92  soft-tissue]
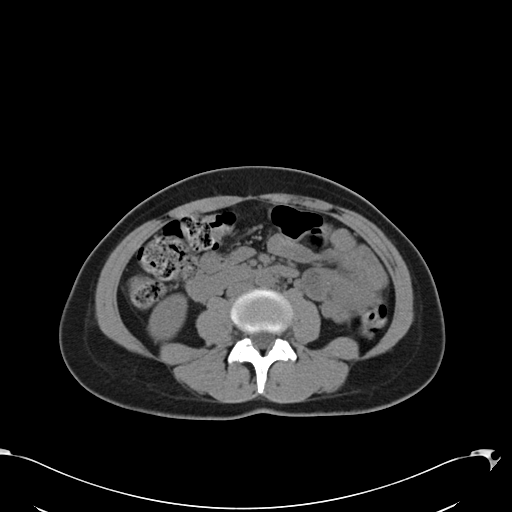
[im 57/92  bone]
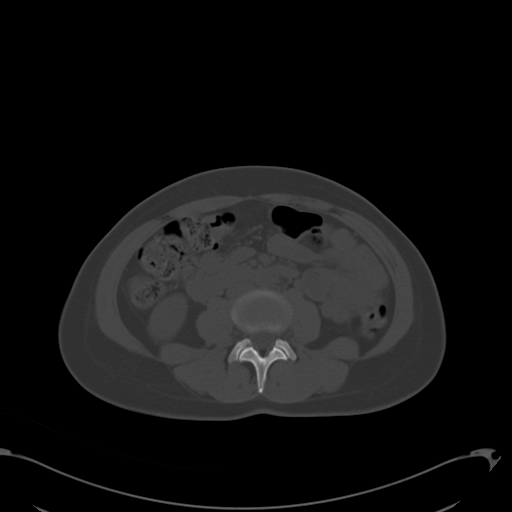
[im 60/92  soft-tissue]
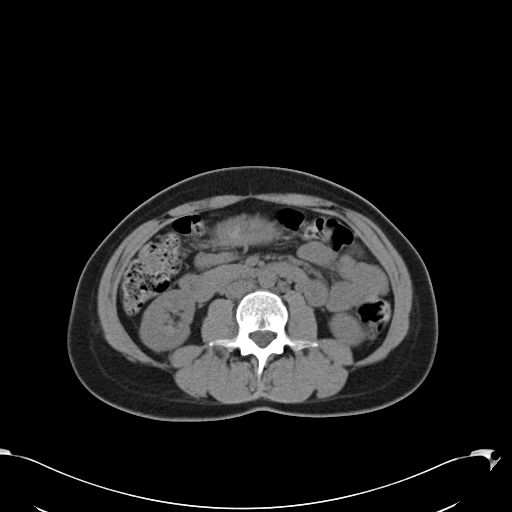
[im 67/92  soft-tissue]
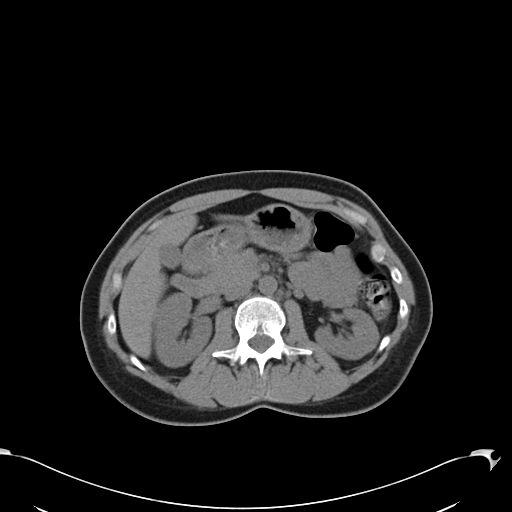
[im 74/92  soft-tissue]
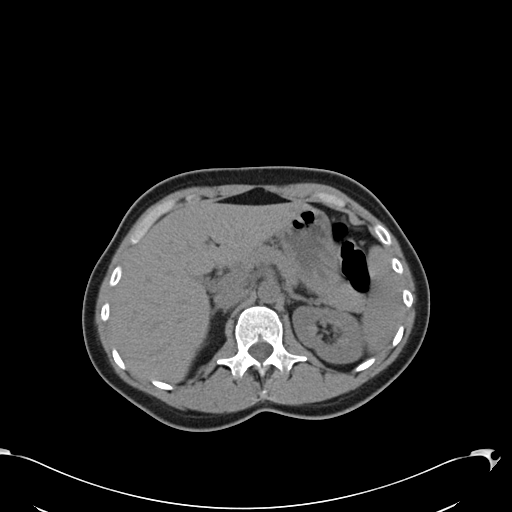
[im 81/92  soft-tissue]
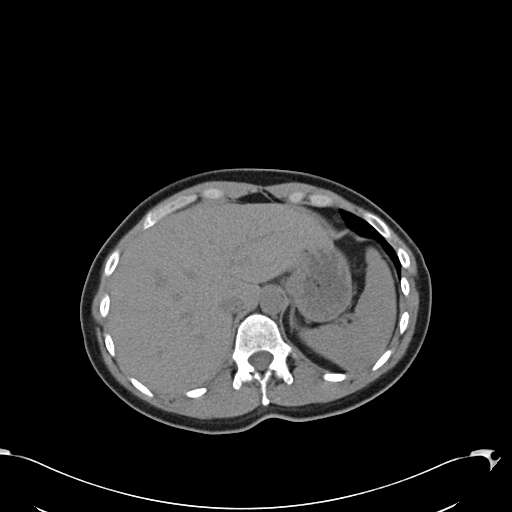
[im 88/92  soft-tissue]
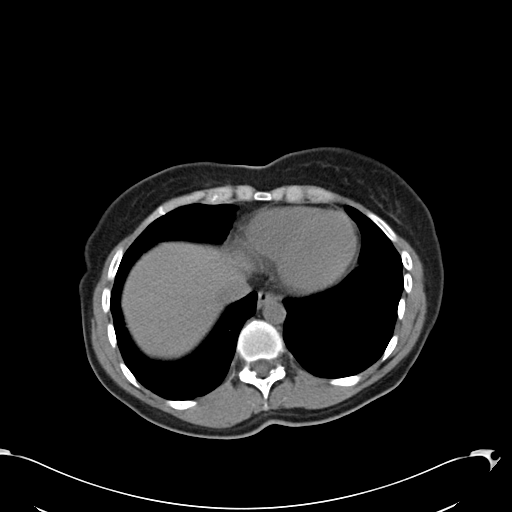

[Series 602: cor · coronal · 0.93mm/px · 3 of 111 slices shown]
[im 37/111  soft-tissue]
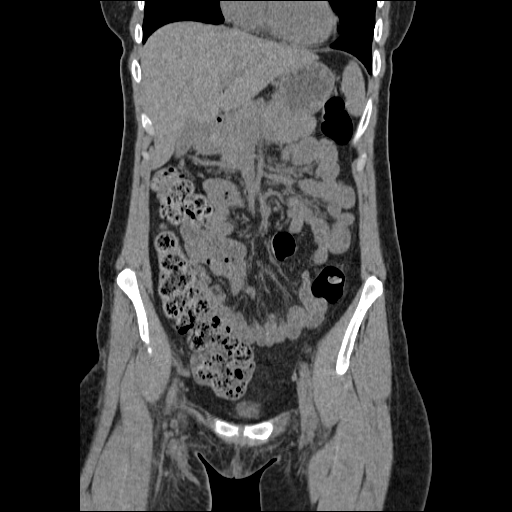
[im 49/111  soft-tissue]
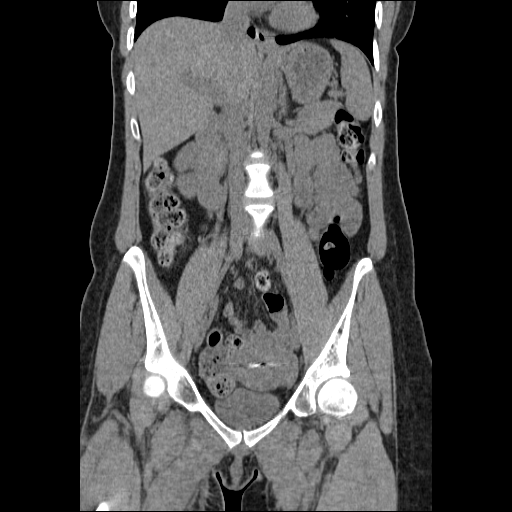
[im 62/111  soft-tissue]
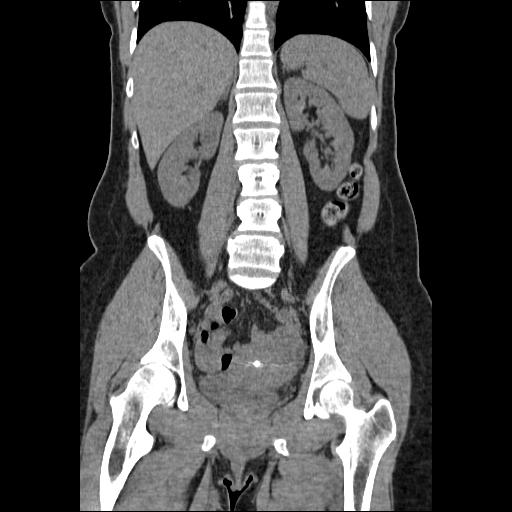

[17 of 46 positions shown; findings below may reference images not displayed]

FINDINGS: Visualized lung bases clear. Pleural effusions have
resolved.  Unremarkable uninfused evaluation of liver, gallbladder,
spleen, adrenal glands, left kidney, pancreas, abdominal aorta.
There is a 1-2 mm nonobstructing calculus in the right renal
collecting system, stable.  The stomach and small bowel
decompressed.  Appendix not discretely identified.  Moderate
proximal colonic fecal material, decompressed distally.  IUD in
place.  Adnexal regions unremarkable.  No ascites.  No free air.

Some decrease in the streaky subcutaneous
infiltrative/inflammatory/edematous changes overlying the common
femoral vessels and extending on the right into the proximal medial
thigh without discrete hematoma.
IMPRESSION: 1.  Negative for retroperitoneal hematoma.
2.  No acute abdominal or pelvic process.
3.  Right nephrolithiasis.

## 2012-08-31 ENCOUNTER — Ambulatory Visit: Payer: BC Managed Care – PPO | Admitting: Adult Health

## 2012-09-09 ENCOUNTER — Encounter: Payer: Self-pay | Admitting: Family Medicine

## 2012-09-09 ENCOUNTER — Ambulatory Visit (INDEPENDENT_AMBULATORY_CARE_PROVIDER_SITE_OTHER): Payer: BC Managed Care – PPO | Admitting: Family Medicine

## 2012-09-09 VITALS — BP 100/70 | HR 78 | Temp 97.3°F | Resp 18 | Ht 61.0 in | Wt 142.0 lb

## 2012-09-09 DIAGNOSIS — R002 Palpitations: Secondary | ICD-10-CM

## 2012-09-09 DIAGNOSIS — F411 Generalized anxiety disorder: Secondary | ICD-10-CM

## 2012-09-09 DIAGNOSIS — Z01419 Encounter for gynecological examination (general) (routine) without abnormal findings: Secondary | ICD-10-CM | POA: Insufficient documentation

## 2012-09-09 DIAGNOSIS — Z0000 Encounter for general adult medical examination without abnormal findings: Secondary | ICD-10-CM

## 2012-09-09 DIAGNOSIS — Z23 Encounter for immunization: Secondary | ICD-10-CM

## 2012-09-09 LAB — TSH: TSH: 1.231 u[IU]/mL (ref 0.350–4.500)

## 2012-09-09 LAB — LIPID PANEL: Total CHOL/HDL Ratio: 2.8 Ratio

## 2012-09-09 LAB — COMPREHENSIVE METABOLIC PANEL
ALT: 16 U/L (ref 0–35)
AST: 18 U/L (ref 0–37)
Albumin: 4.3 g/dL (ref 3.5–5.2)
Calcium: 9.1 mg/dL (ref 8.4–10.5)
Chloride: 107 mEq/L (ref 96–112)
Creat: 0.88 mg/dL (ref 0.50–1.10)
Potassium: 4.2 mEq/L (ref 3.5–5.3)

## 2012-09-09 LAB — CBC WITH DIFFERENTIAL/PLATELET
Basophils Absolute: 0 10*3/uL (ref 0.0–0.1)
Lymphocytes Relative: 27 % (ref 12–46)
Neutro Abs: 3 10*3/uL (ref 1.7–7.7)
Platelets: 246 10*3/uL (ref 150–400)
RDW: 13.5 % (ref 11.5–15.5)
WBC: 4.7 10*3/uL (ref 4.0–10.5)

## 2012-09-09 MED ORDER — VENLAFAXINE HCL ER 37.5 MG PO CP24: 37.5000 mg | ORAL_CAPSULE | Freq: Every day | ORAL | Status: DC

## 2012-09-09 NOTE — Patient Instructions (Addendum)
I recommend eye visit once a year I recommend dental visit every 6 months Goal is to  Exercise 30 minutes 5 days a week We will call with lab results  Effexor once a day  F/U 4 weeks

## 2012-09-09 NOTE — Progress Notes (Signed)
  Subjective:    Patient ID: Candice Estrada, female    DOB: 1975/04/04, 37 y.o.   MRN: [id number]  HPI Pt here for CPE. Doing well overall, continues to have palpitations on and off, is under a lot of stress with family and work. Did not try the effexor because she was afraid of the side effects, but knows she is stressed and anxious about things. Has GYN physical scheduled for Oct with Mammogram Due for fastng labs today    Review of Systems   GEN- denies fatigue, fever, weight loss,weakness, recent illness HEENT- denies eye drainage, change in vision, nasal discharge, CVS- denies chest pain, +palpitations RESP- denies SOB, cough, wheeze ABD- denies N/V, change in stools, abd pain GU- denies dysuria, hematuria, dribbling, incontinence MSK- denies joint pain, muscle aches, injury Neuro- denies headache, dizziness, syncope, seizure activity      Objective:   Physical Exam GEN- NAD, alert and oriented x3 HEENT- PERRL, EOMI, non injected sclera, pink conjunctiva, MMM, oropharynx clear, TM clear bilat no effusion Neck- Supple, no thryomegaly, no nodule felt CVS- RRR, no murmur RESP-CTAB ABD-NABS,soft,NT,ND GU- Deferred EXT- No edema Pulses- Radial, DP- 2+ Neuro- CNII-XII in tact, no focal deficits Psych- normal affect and mood  EKG- NSR, no ST changes, no arrythmia     Assessment & Plan:

## 2012-09-09 NOTE — Assessment & Plan Note (Signed)
TDAP given , fasting labs Defer GYN exam to Oct

## 2012-09-09 NOTE — Assessment & Plan Note (Addendum)
EKG reassuring, f/u cardiology as previous, work up in the past is normal ? Anxiety/stress related, will try her on Effexor

## 2012-09-09 NOTE — Assessment & Plan Note (Signed)
Pt agrees to try the effexor, I think this does contribute to a lot of her palpitations

## 2012-09-28 ENCOUNTER — Encounter: Payer: Self-pay | Admitting: Family Medicine

## 2012-10-02 ENCOUNTER — Ambulatory Visit: Payer: BC Managed Care – PPO

## 2012-10-07 ENCOUNTER — Ambulatory Visit: Payer: BC Managed Care – PPO | Admitting: Family Medicine

## 2012-10-09 ENCOUNTER — Ambulatory Visit
Admission: RE | Admit: 2012-10-09 | Discharge: 2012-10-09 | Disposition: A | Discharge status: Home / Self Care | Payer: BC Managed Care – PPO | Source: Ambulatory Visit

## 2012-10-09 DIAGNOSIS — Z1231 Encounter for screening mammogram for malignant neoplasm of breast: Secondary | ICD-10-CM

## 2012-10-13 ENCOUNTER — Ambulatory Visit (INDEPENDENT_AMBULATORY_CARE_PROVIDER_SITE_OTHER): Payer: BC Managed Care – PPO | Admitting: Neurology

## 2012-10-13 ENCOUNTER — Other Ambulatory Visit: Payer: Self-pay | Admitting: Family Medicine

## 2012-10-13 ENCOUNTER — Encounter: Payer: Self-pay | Admitting: Neurology

## 2012-10-13 VITALS — BP 109/73 | HR 76 | Temp 98.3°F | Ht 63.75 in | Wt 144.0 lb

## 2012-10-13 DIAGNOSIS — R928 Other abnormal and inconclusive findings on diagnostic imaging of breast: Secondary | ICD-10-CM

## 2012-10-13 DIAGNOSIS — R42 Dizziness and giddiness: Secondary | ICD-10-CM

## 2012-10-13 MED ORDER — TOPIRAMATE 50 MG PO TABS: 50.0000 mg | ORAL_TABLET | Freq: Two times a day (BID) | ORAL | Status: DC

## 2012-10-13 NOTE — Progress Notes (Signed)
[facility] [address] [place]. [place] [address] [phone]       OFFICE FOLLOW-UP NOTE  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 Update 10/14/10  She returns for followup of her last visit on 01/03/12. She continues to have intermittent transient episodes of dizziness which she says can be provoked by moving her head to the right. She feels off balance and dizzy and at times breaks out into sweat and feels tired after the episode. She also has ringing in the ears which appears to be more frequent and constant. She denies any loss of hearing. She was evaluated by ENT physician at [facility] who just examined the ears and stated that he did not find anything wrong. She did not undergo any electronystagmogram or other caloric testing. She was seen by vestibular rehabilitation and prescribed some exercises that she has been doing and finds them to be useful. She continues to have these episodes once or twice a month. In about 6-70% of his episodes she gets headache which she describes as moderate in severity generalized complained by a nausea and light and sound sensitivity. She takes ibuprofen which seems to help. She does have a prior history of migraine headaches in the past. She has no other focal neurological complaints. She had transcranial Doppler bubble study from 01/03/12 which was negative without evidence of any significant right-to-left intracardiac shunt. Transcranial  Doppler emboli monitoring study showed no spontaneous emboli. She has had 2 negative MRI scans of the brain in 2012 as well as October 2013 without any evidence of demyelinating or neurovascular disease and she appears quite frustrated with these recurrent episodes of dizziness for which there is no apparent explanation.  ROS:   14 system review of systems is positive for dizziness, headache, ataxia., Ringing in the ears  PMH:  Past Medical History  Diagnosis Date  . Ovarian cyst   . Urinary incontinence   . Migraine headache   . Anxiety   . ASD (atrial septal defect), ostium secundum     Status post closure 08/20/10 with Dr. Excell Seltzer; procedure complicated by right groin hematoma and acute blood loss anemia  . History of TIAs   . Vitamin B 12 deficiency   . Stroke   . H. pylori infection 2012  . HSV-1 (herpes simplex virus 1) infection 2013    Positive antibiotidies -no history of outbreak   . Irregular bleeding 08/03/2012    Social History:  History   Social History  . Marital Status: Legally Separated    Spouse Name: N/A    Number of Children: 2  . Years of Education: College   Occupational History  . 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 Smoking status: Never Smoker   . Smokeless tobacco: Never Used  . Alcohol Use: Yes     Comment: very rarely  . Drug Use: No  . Sexual Activity: Yes    Birth Control/ Protection: IUD   Other Topics Concern  . Not on  file   Social History Narrative   Patient lives at home with her family.   Caffeine Use: rarely    Medications:   Current Outpatient Prescriptions on File Prior to Visit  Medication Sig Dispense Refill  . amoxicillin (AMOXIL) 500 MG capsule take 4 capsules by mouth 1 hour prior to appointment  4 capsule  0  . aspirin EC 81 MG tablet Take 81 mg by mouth daily.      . Multiple Vitamin (MULTIVITAMIN WITH MINERALS) TABS Take 1 tablet by mouth daily.      Marland Kitchen venlafaxine XR (EFFEXOR XR) 37.5 MG 24 hr  capsule Take 1 capsule (37.5 mg total) by mouth daily.  30 capsule  1   No current facility-administered medications on file prior to visit.    Allergies:  No Known Allergies  Physical Exam General: well developed, well nourished, seated, in no evident distress Head: head normocephalic and atraumatic. Orohparynx benign Neck: supple with no carotid or supraclavicular bruits Cardiovascular: regular rate and rhythm, no murmurs Musculoskeletal: no deformity Skin:  no rash/petichiae Vascular:  Normal pulses all extremities Filed Vitals:   10/13/12 1420  BP: 109/73  Pulse: 76  Temp:     Neurologic Exam Mental Status: Awake and fully alert. Oriented to place and time. Recent and remote memory intact. Attention span, concentration and fund of knowledge appropriate. Mood and affect appropriate.  Cranial Nerves: Fundoscopic exam reveals sharp disc margins. Pupils equal, briskly reactive to light. No afferent pupillary defect. Extraocular movements full without nystagmus. Visual fields full to confrontation. Hearing intact. Facial sensation intact. Face, tongue, palate moves normally and symmetrically.  Motor: Normal bulk and tone. Normal strength in all tested extremity muscles. Sensory.: intact to tough and pinprick and vibratory.  Coordination: Rapid alternating movements normal in all extremities. Finger-to-nose and heel-to-shin performed accurately bilaterally. Head shaking test is negative. Fukuda stepping test patient moved several steps off the base but does not rotate. Gait and Station: Arises from chair without difficulty. Stance is normal. Gait demonstrates normal stride length and balance . Able to heel, toe and tandem walk without difficulty.  Reflexes: 1+ and symmetric. Toes downgoing.      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    PLAN: Trial of Topamax 50 mg daily into 2 weeks followed by twice daily for headache and dizziness prevention. Check brainstem evoked potential. May need to consider referral for ENT second opinion if above treatment is not effective. Return for followup in 3 months.

## 2012-10-13 NOTE — Patient Instructions (Addendum)
Trial of Topamax 50 mg daily into 2 weeks followed by twice daily for headache and dizziness prevention. Check brainstem evoked potential. May need to consider referral for ENT second opinion if above treatment is not effective. Return for followup in 3 months.

## 2012-10-14 DIAGNOSIS — Z0289 Encounter for other administrative examinations: Secondary | ICD-10-CM

## 2012-10-22 ENCOUNTER — Telehealth: Payer: Self-pay | Admitting: Family Medicine

## 2012-10-22 NOTE — Telephone Encounter (Signed)
Left message with pt to return my call °

## 2012-10-22 NOTE — Telephone Encounter (Signed)
Patient needs to know if she can stop her Effexor, not sleeping , sexual side affects are terrible.

## 2012-10-23 ENCOUNTER — Ambulatory Visit
Admission: RE | Admit: 2012-10-23 | Discharge: 2012-10-23 | Disposition: A | Discharge status: Home / Self Care | Payer: BC Managed Care – PPO | Source: Ambulatory Visit | Attending: Family Medicine | Admitting: Family Medicine

## 2012-10-23 DIAGNOSIS — R928 Other abnormal and inconclusive findings on diagnostic imaging of breast: Secondary | ICD-10-CM

## 2012-10-26 ENCOUNTER — Encounter (INDEPENDENT_AMBULATORY_CARE_PROVIDER_SITE_OTHER): Payer: BC Managed Care – PPO

## 2012-10-26 ENCOUNTER — Telehealth: Payer: Self-pay

## 2012-10-26 DIAGNOSIS — R42 Dizziness and giddiness: Secondary | ICD-10-CM

## 2012-10-26 NOTE — Telephone Encounter (Signed)
Documents have been completed and sent for signature. Forms will be sent out by the end of the week.

## 2012-10-27 NOTE — Telephone Encounter (Signed)
She can safely just stop at her current dose

## 2012-10-27 NOTE — Telephone Encounter (Signed)
Pt aware of message via VM

## 2012-10-27 NOTE — Telephone Encounter (Signed)
Wants to stop taking Effexor she hasnt been able to sleep good and sex drive is horrible

## 2012-10-28 ENCOUNTER — Ambulatory Visit: Payer: BC Managed Care – PPO | Admitting: Family Medicine

## 2012-11-03 ENCOUNTER — Telehealth: Payer: Self-pay

## 2012-11-03 NOTE — Telephone Encounter (Signed)
I called and left a VM for patient that I have faxed forms to [facility] and am sending a copy with fax confirmation to patient. It will go out in the mail tomorrow.

## 2012-11-05 ENCOUNTER — Telehealth: Payer: Self-pay | Admitting: *Deleted

## 2012-11-05 NOTE — Telephone Encounter (Signed)
On Tuesday night, we normally run for exercise I had a horrible dizzy spell and afterwards I could not walk back home. I could not walk straight. My husband was with me and had to carry me back to the house. My left leg felt tingly. I felt nauseated. It was very severe. I've been taking Topamax and since my heads felt very heavy. It doesn't seem to work. Can I possibly get and MRI before the end of the year. Please call and advise.

## 2012-11-09 ENCOUNTER — Other Ambulatory Visit: Payer: Self-pay | Admitting: Neurology

## 2012-11-09 DIAGNOSIS — R42 Dizziness and giddiness: Secondary | ICD-10-CM

## 2012-11-09 NOTE — Telephone Encounter (Signed)
Check MRI brain and IAcs w/wo and TCD bubble study for adequacy of pfo closure

## 2012-11-10 NOTE — Telephone Encounter (Signed)
Called patient and left VM message of test ordered by Dr Pearlean Brownie

## 2012-11-11 ENCOUNTER — Encounter: Payer: Self-pay | Admitting: Cardiovascular Disease

## 2012-11-11 ENCOUNTER — Ambulatory Visit: Payer: BC Managed Care – PPO

## 2012-11-11 ENCOUNTER — Ambulatory Visit (INDEPENDENT_AMBULATORY_CARE_PROVIDER_SITE_OTHER): Payer: BC Managed Care – PPO | Admitting: Cardiovascular Disease

## 2012-11-11 VITALS — BP 90/72 | HR 72 | Ht 63.75 in | Wt 144.0 lb

## 2012-11-11 DIAGNOSIS — R55 Syncope and collapse: Secondary | ICD-10-CM

## 2012-11-11 DIAGNOSIS — Q2111 Secundum atrial septal defect: Secondary | ICD-10-CM

## 2012-11-11 DIAGNOSIS — Q211 Atrial septal defect: Secondary | ICD-10-CM

## 2012-11-11 MED ORDER — MIDODRINE HCL 10 MG PO TABS: 10.0000 mg | ORAL_TABLET | Freq: Three times a day (TID) | ORAL | Status: DC

## 2012-11-11 NOTE — Patient Instructions (Signed)
Your physician has recommended you make the following change in your medication: START Midodrine 10mg  take one by mouth three times a day  Your physician recommends that you schedule a follow-up appointment in: 3 MONTHS with Dr Excell Seltzer

## 2012-11-11 NOTE — Progress Notes (Signed)
HPI:  37 year old woman presenting for followup evaluation. She underwent transcatheter ASD closure in 2012 after presenting with a TIA in the setting of a small ostium secundum ASD.  She has continued to have episodes of near syncope on a frequent basis. These episodes are unpredictable and do not occur with postural change. She exercises regularly and denies exertional symptoms. She has had presyncopal spells at rest and during physical exertion or after exertion. She has not had frank syncope, nor has she had any injury. She has fatigue and even admits to gait unsteadiness after these episodes occur. She has undergone extensive evaluation including outpatient telemetry monitoring demonstrating no significant arrhythmia, echocardiography done last year with agitated saline showed no residual right-to-left shunt. She has also undergone neurologic evaluation and is scheduled for a brain MRI this afternoon.  Outpatient Encounter Prescriptions as of 11/11/2012  Medication Sig Dispense Refill  . amoxicillin (AMOXIL) 500 MG capsule take 4 capsules by mouth 1 hour prior to appointment  4 capsule  0  . aspirin EC 81 MG tablet Take 81 mg by mouth daily.      . fludrocortisone (FLORINEF) 0.1 MG tablet Take 0.1 mg by mouth 2 (two) times daily.      . Multiple Vitamin (MULTIVITAMIN WITH MINERALS) TABS Take 1 tablet by mouth daily.      Marland Kitchen topiramate (TOPAMAX) 50 MG tablet Take 1 tablet (50 mg total) by mouth 2 (two) times daily.  60 tablet  3  . [DISCONTINUED] fludrocortisone (FLORINEF) 0.1mg /mL SUSP Take 0.1 mg by mouth 2 (two) times daily.      . midodrine (PROAMATINE) 10 MG tablet Take 1 tablet (10 mg total) by mouth 3 (three) times daily.  90 tablet  6  . [DISCONTINUED] venlafaxine XR (EFFEXOR XR) 37.5 MG 24 hr capsule Take 1 capsule (37.5 mg total) by mouth daily.  30 capsule  1   No facility-administered encounter medications on file as of 11/11/2012.    No Known Allergies  Past Medical History    Diagnosis Date  . Ovarian cyst   . Urinary incontinence   . Migraine headache   . Anxiety   . ASD (atrial septal defect), ostium secundum     Status post closure 08/20/10 with Dr. Excell Seltzer; procedure complicated by right groin hematoma and acute blood loss anemia  . History of TIAs   . Vitamin B 12 deficiency   . Stroke   . H. pylori infection 2012  . HSV-1 (herpes simplex virus 1) infection 2013    Positive antibiotidies -no history of outbreak   . Irregular bleeding 08/03/2012   ROS: Negative except as per HPI  BP 90/72  Pulse 72  Ht 5' 3.75" (1.619 m)  Wt 144 lb (65.318 kg)  BMI 24.92 kg/m2  PHYSICAL EXAM: Pt is alert and oriented, NAD HEENT: normal Neck: JVP - normal, carotids 2+= without bruits Lungs: CTA bilaterally CV: RRR without murmur or gallop Abd: soft, NT, Positive BS, no hepatomegaly Ext: no C/C/E, distal pulses intact and equal Skin: warm/dry no rash  2D Echo 10/30/2011: Left ventricle: The cavity size was normal. Wall thickness was normal. Systolic function was normal. The estimated ejection fraction was in the range of 55% to 60%. Wall motion was normal; there were no regional wall motion abnormalities. The transmitral flow pattern was normal. The deceleration time of the early transmitral flow velocity was normal. The pulmonary vein flow pattern was normal. The tissue Doppler parameters were normal. Left ventricular diastolic function  parameters were normal.  ------------------------------------------------------------ Aortic valve: Structurally normal valve. Trileaflet. Cusp separation was normal. Doppler: Transvalvular velocity was within the normal range. There was no stenosis. No regurgitation.  ------------------------------------------------------------ Aorta: Aortic root: The aortic root was normal in size. Ascending aorta: The ascending aorta was normal in size.  ------------------------------------------------------------ Mitral valve:  Structurally normal valve. Leaflet separation was normal. Doppler: Transvalvular velocity was within the normal range. There was no evidence for stenosis. No regurgitation. Peak gradient: 5mm Hg (D).  ------------------------------------------------------------ Left atrium: The atrium was normal in size.  ------------------------------------------------------------ Atrial septum: Amplatz closure device appears normal. No right to left shunting with bubble study.  ------------------------------------------------------------ Right ventricle: The cavity size was normal. Wall thickness was normal. Systolic function was normal.  ------------------------------------------------------------ Pulmonic valve: Structurally normal valve. Cusp separation was normal. Doppler: Transvalvular velocity was within the normal range. No regurgitation.  ------------------------------------------------------------ Tricuspid valve: Structurally normal valve. Leaflet separation was normal. Doppler: Transvalvular velocity was within the normal range. No regurgitation.  ------------------------------------------------------------ Right atrium: The atrium was normal in size.  ------------------------------------------------------------ Pericardium: There was no pericardial effusion.  ASSESSMENT AND PLAN: #1. Atrial septal defect status post transcatheter closure. Normal device position and no evidence of residual right-to-left shunt on echocardiogram last year.  #2. 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 She hasn't improved with florinef. Will add midodrine 10 mg TID to see if this helps.   I will see her back in 3 months for follow-up. She continues with neurology evaluation. If symptoms don't improve by the time she returns, would consider referral to a tertiary center.  Tonny Bollman 11/11/2012 1:39 PM

## 2012-11-18 ENCOUNTER — Telehealth: Payer: Self-pay | Admitting: Cardiovascular Disease

## 2012-11-18 NOTE — Telephone Encounter (Signed)
New Problem      Pt says Midodrine is working great, how ever pt is having constant chills, is there something she can take to help with the side effects.    Please give pt a call.

## 2012-11-18 NOTE — Telephone Encounter (Signed)
Pt called  Because her BP was is running low 98/47 heart rate is 42 beats/minute. Pt feels fine denies any symptoms. Pt's BP now is 102/51 pulse 43 beats/minute. Pt is aware that one of the potential side effect of Medarine is Bradycardia.

## 2012-11-18 NOTE — Telephone Encounter (Signed)
Patient states she is experiencing "chills/chill-like body response - goosebumps and tingling sensation to scalp", as a side effect of the new medication, Midodrine.  She states the medication is working well but she wants to see if something can be done about the above side effect. She states that a neurologist suggested she add OTC Vit C to her daily medications, but patient wanted to check with Dr. Excell Seltzer first.  She denies any other side effects or problems.  Dr. Excell Seltzer states that it is fine for her to try the OTC Vit C.    Called patient back to let her know of Dr. Earmon Phoenix response.  She verbalized understanding and stated she will let Dr. Excell Seltzer know how the OTC Vit C works for her going forward.

## 2012-11-18 NOTE — Telephone Encounter (Signed)
New Problem     Pt's BP 98/47   42 puls  10 min  Ago.   Pt thinks her this is to lowe, give pt a call back.

## 2012-11-19 ENCOUNTER — Other Ambulatory Visit: Payer: Self-pay

## 2012-11-19 NOTE — Telephone Encounter (Signed)
Patient is still having problems today and would like for you to give her a call back. Please call

## 2012-11-19 NOTE — Telephone Encounter (Signed)
Dr. Elease Hashimoto DOD aware of pt's signs and symptoms and recommends for pt's low BP pt to drink  2 cans of [place] juice and lots of water . If pt feels very bad, She is to come to the office to be seen now, because of  her low heart rate. Pt needs to be seen by some one soon. If  pt  Does not feel that bad, she can wait to see Dr. Excell Seltzer tomorrow. I let pt a detail message to call back. Pt return the call, she is aware of DOD's recommendations. Pt states she will monitor her heart rate and BP today, because she can't come in to be seen today. Pt would like to be put on Dr. Excell Seltzer tomorrow morning and overbook with first pt. I spoke with Marlowe Kays in scheduling;  She  state that Dr. Excell Seltzer has a full schedule in the AM here, and has a PM clinic in the hospital.  I called pt and left a detail message and offer an appointment with Norma Fredrickson NP tomorrow at 1:30 PM instead. Pt is  to call back .

## 2012-11-19 NOTE — Telephone Encounter (Signed)
Pt returnd her call. And agreed to come to see Norma Fredrickson NP tomorrow 11/20/12 at 1:30 PM. Appointment was made.   Pt aware.

## 2012-11-19 NOTE — Telephone Encounter (Signed)
Pt called back again today pt took her BP 45 minutes ago which was 88/43 heart rate 49 beats/minute she feels some dizziness and feels tired today. Pt has taken her Midodrine 10 mg as scheduled today.

## 2012-11-20 ENCOUNTER — Ambulatory Visit (INDEPENDENT_AMBULATORY_CARE_PROVIDER_SITE_OTHER): Payer: BC Managed Care – PPO | Admitting: Nurse Practitioner

## 2012-11-20 ENCOUNTER — Encounter: Payer: Self-pay | Admitting: Nurse Practitioner

## 2012-11-20 VITALS — BP 100/70 | HR 46 | Ht 63.0 in | Wt 149.4 lb

## 2012-11-20 DIAGNOSIS — R55 Syncope and collapse: Secondary | ICD-10-CM

## 2012-11-20 DIAGNOSIS — Q211 Atrial septal defect: Secondary | ICD-10-CM

## 2012-11-20 MED ORDER — MIDODRINE HCL 10 MG PO TABS: 5.0000 mg | ORAL_TABLET | Freq: Three times a day (TID) | ORAL | Status: DC

## 2012-11-20 MED ORDER — MIDODRINE HCL 5 MG PO TABS: 5.0000 mg | ORAL_TABLET | Freq: Three times a day (TID) | ORAL | Status: DC

## 2012-11-20 NOTE — Progress Notes (Signed)
Candice Estrada Date of Birth: May 10, 1975 Medical Record #[id number]  History of Present Illness: [name] is seen back today for a work in visit. Seen for Dr. Excell Seltzer. She underwent transcatheter ASD closure in 2012 after presenting with a TIA in the setting of a small ostium secundum (ASD).   Has had persistent issues of near syncope/hypotension. Has had extensive workup which included outpatien telemetry monitoring which was negative, normal echo with no residual right -to-left shunt, neuro evaluation, EP evaluation, etc - with no etiology. Apparently tried on numerous medicines.   Called yesterday several times - noted at first that her heart rate was 42 but felt fine. Called back later in the day with BP down to 88/43 and HR of 49 and feeling tired and dizzy. Thus added to my schedule.   Comes in today. Here alone.  Was started on Midodrine last Wednesday. This has helped her feel better and her BP has improved. HR has dropped down. She is having "crazy chills". Has goose bumps all over and tingling feeling. She is not passing out. It is a sinus mechanism. Admits that it makes her scared but she really does not want to stop taking.    Current Outpatient Prescriptions  Medication Sig Dispense Refill  . amoxicillin (AMOXIL) 500 MG capsule take 4 capsules by mouth 1 hour prior to appointment  4 capsule  0  . aspirin EC 81 MG tablet Take 81 mg by mouth daily.      . fludrocortisone (FLORINEF) 0.1 MG tablet Take 0.1 mg by mouth 2 (two) times daily.      . Multiple Vitamin (MULTIVITAMIN WITH MINERALS) TABS Take 1 tablet by mouth daily.      . midodrine (PROAMATINE) 5 MG tablet Take 1 tablet (5 mg total) by mouth 3 (three) times daily with meals.  90 tablet  11   No current facility-administered medications for this visit.    No Known Allergies  Past Medical History  Diagnosis Date  . Ovarian cyst   . Urinary incontinence   . Migraine headache   . Anxiety   . ASD (atrial septal  defect), ostium secundum     Status post closure 08/20/10 with Dr. Excell Seltzer; procedure complicated by right groin hematoma and acute blood loss anemia  . History of TIAs   . Vitamin B 12 deficiency   . Stroke   . H. pylori infection 2012  . HSV-1 (herpes simplex virus 1) infection 2013    Positive antibiotidies -no history of outbreak   . Irregular bleeding 08/03/2012    Past Surgical History  Procedure Laterality Date  . Thyroid cyst rem    . Tonsillectomy    . Foot neuroma surgery  2009    left  . Thyroid cyst excision    . Asd repair  08/20/2010  . Cardiac surgery      History  Smoking status  . Never Smoker   Smokeless tobacco  . Never Used    History  Alcohol Use  . Yes    Comment: very rarely    Family History  Problem Relation Age of Onset  . Hypertension Father 38  . Stroke Father   . Heart attack Father   . Brain cancer Father   . Heart disease Father   . Diabetes Father   . Liver disease Mother 77    dead  . Hypertension Brother 35  . Breast cancer Other     PATERNAL AUNT    Review  of Systems: The review of systems is per the HPI.  All other systems were reviewed and are negative.  Physical Exam: BP 100/70  Pulse 46  Ht 5\' 3"  (1.6 m)  Wt 149 lb 6.4 oz (67.767 kg)  BMI 26.47 kg/m2 Patient is very pleasant and in no acute distress. Skin is warm and dry. Color is normal.  HEENT is unremarkable. Normocephalic/atraumatic. PERRL. Sclera are nonicteric. Neck is supple. No masses. No JVD. Lungs are clear. Cardiac exam shows a regular rate and rhythm. Abdomen is soft. Extremities are without edema. Gait and ROM are intact. No gross neurologic deficits noted.  LABORATORY DATA: EKG today shows sinus bradycardia - rate of 46 - tracing reviewed with Dr. Excell Seltzer.   Lab Results  Component Value Date   WBC 4.7 09/09/2012   HGB 14.5 09/09/2012   HCT 42.5 09/09/2012   PLT 246 09/09/2012   GLUCOSE 83 09/09/2012   CHOL 142 09/09/2012   TRIG 76 09/09/2012   HDL 50  09/09/2012   LDLCALC 77 09/09/2012   ALT 16 09/09/2012   AST 18 09/09/2012   NA 139 09/09/2012   K 4.2 09/09/2012   CL 107 09/09/2012   CREATININE 0.88 09/09/2012   BUN 8 09/09/2012   CO2 24 09/09/2012   TSH 1.231 09/09/2012   INR 0.9 08/15/2010   HGBA1C  Value: 5.2 (NOTE)                                                                       According to the ADA Clinical Practice Recommendations for 2011, when HbA1c is used as a screening test:   >=6.5%   Diagnostic of Diabetes Mellitus           (if abnormal result  is confirmed)  5.7-6.4%   Increased risk of developing Diabetes Mellitus  References:Diagnosis and Classification of Diabetes Mellitus,Diabetes Care,2011,34(Suppl 1):S62-S69 and Standards of Medical Care in         Diabetes - 2011,Diabetes Care,2011,34  (Suppl 1):S11-S61. 03/13/2010   Echo Study Conclusions from October 2013  Left ventricle: The cavity size was normal. Wall thickness was normal. Systolic function was normal. The estimated ejection fraction was in the range of 55% to 60%. Wall motion was normal; there were no regional wall motion abnormalities. Left ventricular diastolic function parameters were normal.  Assessment / Plan: 1. Orthostasis - BP has improved. Her symptoms have improved with the midodrine but she is having side effects - will try her on 5 mg TID - check apical pulses. If she fails on this therapy, Dr. Excell Seltzer has suggested referral to a tertiary system.   2. ASD closure  See her back as planned.   Patient is agreeable to this plan and will call if any problems develop in the interim.   Rosalio Macadamia, RN, ANP-C [facility] [address] [place], [place]  [address]

## 2012-11-20 NOTE — Patient Instructions (Signed)
Cut the midodrine back to 5 mg three times a day  See Dr. Excell Seltzer back as planned  Keep a check on your heart rate/BP  Keep liberalizing your salt  Call the [facility] office at [phone] if you have any questions, problems or concerns.

## 2012-11-23 ENCOUNTER — Ambulatory Visit: Payer: BC Managed Care – PPO | Admitting: Family Medicine

## 2012-11-23 ENCOUNTER — Other Ambulatory Visit: Payer: BC Managed Care – PPO | Admitting: Adult Health

## 2012-12-16 ENCOUNTER — Ambulatory Visit (HOSPITAL_COMMUNITY)
Admission: RE | Admit: 2012-12-16 | Discharge: 2012-12-16 | Disposition: A | Discharge status: Home / Self Care | Payer: BC Managed Care – PPO | Source: Ambulatory Visit | Attending: Adult Health | Admitting: Adult Health

## 2012-12-16 ENCOUNTER — Ambulatory Visit (INDEPENDENT_AMBULATORY_CARE_PROVIDER_SITE_OTHER): Payer: BC Managed Care – PPO | Admitting: Adult Health

## 2012-12-16 ENCOUNTER — Encounter: Payer: Self-pay | Admitting: Adult Health

## 2012-12-16 ENCOUNTER — Other Ambulatory Visit: Payer: BC Managed Care – PPO | Admitting: Adult Health

## 2012-12-16 ENCOUNTER — Telehealth: Payer: Self-pay | Admitting: Adult Health

## 2012-12-16 VITALS — BP 90/70 | HR 72 | Ht 62.5 in | Wt 142.5 lb

## 2012-12-16 DIAGNOSIS — Z975 Presence of (intrauterine) contraceptive device: Secondary | ICD-10-CM

## 2012-12-16 DIAGNOSIS — E049 Nontoxic goiter, unspecified: Secondary | ICD-10-CM | POA: Insufficient documentation

## 2012-12-16 DIAGNOSIS — Z01419 Encounter for gynecological examination (general) (routine) without abnormal findings: Secondary | ICD-10-CM

## 2012-12-16 HISTORY — DX: Nontoxic goiter, unspecified: E04.9

## 2012-12-16 HISTORY — DX: Presence of (intrauterine) contraceptive device: Z97.5

## 2012-12-16 NOTE — Patient Instructions (Addendum)
Physical in 1 year Mammogram October 2015 Thyroid US today Labs next year

## 2012-12-16 NOTE — Progress Notes (Signed)
Patient ID: Candice Estrada, female   DOB: 1975/10/31, 37 y.o.   MRN: [id number] History of Present Illness: Candice Estrada is a 37 year old white female married in for a physical.She had a normal pap with negative HPV 11/18/11.Got flu shot at work.   Current Medications, Allergies, Past Medical History, Past Surgical History, Family History and Social History were reviewed in [name] record.   Past Medical History  Diagnosis Date  . Ovarian cyst   . Urinary incontinence   . Migraine headache   . Anxiety   . ASD (atrial septal defect), ostium secundum     Status post closure 08/20/10 with Dr. Excell Seltzer; procedure complicated by right groin hematoma and acute blood loss anemia  . History of TIAs   . Vitamin B 12 deficiency   . H. pylori infection 2012  . HSV-1 (herpes simplex virus 1) infection 2013    Positive antibiotidies -no history of outbreak   . Irregular bleeding 08/03/2012  . Stroke   . IUD (intrauterine device) in place 12/16/2012  . Thyroid enlarged 12/16/2012    History of cyst will get [place]   Past Surgical History  Procedure Laterality Date  . Thyroid cyst rem    . Tonsillectomy    . Foot neuroma surgery  2009    left  . Thyroid cyst excision    . Asd repair  08/20/2010  . Cardiac surgery    Current outpatient prescriptions:aspirin EC 81 MG tablet, Take 81 mg by mouth daily., Disp: , Rfl: ;  fludrocortisone (FLORINEF) 0.1 MG tablet, Take 0.1 mg by mouth 2 (two) times daily., Disp: , Rfl: ;  midodrine (PROAMATINE) 5 MG tablet, Take 1 tablet (5 mg total) by mouth 3 (three) times daily with meals., Disp: 90 tablet, Rfl: 11;  Multiple Vitamin (MULTIVITAMIN WITH MINERALS) TABS, Take 1 tablet by mouth daily., Disp: , Rfl:   Review of Systems: Patient denies any headaches, blurred vision, shortness of breath, chest pain, abdominal pain, problems with bowel movements, urination, or intercourse. She has dizziness at times and has history of stroke from ASD, she sees  [name]. No joint pain or mood changes.She would still like one more child.    Physical Exam:BP 90/70  Pulse 72  Ht 5' 2.5" (1.588 m)  Wt 142 lb 8 oz (64.638 kg)  BMI 25.63 kg/m2  LMP 11/17/2012 General:  Well developed, well nourished, no acute distress Skin:  Warm and dry Neck:  Midline trachea,  Thyroid ULNS has history of cyst that was removed Lungs; Clear to auscultation bilaterally Breast:  No dominant palpable mass, retraction, or nipple discharge, has regular irregularity  Cardiovascular: Regular rate and rhythm Abdomen:  Soft, non tender, no hepatosplenomegaly Pelvic:  External genitalia is normal in appearance.  The vagina is normal in appearance. The cervix is bulbous, with +IUD strings.  Uterus is felt to be normal size, shape, and contour.  No  adnexal masses or tenderness noted. Extremities:  No swelling or varicosities noted Psych:  No mood changes, alert and cooperative seems happy   Impression: Yearly gyn exam no pap Thyroid ULNS IUD in place    Plan: Thyroid US 12/3 at 2 pm Physical in 1 year Mammogram 10/15 Labs next year

## 2012-12-16 NOTE — Telephone Encounter (Signed)
Left message to call in am  

## 2012-12-18 ENCOUNTER — Other Ambulatory Visit: Payer: Self-pay | Admitting: Adult Health

## 2012-12-18 LAB — THYROID PANEL WITH TSH: T4, Total: 8.2 ug/dL (ref 5.0–12.5)

## 2012-12-21 ENCOUNTER — Telehealth: Payer: Self-pay | Admitting: Adult Health

## 2012-12-21 NOTE — Telephone Encounter (Signed)
Pt aware of labs  

## 2012-12-30 ENCOUNTER — Ambulatory Visit: Payer: BC Managed Care – PPO | Admitting: Neurology

## 2013-01-11 ENCOUNTER — Ambulatory Visit: Payer: BC Managed Care – PPO | Admitting: Neurology

## 2013-01-11 ENCOUNTER — Telehealth: Payer: Self-pay | Admitting: Family Medicine

## 2013-01-11 MED ORDER — OSELTAMIVIR PHOSPHATE 75 MG PO CAPS: 75.0000 mg | ORAL_CAPSULE | Freq: Two times a day (BID) | ORAL | Status: DC

## 2013-01-11 NOTE — Telephone Encounter (Signed)
NTBS  Try to call, left mess to call back

## 2013-01-11 NOTE — Telephone Encounter (Signed)
Pts dtr was dx with flu at [facility] 12/23 and her symptoms just started yesterday. She is wanting Tamiflu called in if possible????? 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

## 2013-01-11 NOTE — Telephone Encounter (Signed)
Okay to send Tamiflu 75mg  twice a day for 5 days She can not be at work with flu like symptoms and fever

## 2013-01-11 NOTE — Telephone Encounter (Signed)
Pt is running 100-102 fever she is coughing really bad, she has been up since 4 am she started getting sick yesterday morning She is wanting to know if there can be Tamaflu be called in for her Pharmacy is [facility] back number [phone] (please call pt and let her know if this can be called in or if she needs to make an apt)

## 2013-01-11 NOTE — Telephone Encounter (Signed)
.  Patient aware per vm and rx sent to pharm

## 2013-01-27 ENCOUNTER — Encounter: Payer: Self-pay | Admitting: Physician Assistant

## 2013-01-27 ENCOUNTER — Ambulatory Visit (INDEPENDENT_AMBULATORY_CARE_PROVIDER_SITE_OTHER): Payer: BC Managed Care – PPO | Admitting: Physician Assistant

## 2013-01-27 ENCOUNTER — Ambulatory Visit (HOSPITAL_COMMUNITY)
Admission: RE | Admit: 2013-01-27 | Discharge: 2013-01-27 | Disposition: A | Discharge status: Home / Self Care | Payer: BC Managed Care – PPO | Source: Ambulatory Visit | Attending: Physician Assistant | Admitting: Physician Assistant

## 2013-01-27 VITALS — BP 112/68 | HR 68 | Temp 97.5°F | Resp 18 | Ht 64.25 in | Wt 144.0 lb

## 2013-01-27 DIAGNOSIS — M533 Sacrococcygeal disorders, not elsewhere classified: Secondary | ICD-10-CM | POA: Insufficient documentation

## 2013-01-27 NOTE — Progress Notes (Signed)
    Patient ID: Candice Estrada MRN: [id number], DOB: 20-Aug-1975, 38 y.o. Date of Encounter: 01/27/2013, 1:41 PM    Chief Complaint:  Chief Complaint  Patient presents with  . c/o lower back pain    worse when sitting, right at tail bone     HPI: 38 y.o. 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  Says that she is having this pain at the tip of her tail bone--says it feels as if it is bruised there but that she has looked at the area in the mirror  and there is no bruise. Says she has had no fall or any other known injury that she can recall. Says she does go to the gym on a routine basis but does not recall doing anything that would injure the area. Says that her kitchen chairs are hard and when she sits on a chair, the tip of her tail bone feels severely painful. She says that she just wants to go get an x-ray to make sure that she doesn't have cancer or something really bad in there causing this b/c she doesn't know what could be causing the pain. She does not need pain pills or anything else just wants to make sure there is nothing wrong.     Home Meds: See attached medication section for any medications that were entered at today's visit. The computer does not put those onto this list.The following list is a list of meds entered prior to today's visit.   Current Outpatient Prescriptions on File Prior to Visit  Medication Sig Dispense Refill  . aspirin EC 81 MG tablet Take 81 mg by mouth daily.      . fludrocortisone (FLORINEF) 0.1 MG tablet Take 0.1 mg by mouth 2 (two) times daily.      . midodrine (PROAMATINE) 5 MG tablet Take 1 tablet (5 mg total) by mouth 3 (three) times daily with meals.  90 tablet  11  . Multiple Vitamin (MULTIVITAMIN WITH MINERALS) TABS Take 1 tablet by mouth daily.       No current facility-administered medications on file prior to visit.    Allergies: No Known Allergies    Review of Systems: See HPI for pertinent ROS. All  other ROS negative.    Physical Exam: Blood pressure 112/68, pulse 68, temperature 97.5 F (36.4 C), temperature source Oral, resp. rate 18, height 5' 4.25" (1.632 m), weight 144 lb (65.318 kg), last menstrual period 01/20/2013., Body mass index is 24.52 kg/(m^2). General:  WNWD WF. Appears in no acute distress. Neck: Supple. No thyromegaly. No lymphadenopathy. Lungs: Clear bilaterally to auscultation without wheezes, rales, or rhonchi. Breathing is unlabored. Heart: Regular rhythm. No murmurs, rubs, or gallops. Extremities/Skin: Back: Inspection of her low back and buttock area is normal. There is no ecchymosis or erythema or swelling. There is tenderness with palpation at the tip of her coccyx. Neuro: Alert and oriented X 3. Moves all extremities spontaneously. Gait is normal. CNII-XII grossly in tact. Psych:  Responds to questions appropriately with a normal affect.     ASSESSMENT AND PLAN:  38 y.o. year old female with  1. Coccydynia - DG Sacrum/Coccyx; Future Obtain x-ray. Discussed using a "donut" pillow to avoid pressure on the coccyx.   [address] [place], [place], [facility] 01/27/2013 1:41 PM

## 2013-02-19 ENCOUNTER — Encounter: Payer: Self-pay | Admitting: Family Medicine

## 2013-02-23 ENCOUNTER — Encounter: Payer: Self-pay | Admitting: Family Medicine

## 2013-02-23 ENCOUNTER — Ambulatory Visit (INDEPENDENT_AMBULATORY_CARE_PROVIDER_SITE_OTHER): Payer: BC Managed Care – PPO | Admitting: Family Medicine

## 2013-02-23 VITALS — BP 100/70 | HR 82 | Temp 97.8°F | Resp 18 | Ht 62.0 in | Wt 143.0 lb

## 2013-02-23 DIAGNOSIS — G478 Other sleep disorders: Secondary | ICD-10-CM | POA: Insufficient documentation

## 2013-02-23 DIAGNOSIS — F411 Generalized anxiety disorder: Secondary | ICD-10-CM

## 2013-02-23 DIAGNOSIS — F514 Sleep terrors [night terrors]: Secondary | ICD-10-CM | POA: Insufficient documentation

## 2013-02-23 MED ORDER — BUSPIRONE HCL 7.5 MG PO TABS: 7.5000 mg | ORAL_TABLET | Freq: Two times a day (BID) | ORAL | Status: DC

## 2013-02-23 NOTE — Assessment & Plan Note (Signed)
I think this is due to her stress and anxiety in general, no signs or history of abuse She does not have difficulty falling asleep, therefore hold on sleep aides Consider prazosin but she already as hypotension episodes and is on flourinef

## 2013-02-23 NOTE — Assessment & Plan Note (Signed)
She is worried about SE with meds, will try her on buspar BID  F/u 4 weeks Will try to avoid habit forming meds at this time

## 2013-02-23 NOTE — Progress Notes (Signed)
   Subjective:    Patient ID: Candice Estrada, female    DOB: 05-Nov-1975, 38 y.o.   MRN: [id number]  Patient presents for trouble sleeping  For the past few weeks she has had night terrors, she wakes up thinking there are " little men "at the bottom of her bed trying to attack her, she also sees "little toy indians" with guns, this typically happens as soon as she falls asleep. She wakes up in a fit screaming for her fiancee. She has a very stressful job and has a lot of anxiety. As a child she also had night terrors. Denies any abuse. Feels safe at home. In the past was on lexapro and effexor for GAD but never stayed on the meds due to weight gain and sexual side effects. Denies feeling depressed.    Review Of Systems:  GEN- denies fatigue, fever, weight loss,weakness, recent illness HEENT- denies eye drainage, change in vision, nasal discharge, CVS- denies chest pain, palpitations RESP- denies SOB, cough, wheeze Neuro- denies headache, dizziness, syncope, seizure activity       Objective:    BP 100/70  Pulse 82  Temp(Src) 97.8 F (36.6 C) (Oral)  Resp 18  Ht 5' 2"  (1.575 m)  Wt 143 lb (64.864 kg)  BMI 26.15 kg/m2  LMP 01/20/2013 GEN- NAD, alert and oriented x3 PSYCH- Normal affect, +anxious appearing, not depressed, good eye contact, normal speech, no SI        Assessment & Plan:      Problem List Items Addressed This Visit   None      Note: This dictation was prepared with Dragon dictation along with smaller phrase technology. Any transcriptional errors that result from this process are unintentional.

## 2013-02-23 NOTE — Patient Instructions (Signed)
Start the buspar twice a day  Return in 4 weeks

## 2013-03-05 ENCOUNTER — Ambulatory Visit: Payer: BC Managed Care – PPO | Admitting: Cardiovascular Disease

## 2013-03-24 ENCOUNTER — Encounter: Payer: Self-pay | Admitting: Family Medicine

## 2013-03-24 ENCOUNTER — Ambulatory Visit (INDEPENDENT_AMBULATORY_CARE_PROVIDER_SITE_OTHER): Payer: BC Managed Care – PPO | Admitting: Family Medicine

## 2013-03-24 VITALS — BP 100/70 | HR 68 | Temp 98.6°F | Resp 14 | Ht 63.0 in | Wt 140.0 lb

## 2013-03-24 DIAGNOSIS — R42 Dizziness and giddiness: Secondary | ICD-10-CM

## 2013-03-24 DIAGNOSIS — F514 Sleep terrors [night terrors]: Secondary | ICD-10-CM

## 2013-03-24 DIAGNOSIS — G478 Other sleep disorders: Secondary | ICD-10-CM

## 2013-03-24 DIAGNOSIS — F411 Generalized anxiety disorder: Secondary | ICD-10-CM

## 2013-03-24 DIAGNOSIS — G459 Transient cerebral ischemic attack, unspecified: Secondary | ICD-10-CM

## 2013-03-24 LAB — CBC WITH DIFFERENTIAL/PLATELET
Basophils Absolute: 0 10*3/uL (ref 0.0–0.1)
Basophils Relative: 0 % (ref 0–1)
Eosinophils Absolute: 0.1 10*3/uL (ref 0.0–0.7)
Eosinophils Relative: 2 % (ref 0–5)
HEMATOCRIT: 42.8 % (ref 36.0–46.0)
HEMOGLOBIN: 14.2 g/dL (ref 12.0–15.0)
LYMPHS ABS: 1.7 10*3/uL (ref 0.7–4.0)
Lymphocytes Relative: 32 % (ref 12–46)
MCH: 29.7 pg (ref 26.0–34.0)
MCHC: 33.2 g/dL (ref 30.0–36.0)
MCV: 89.5 fL (ref 78.0–100.0)
MONOS PCT: 7 % (ref 3–12)
Monocytes Absolute: 0.4 10*3/uL (ref 0.1–1.0)
NEUTROS ABS: 3.1 10*3/uL (ref 1.7–7.7)
NEUTROS PCT: 59 % (ref 43–77)
Platelets: 258 10*3/uL (ref 150–400)
RBC: 4.78 MIL/uL (ref 3.87–5.11)
RDW: 14 % (ref 11.5–15.5)
WBC: 5.2 10*3/uL (ref 4.0–10.5)

## 2013-03-24 LAB — COMPREHENSIVE METABOLIC PANEL
ALBUMIN: 4.3 g/dL (ref 3.5–5.2)
ALT: 16 U/L (ref 0–35)
AST: 21 U/L (ref 0–37)
Alkaline Phosphatase: 40 U/L (ref 39–117)
BUN: 10 mg/dL (ref 6–23)
CALCIUM: 9.4 mg/dL (ref 8.4–10.5)
CHLORIDE: 105 meq/L (ref 96–112)
CO2: 27 meq/L (ref 19–32)
Creat: 0.85 mg/dL (ref 0.50–1.10)
Glucose, Bld: 81 mg/dL (ref 70–99)
POTASSIUM: 4.2 meq/L (ref 3.5–5.3)
SODIUM: 139 meq/L (ref 135–145)
TOTAL PROTEIN: 6.8 g/dL (ref 6.0–8.3)
Total Bilirubin: 0.7 mg/dL (ref 0.2–1.2)

## 2013-03-24 MED ORDER — FLUDROCORTISONE ACETATE 0.1 MG PO TABS: 0.1000 mg | ORAL_TABLET | Freq: Two times a day (BID) | ORAL | Status: DC

## 2013-03-24 MED ORDER — BUSPIRONE HCL 7.5 MG PO TABS: 7.5000 mg | ORAL_TABLET | Freq: Two times a day (BID) | ORAL | Status: DC

## 2013-03-24 MED ORDER — MIDODRINE HCL 5 MG PO TABS: 5.0000 mg | ORAL_TABLET | Freq: Three times a day (TID) | ORAL | Status: DC

## 2013-03-24 MED ORDER — IBUPROFEN 800 MG PO TABS: 800.0000 mg | ORAL_TABLET | Freq: Three times a day (TID) | ORAL | Status: DC | PRN

## 2013-03-24 MED ORDER — LORAZEPAM 1 MG PO TABS: 1.0000 mg | ORAL_TABLET | Freq: Two times a day (BID) | ORAL | Status: DC | PRN

## 2013-03-24 NOTE — Assessment & Plan Note (Signed)
Symptoms improve her nightmares have resolved

## 2013-03-24 NOTE — Patient Instructions (Signed)
MRI of brain to be done  Labs to be done Continue buspar F/U 3 months

## 2013-03-24 NOTE — Assessment & Plan Note (Signed)
Symptoms improved with BuSpar will continue at current dose

## 2013-03-24 NOTE — Assessment & Plan Note (Signed)
Her recent episode seem similar to her previous TIAs she's not had an MRI of her brain in about a year and a half she's had a few these episodes recently. I will obtain a repeat MRI of the brain to see if there is any changes. She does have followup with neurology in [place] we may have to send her to either to call [facility] to see what is going on with these episodes. Her EEG in the past has been normal her basic blood work has also been normal including her thyroid studies. She is being followed by cardiology but he in the treatment for her pots and hypotensive symptoms has not changed these episodes. She does have underlying anxiety but there is not a very good correlation

## 2013-03-24 NOTE — Progress Notes (Signed)
Patient ID: Candice Estrada, female   DOB: 1975/08/16, 38 y.o.   MRN: [id number]   Subjective:    Patient ID: Candice Estrada, female    DOB: Dec 09, 1975, 38 y.o.   MRN: [id number]  Patient presents for Follow up med check   patient here to followup medications. She was placed on BuSpar about 4 weeks ago. She states that her anxiety is much improved and her nightmares have resolved. She's concerned about recurrent TIA she had another severe episode last Friday where she became dizzy weak and could not walk. She was helped by her staff members at work. Episode lasted less than 10 minutes she still felt dizzy since then. She's not had any imaging recently and did not get evaluated that day after the attack as these have been similar to her past ones and no one is quite figured out why she is having them. Did not have any chest pain or palpitations during that episode. She did have another one a few weeks ago which was short-lived. Of note she has been evaluated by neurology cardiology as well as ear nose and throat for possible in her ear or Mnire's disease but that has been ruled out     Review Of Systems:  GEN- denies fatigue, fever, weight loss,weakness, recent illness HEENT- denies eye drainage, change in vision, nasal discharge, CVS- denies chest pain, palpitations RESP- denies SOB, cough, wheeze ABD- denies N/V, change in stools, abd pain GU- denies dysuria, hematuria, dribbling, incontinence MSK- denies joint pain, muscle aches, injury Neuro- + headache,+ dizziness, syncope, seizure activity       Objective:    BP 100/70  Pulse 68  Temp(Src) 98.6 F (37 C) (Oral)  Resp 14  Ht 5' 3"  (1.6 m)  Wt 140 lb (63.504 kg)  BMI 24.81 kg/m2 GEN- NAD, alert and oriented x3 HEENT- PERRL, EOMI, non injected sclera, pink conjunctiva, MMM, oropharynx clear CVS- RRR, no murmur RESP-CTAB Neuro- CNII-XII in tact, no focal deficits, strength normal EXT- No edema Pulses- Radial, DP-  2+ Psych- normal affect and mood, not anxious appearing       Assessment & Plan:      Problem List Items Addressed This Visit   TIA (transient ischemic attack)     Her recent episode seem similar to her previous TIAs she's not had an MRI of her brain in about a year and a half she's had a few these episodes recently. I will obtain a repeat MRI of the brain to see if there is any changes. She does have followup with neurology in [place] we may have to send her to either to call [facility] to see what is going on with these episodes. Her EEG in the past has been normal her basic blood work has also been normal including her thyroid studies. She is being followed by cardiology but he in the treatment for her pots and hypotensive symptoms has not changed these episodes. She does have underlying anxiety but there is not a very good correlation    Relevant Medications      midodrine (PROAMATINE) tablet   Night terrors, adult     Symptoms improve her nightmares have resolved    ANXIETY     Symptoms improved with BuSpar will continue at current dose    Relevant Medications      busPIRone (BUSPAR) tablet      LORazepam (ATIVAN) tablet    Other Visit Diagnoses   Dizziness and giddiness    -  Primary    Relevant Medications       fludrocortisone (FLORINEF) 0.1 MG tablet    Other Relevant Orders       CBC with Differential       Comprehensive metabolic panel       Note: This dictation was prepared with Dragon dictation along with smaller phrase technology. Any transcriptional errors that result from this process are unintentional.

## 2013-03-29 ENCOUNTER — Ambulatory Visit (HOSPITAL_COMMUNITY)
Admission: RE | Admit: 2013-03-29 | Discharge: 2013-03-29 | Disposition: A | Discharge status: Home / Self Care | Payer: BC Managed Care – PPO | Source: Ambulatory Visit | Attending: Family Medicine | Admitting: Family Medicine

## 2013-03-29 DIAGNOSIS — R42 Dizziness and giddiness: Secondary | ICD-10-CM | POA: Insufficient documentation

## 2013-03-29 DIAGNOSIS — G459 Transient cerebral ischemic attack, unspecified: Secondary | ICD-10-CM

## 2013-03-29 DIAGNOSIS — R51 Headache: Secondary | ICD-10-CM | POA: Insufficient documentation

## 2013-04-02 ENCOUNTER — Encounter: Payer: Self-pay | Admitting: Nurse Practitioner

## 2013-04-02 ENCOUNTER — Ambulatory Visit (INDEPENDENT_AMBULATORY_CARE_PROVIDER_SITE_OTHER): Payer: BC Managed Care – PPO | Admitting: Nurse Practitioner

## 2013-04-02 VITALS — BP 110/70 | HR 58 | Ht 64.0 in | Wt 142.0 lb

## 2013-04-02 DIAGNOSIS — R002 Palpitations: Secondary | ICD-10-CM

## 2013-04-02 DIAGNOSIS — Q2111 Secundum atrial septal defect: Secondary | ICD-10-CM

## 2013-04-02 DIAGNOSIS — Q211 Atrial septal defect: Secondary | ICD-10-CM

## 2013-04-02 NOTE — Patient Instructions (Signed)
Stay on your current medicines for now  I will talk with Dr. Burt Knack about where he would like to refer you to or if he has further ideas for you  Call the [place] office at [phone] if you have any questions, problems or concerns.

## 2013-04-02 NOTE — Progress Notes (Signed)
Candice Estrada Date of Birth: 10-10-1975 Medical Record #[id number]  History of Present Illness: [name] is seen back today for a 4 month follow up visit. Seen for Dr. Burt Knack. She underwent transcatheter ASD closure in 2012 after presenting with a TIA in the setting of a small ostium secundum (ASD).   Has had persistent issues of near syncope/hypotension. Had extensive workup which included outpatient telemetry monitoring which was negative, normal echo with no residual right -to-left shunt, neuro evaluation, EP evaluation, etc - with no etiology. Apparently tried on numerous medicines. Midodrine added - this seemed to helpher feel better initially and her BP improved.   Comes back today. Here alone. Now on both midodrine and florinef. Having recurrent spells of dizziness and presyncope. Has had insurance issues for her medicine and for 2 weeks was off of them - felt no different - restarted and her "goose bumps" came back. She is still taking both. She describes spells where she feels hot, her heart is racing, the room then spins. Will last for about 2 minutes or so - then she is tired. Will happen regardless of activity, whether sitting or standing. Has had past event monitor - but had no symptoms while wearing. She is frustrated. Wants to know if this is her heart or not. She has had recent MRI. She has been seeing PCP as well for these issues as well as anxiety issues.   Current Outpatient Prescriptions  Medication Sig Dispense Refill  . aspirin EC 81 MG tablet Take 81 mg by mouth daily.      . busPIRone (BUSPAR) 7.5 MG tablet Take 1 tablet (7.5 mg total) by mouth 2 (two) times daily.  60 tablet  3  . fludrocortisone (FLORINEF) 0.1 MG tablet Take 1 tablet (0.1 mg total) by mouth 2 (two) times daily.  60 tablet  3  . ibuprofen (ADVIL,MOTRIN) 800 MG tablet Take 1 tablet (800 mg total) by mouth every 8 (eight) hours as needed.  30 tablet  2  . midodrine (PROAMATINE) 5 MG tablet Take 1  tablet (5 mg total) by mouth 3 (three) times daily with meals.  90 tablet  11  . Multiple Vitamin (MULTIVITAMIN WITH MINERALS) TABS Take 1 tablet by mouth daily.       No current facility-administered medications for this visit.    No Known Allergies  Past Medical History  Diagnosis Date  . Ovarian cyst   . Urinary incontinence   . Migraine headache   . Anxiety   . ASD (atrial septal defect), ostium secundum     Status post closure 08/20/10 with Dr. Burt Knack; procedure complicated by right groin hematoma and acute blood loss anemia  . History of TIAs   . Vitamin B 12 deficiency   . H. pylori infection 2012  . HSV-1 (herpes simplex virus 1) infection 2013    Positive antibiotidies -no history of outbreak   . Irregular bleeding 08/03/2012  . Stroke   . IUD (intrauterine device) in place 12/16/2012  . Thyroid enlarged 12/16/2012    History of cyst will get [place]    Past Surgical History  Procedure Laterality Date  . Thyroid cyst rem    . Tonsillectomy    . Foot neuroma surgery  2009    left  . Thyroid cyst excision    . Asd repair  08/20/2010  . Cardiac surgery      History  Smoking status  . Never Smoker   Smokeless tobacco  . Never  Used    History  Alcohol Use  . Yes    Comment: very rarely    Family History  Problem Relation Age of Onset  . Hypertension Father 16  . Stroke Father   . Heart attack Father   . Brain cancer Father   . Heart disease Father   . Diabetes Father   . Liver disease Mother 65    dead  . Cancer Mother     breast  . Hypertension Brother 61  . Cancer Paternal Grandmother     breast  . Cancer Maternal Grandmother     breast  . Cancer Paternal Aunt     breast     Review of Systems: The review of systems is per the HPI.  All other systems were reviewed and are negative.  Physical Exam: BP 110/70  Pulse 58  Ht 5' 4"  (1.626 m)  Wt 142 lb (64.411 kg)  BMI 24.36 kg/m2  LMP 03/06/2013 Patient is very pleasant and in no acute distress.  Skin is warm and dry. Color is normal.  HEENT is unremarkable. Normocephalic/atraumatic. PERRL. Sclera are nonicteric. Neck is supple. No masses. No JVD. Lungs are clear. Cardiac exam shows a regular rate and rhythm. Abdomen is soft. Extremities are without edema. Gait and ROM are intact. No gross neurologic deficits noted.  LABORATORY DATA: EKG shows sinus bradycardia. Rate is 58  Lab Results  Component Value Date   WBC 5.2 03/24/2013   HGB 14.2 03/24/2013   HCT 42.8 03/24/2013   PLT 258 03/24/2013   GLUCOSE 81 03/24/2013   CHOL 142 09/09/2012   TRIG 76 09/09/2012   HDL 50 09/09/2012   LDLCALC 77 09/09/2012   ALT 16 03/24/2013   AST 21 03/24/2013   NA 139 03/24/2013   K 4.2 03/24/2013   CL 105 03/24/2013   CREATININE 0.85 03/24/2013   BUN 10 03/24/2013   CO2 27 03/24/2013   TSH 1.194 12/18/2012   INR 0.9 08/15/2010   HGBA1C  Value: 5.2 (NOTE)                                                                       According to the ADA Clinical Practice Recommendations for 2011, when HbA1c is used as a screening test:   >=6.5%   Diagnostic of Diabetes Mellitus           (if abnormal result  is confirmed)  5.7-6.4%   Increased risk of developing Diabetes Mellitus  References:Diagnosis and Classification of Diabetes Mellitus,Diabetes XHBZ,1696,78(LFYBO 1):S62-S69 and Standards of Medical Care in         Diabetes - 2011,Diabetes FBPZ,0258,52  (Suppl 1):S11-S61. 03/13/2010   Echo Study Conclusions from October 2013  Left ventricle: The cavity size was normal. Wall thickness was normal. Systolic function was normal. The estimated ejection fraction was in the range of 55% to 60%. Wall motion was normal; there were no regional wall motion abnormalities. Left ventricular diastolic function parameters were normal.  Assessment / Plan:  1. Dizziness/palpitations/presyncope - Dr. Burt Knack had suggested referral to a tertiary system at her last visit here - may be time to consider - she does not want to go back to  [facility]. I wonder if a loop recorder implant  would be beneficial to document her spells. Will ask Dr. Burt Knack to review and offer his suggestions.   2. ASD closure   Patient is agreeable to this plan and will call if any problems develop in the interim.   Burtis Junes, RN, [facility] [address] [place], [place]  [address] [phone]

## 2013-04-26 ENCOUNTER — Encounter: Payer: Self-pay | Admitting: *Deleted

## 2013-04-26 ENCOUNTER — Encounter: Payer: Self-pay | Admitting: Internal Medicine

## 2013-04-26 ENCOUNTER — Ambulatory Visit (INDEPENDENT_AMBULATORY_CARE_PROVIDER_SITE_OTHER): Payer: BC Managed Care – PPO | Admitting: Internal Medicine

## 2013-04-26 VITALS — BP 110/77 | HR 92 | Ht 65.0 in | Wt 142.0 lb

## 2013-04-26 DIAGNOSIS — R002 Palpitations: Secondary | ICD-10-CM

## 2013-04-26 DIAGNOSIS — R42 Dizziness and giddiness: Secondary | ICD-10-CM

## 2013-04-26 DIAGNOSIS — R55 Syncope and collapse: Secondary | ICD-10-CM

## 2013-04-26 NOTE — Patient Instructions (Signed)
Your physician has recommended that you wear an event monitor (15 day Zio patch) . Event monitors are medical devices that record the heart's electrical activity. Doctors most often [place] these monitors to diagnose arrhythmias. Arrhythmias are problems with the speed or rhythm of the heartbeat. The monitor is a small, portable device. You can wear one while you do your normal daily activities. This is usually used to diagnose what is causing palpitations/syncope (passing out).  Your physician has recommended that you have a tilt table test. This test is sometimes used to help determine the cause of fainting spells. You lie on a table that moves from a lying down to an upright position. The change in position can bring on loss of consciousness. The doctor monitors your symptoms, heart rate, EKG, and blood pressure throughout the test. The doctor also may give you a medicine and then monitor your response to the medicine. This is done in the hospital and usually takes half of a day to complete the procedure. Please see the instruction sheet given to you today for more information.  You are scheduled for 5/13 -- will call you with a time  Your physician recommends that you schedule a follow-up appointment in: 2 months with Dr. Caryl Comes.

## 2013-04-26 NOTE — Progress Notes (Signed)
Patient Care Team: Alycia Rossetti, MD as PCP - General (Family Medicine)   HPI  Candice Estrada is a 38 y.o. female Seen in followup for syncope in the context of a prior ASD closure that had been undertaken because of TIAs.  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 The symptoms are described as the abrupt onset of a vertiginous-like sensation accompanied by tinnitus. They're associated with rapid heart rates and high blood pressures in the weight of the events. She thinks that she is able to stand although when she tries to walk she feels like she is "drunk". These episodes typically lasts a number of minutes.  She feels as if she is losing blood pressure and is described as pale. She does not have Jacuzzi or heat intolerance.    She notes no difference in her Florinef/ProAmatine    Past Medical History  Diagnosis Date  . Ovarian cyst   . Urinary incontinence   . Migraine headache   . Anxiety   . ASD (atrial septal defect), ostium secundum     Status post closure 08/20/10 with Dr. Burt Knack; procedure complicated by right groin hematoma and acute blood loss anemia  . History of TIAs   . Vitamin B 12 deficiency   . H. pylori infection 2012  . HSV-1 (herpes simplex virus 1) infection 2013    Positive antibiotidies -no history of outbreak   . Irregular bleeding 08/03/2012  . Stroke   . IUD (intrauterine device) in place 12/16/2012  . Thyroid enlarged 12/16/2012    History of cyst will get [place]    Past Surgical History  Procedure Laterality Date  . Thyroid cyst rem    . Tonsillectomy    . Foot neuroma surgery  2009    left  . Thyroid cyst excision    . Asd repair  08/20/2010  . Cardiac surgery      Current Outpatient Prescriptions  Medication Sig Dispense Refill  . aspirin EC 81 MG tablet Take 81 mg by mouth daily.        . busPIRone (BUSPAR) 7.5 MG tablet Take 1 tablet (7.5 mg total) by mouth 2 (two) times daily.  60 tablet  3  . fludrocortisone (FLORINEF) 0.1 MG tablet Take 1 tablet (0.1 mg total) by mouth 2 (two) times daily.  60 tablet  3  . ibuprofen (ADVIL,MOTRIN) 800 MG tablet Take 1 tablet (800 mg total) by mouth every 8 (eight) hours as needed.  30 tablet  2  . midodrine (PROAMATINE) 5 MG tablet Take 1 tablet (5 mg total) by mouth 3 (three) times daily with meals.  90 tablet  11  . Multiple Vitamin (MULTIVITAMIN WITH MINERALS) TABS Take 1 tablet by mouth daily.       No current facility-administered medications for this visit.    No Known Allergies  Review of Systems negative except from HPI and PMH  Physical Exam BP 110/77  Pulse 92  Ht 5' 5"  (1.651 m)  Wt 142 lb (64.411 kg)  BMI 23.63 kg/m2  LMP 03/06/2013 Well developed and well nourished in no acute distress HENT normal E scleral and icterus clear Neck Supple JVP flat; carotids brisk and full Clear to ausculation  Regular rate and rhythm, no murmurs gallops or rub Soft with active bowel sounds No clubbing  cyanosis  Edema Alert and oriented, grossly normal motor and sensory function she has horizontal but no vertical nystagmus Skin Warm and Dry    Assessment and  Plan  Tinnitus/vertigo  ASD status post repair  The patient spells to me a sound more neurological. However, both her neurologist-clinical and neurologist employer are not inclined to this opinion.  It is possible that this is an unusual manifestation of autonomic dysfunction. We will start by ordering a ZIO patch  to try and ascertain what is the rhythm associated with these spells as it is been recorded as being about 140 and she is also hypertensive albeit now either seated or lying down.  If this is unrevealing we will undertake tilt table testing. I've also suggested that we consider referral to a vestibular neurologist.

## 2013-04-28 ENCOUNTER — Encounter (INDEPENDENT_AMBULATORY_CARE_PROVIDER_SITE_OTHER): Payer: BC Managed Care – PPO

## 2013-04-28 ENCOUNTER — Other Ambulatory Visit: Payer: Self-pay | Admitting: Family Medicine

## 2013-04-28 ENCOUNTER — Encounter: Payer: Self-pay | Admitting: *Deleted

## 2013-04-28 DIAGNOSIS — N644 Mastodynia: Secondary | ICD-10-CM

## 2013-04-28 DIAGNOSIS — R42 Dizziness and giddiness: Secondary | ICD-10-CM

## 2013-04-28 DIAGNOSIS — R55 Syncope and collapse: Secondary | ICD-10-CM

## 2013-04-28 DIAGNOSIS — R002 Palpitations: Secondary | ICD-10-CM

## 2013-04-28 DIAGNOSIS — R5601 Complex febrile convulsions: Secondary | ICD-10-CM

## 2013-04-28 NOTE — Progress Notes (Signed)
Patient ID: Candice Estrada, female   DOB: Feb 08, 1975, 38 y.o.   MRN: [id number] 14 Day Zio Patch cardiac monitor applied to patient.

## 2013-04-29 ENCOUNTER — Telehealth: Payer: Self-pay | Admitting: *Deleted

## 2013-04-29 ENCOUNTER — Encounter: Payer: Self-pay | Admitting: *Deleted

## 2013-04-29 NOTE — Telephone Encounter (Signed)
Called pt to confirm date/time of tilt table testing. Informed her that no pre procedure lab work is needed for this testing. Updated letter of instructions to show this change. Reviewed testing instructions with pt. She verbalized understanding.

## 2013-04-29 NOTE — Telephone Encounter (Signed)
Left message for patient to call back to review tilt table instructions/date/time. Letter of instructions mailed to patient's home address.

## 2013-05-07 ENCOUNTER — Other Ambulatory Visit: Payer: BC Managed Care – PPO

## 2013-05-20 ENCOUNTER — Telehealth: Payer: Self-pay | Admitting: *Deleted

## 2013-05-20 NOTE — Telephone Encounter (Signed)
Message copied by Sheral Flow on Thu May 20, 2013  9:31 AM ------      Message from: Vic Blackbird F      Created: Wed May 19, 2013  4:54 PM       Clarify with pt, that OV was a physical, anxiety also discussed, no headache disorder discussed at that time.       ----- Message -----         From: Eden Lathe Elysha Daw, LPN         Sent: 04/21/2705  12:35 PM           To: Alycia Rossetti, MD            MD please advise.             ----- Message -----         From: Devoria Glassing         Sent: 05/19/2013  11:54 AM           To: Eden Lathe Kenlie Seki, LPN            Patient is wanting just a sentence on our letter head to be faxed to her, so she can give to her ins company saying that she was first seen here on 09-09-12 for her headaches. I was not comfortable doing this because I see nothing in that ov note that says anything about a headache.              ------

## 2013-05-20 NOTE — Telephone Encounter (Signed)
Returned call to patient to clarify.   States that MRI that was obtained in 03/2013 is being denied by insurance d/t dx of headaches.   Order requisition  stated TIA and dizziness as dx. MRI report states headache and dizziness and clinical need.   MD please advise.

## 2013-05-21 ENCOUNTER — Ambulatory Visit: Payer: BC Managed Care – PPO | Admitting: Cardiovascular Disease

## 2013-05-21 NOTE — Telephone Encounter (Signed)
I called pts Candice Estrada and they sent me to [place] I questioned to see if pt has been approved for the MRI and I had been her reference number is [id number], I asked for a fax to be sent over and they stated they do not send faxes over but that pt can call and request for her information with the reference number. There was nothing in the exam stating headaches it was for what was on the order when referral was placed.

## 2013-05-21 NOTE — Telephone Encounter (Signed)
Candice Estrada please look at the claim for this MRI, all MRI have to be approved before they can be scheduled, see why she is saying this was denied

## 2013-05-21 NOTE — Telephone Encounter (Signed)
Please call pt, since MRI was covered and clarify what she is needing documentation of

## 2013-05-25 ENCOUNTER — Telehealth: Payer: Self-pay | Admitting: Internal Medicine

## 2013-05-25 NOTE — Telephone Encounter (Signed)
Pt calls in wanting to cancel tilt testing (secondary to out-of-pocket cost) for the time being. She would like to wait and see Dr. Caryl Comes in June and discuss monitor results first and determine is tilt testing is still needed.

## 2013-05-25 NOTE — Telephone Encounter (Signed)
New message          Pt is cancelling surgery at the hospital

## 2013-05-25 NOTE — Telephone Encounter (Signed)
New Message  Pt called to resch Tilt table test. Please call

## 2013-05-25 NOTE — Telephone Encounter (Signed)
Follow up     Pt is asking for Candice Estrada to call to resc tilt table test

## 2013-05-26 ENCOUNTER — Ambulatory Visit (HOSPITAL_COMMUNITY)
Admission: RE | Admit: 2013-05-26 | Payer: BC Managed Care – PPO | Source: Ambulatory Visit | Admitting: Internal Medicine

## 2013-05-26 ENCOUNTER — Encounter (HOSPITAL_COMMUNITY): Admission: RE | Payer: Self-pay | Source: Ambulatory Visit

## 2013-05-26 SURGERY — TILT TABLE STUDY
Anesthesia: LOCAL

## 2013-05-28 ENCOUNTER — Ambulatory Visit (INDEPENDENT_AMBULATORY_CARE_PROVIDER_SITE_OTHER): Payer: BC Managed Care – PPO | Admitting: Family Medicine

## 2013-05-28 ENCOUNTER — Encounter: Payer: Self-pay | Admitting: Family Medicine

## 2013-05-28 VITALS — BP 90/60 | HR 68 | Temp 97.2°F | Resp 14 | Ht 63.0 in | Wt 141.0 lb

## 2013-05-28 DIAGNOSIS — T7840XA Allergy, unspecified, initial encounter: Secondary | ICD-10-CM

## 2013-05-28 MED ORDER — PREDNISONE 20 MG PO TABS: ORAL_TABLET | ORAL | Status: DC

## 2013-05-28 NOTE — Progress Notes (Signed)
Subjective:    Patient ID: Candice Estrada, female    DOB: 09/14/1975, 38 y.o.   MRN: [id number]  HPI Patient was stung on the forearm yesterday at a baseball game by some type of insect. She now has a hive on the dorsum of her right forearm with swelling and erythema encompassing an area 6 cm by 10 cm.  she denies any other hives, stridor, shortness of breath, wheezing. She denies feeling faint or dizzy.  It is extremely hot and tender. Past Medical History  Diagnosis Date  . Ovarian cyst   . Urinary incontinence   . Migraine headache   . Anxiety   . ASD (atrial septal defect), ostium secundum     Status post closure 08/20/10 with Dr. Burt Knack; procedure complicated by right groin hematoma and acute blood loss anemia  . History of TIAs   . Vitamin B 12 deficiency   . H. pylori infection 2012  . HSV-1 (herpes simplex virus 1) infection 2013    Positive antibiotidies -no history of outbreak   . Irregular bleeding 08/03/2012  . Stroke   . IUD (intrauterine device) in place 12/16/2012  . Thyroid enlarged 12/16/2012    History of cyst will get [place]   Current Outpatient Prescriptions on File Prior to Visit  Medication Sig Dispense Refill  . aspirin EC 81 MG tablet Take 81 mg by mouth daily.      . fludrocortisone (FLORINEF) 0.1 MG tablet Take 1 tablet (0.1 mg total) by mouth 2 (two) times daily.  60 tablet  3  . ibuprofen (ADVIL,MOTRIN) 800 MG tablet Take 1 tablet (800 mg total) by mouth every 8 (eight) hours as needed.  30 tablet  2  . midodrine (PROAMATINE) 5 MG tablet Take 1 tablet (5 mg total) by mouth 3 (three) times daily with meals.  90 tablet  11  . Multiple Vitamin (MULTIVITAMIN WITH MINERALS) TABS Take 1 tablet by mouth daily.       No current facility-administered medications on file prior to visit.   No Known Allergies History   Social History  . Marital Status: Divorced    Spouse Name: N/A    Number of Children: 2  . Years of Education: College   Occupational History    . 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 Smoking status: Never Smoker   . Smokeless tobacco: Never Used  . Alcohol Use: Yes     Comment: very rarely  . Drug Use: No  . Sexual Activity: Yes    Birth Control/ Protection: IUD   Other Topics Concern  . Not on file   Social History Narrative   Patient lives at home with her family.   Caffeine Use: rarely      Review of Systems  All other systems reviewed and are negative.      Objective:   Physical Exam  Vitals reviewed. Constitutional: She appears well-developed and well-nourished. No distress.  HENT:  Right Ear: External ear normal.  Left Ear: External ear normal.  Nose: Nose normal.  Mouth/Throat: Oropharynx is clear and moist. No oropharyngeal exudate.  Eyes: Conjunctivae are normal. Pupils are equal, round, and reactive to light. Right eye exhibits no discharge. Left eye exhibits no discharge. No scleral icterus.  Neck: Normal range of motion. Neck supple.  Cardiovascular: Normal rate, regular rhythm and normal heart sounds.   Pulmonary/Chest: Effort normal and breath sounds normal. No stridor. No respiratory distress. She has  no wheezes. She has no rales.  Lymphadenopathy:    She has no cervical adenopathy.  Skin: Rash noted. She is not diaphoretic. There is erythema.          Assessment & Plan:  1. Allergic reaction Begin patient on a prednisone taper pack and also start Benadryl 25 mg every 6 hours as needed for itching and swelling. - predniSONE (DELTASONE) 20 MG tablet; 3 tabs poqday 1-2, 2 tabs poqday 3-4, 1 tab poqday 5-6  Dispense: 12 tablet; Refill: 0

## 2013-06-28 ENCOUNTER — Ambulatory Visit: Payer: BC Managed Care – PPO | Admitting: Family Medicine

## 2013-06-30 ENCOUNTER — Encounter: Payer: Self-pay | Admitting: Internal Medicine

## 2013-06-30 ENCOUNTER — Ambulatory Visit (INDEPENDENT_AMBULATORY_CARE_PROVIDER_SITE_OTHER): Payer: BC Managed Care – PPO | Admitting: Internal Medicine

## 2013-06-30 VITALS — BP 110/71 | HR 75 | Ht 63.0 in | Wt 140.0 lb

## 2013-06-30 DIAGNOSIS — R002 Palpitations: Secondary | ICD-10-CM

## 2013-06-30 DIAGNOSIS — R55 Syncope and collapse: Secondary | ICD-10-CM

## 2013-06-30 MED ORDER — PROPRANOLOL HCL 20 MG PO TABS: 20.0000 mg | ORAL_TABLET | Freq: Two times a day (BID) | ORAL | Status: DC

## 2013-06-30 NOTE — Patient Instructions (Addendum)
Your physician has recommended you make the following change in your medication:  1) STOP Fludrocortisone 2) STOP Midodrine 3) START Inderal 20 mg twice daily  Your physician has requested that you have an echocardiogram. Echocardiography is a painless test that uses sound waves to create images of your heart. It provides your doctor with information about the size and shape of your heart and how well your heart's chambers and valves are working. This procedure takes approximately one hour. There are no restrictions for this procedure.  Your physician recommends you have a signal average EKG.  Your physician wants you to follow-up in: November with Dr .Caryl Comes.  You will receive a reminder letter in the mail two months in advance. If you don't receive a letter, please call our office to schedule the follow-up appointment.

## 2013-06-30 NOTE — Progress Notes (Signed)
      Patient Care Team: Alycia Rossetti, MD as PCP - General (Family Medicine)   HPI  Candice Estrada is a 38 y.o. 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 There is no change with Florinef and ProAmatine.   My last assessment I wonder whether they were  Neurological.  No further valuation along that course has been pursued.  Past Medical History  Diagnosis Date  . Ovarian cyst   . Urinary incontinence   . Migraine headache   . Anxiety   . ASD (atrial septal defect), ostium secundum     Status post closure 08/20/10 with Dr. Burt Knack; procedure complicated by right groin hematoma and acute blood loss anemia  . History of TIAs   . Vitamin B 12 deficiency   . H. pylori infection 2012  . HSV-1 (herpes simplex virus 1) infection 2013    Positive antibiotidies -no history of outbreak   . Irregular bleeding 08/03/2012  . Stroke   . IUD (intrauterine device) in place 12/16/2012  . Thyroid enlarged 12/16/2012    History of cyst will get [place]    Past Surgical History  Procedure Laterality Date  . Thyroid cyst rem    . Tonsillectomy    . Foot neuroma surgery  2009    left  . Thyroid cyst excision    . Asd repair  08/20/2010  . Cardiac surgery      Current Outpatient Prescriptions  Medication Sig Dispense Refill  . aspirin EC 81 MG tablet Take 81 mg by mouth daily.      . fludrocortisone (FLORINEF) 0.1 MG tablet Take 1 tablet (0.1 mg total) by mouth 2 (two) times daily.  60 tablet  3  . ibuprofen (ADVIL,MOTRIN) 800 MG tablet Take 1 tablet (800 mg total) by mouth every 8 (eight) hours as needed.  30 tablet  2  . midodrine (PROAMATINE) 5 MG tablet Take 1 tablet (5 mg total) by mouth 3 (three) times daily with meals.  90 tablet  11  . Multiple Vitamin (MULTIVITAMIN WITH MINERALS) TABS Take 1 tablet by mouth daily.       No current facility-administered medications for this visit.    No Known  Allergies  Review of Systems negative except from HPI and PMH  Physical Exam BP 110/71  Pulse 75  Ht 5' 3"  (1.6 m)  Wt 140 lb (63.504 kg)  BMI 24.81 kg/m2 Well developed and well nourished in no acute distress HENT normal E scleral and icterus clear Neck Supple JVP flat; carotids brisk and full Clear to ausculation regular rate and rhythm, no murmurs gallops or rub Soft with active bowel sounds No clubbing cyanosis  Edema Alert and oriented, grossly normal motor and sensory function Skin Warm and Dry    Assessment and  Plan  Dizziness  VT NS  ASD repair  Will stop florinef and proamatine  Will consider tilt table testing   We will get an echo and a signal average ECG given the nonsustained ventricular tachycardia also somewhat surprised.  We will try her on low-dose beta blocker Inderal 20 mg twice daily.

## 2013-07-19 ENCOUNTER — Ambulatory Visit (HOSPITAL_COMMUNITY)
Admission: RE | Admit: 2013-07-19 | Discharge: 2013-07-19 | Disposition: A | Discharge status: Home / Self Care | Payer: BC Managed Care – PPO | Source: Ambulatory Visit | Attending: Internal Medicine | Admitting: Internal Medicine

## 2013-07-19 DIAGNOSIS — I498 Other specified cardiac arrhythmias: Secondary | ICD-10-CM | POA: Insufficient documentation

## 2013-07-19 DIAGNOSIS — R42 Dizziness and giddiness: Secondary | ICD-10-CM | POA: Insufficient documentation

## 2013-07-20 ENCOUNTER — Ambulatory Visit (HOSPITAL_COMMUNITY): Payer: BC Managed Care – PPO | Attending: Internal Medicine | Admitting: Radiology

## 2013-07-20 DIAGNOSIS — R42 Dizziness and giddiness: Secondary | ICD-10-CM | POA: Insufficient documentation

## 2013-07-20 DIAGNOSIS — Z8673 Personal history of transient ischemic attack (TIA), and cerebral infarction without residual deficits: Secondary | ICD-10-CM | POA: Insufficient documentation

## 2013-07-20 DIAGNOSIS — Q211 Atrial septal defect: Secondary | ICD-10-CM

## 2013-07-20 DIAGNOSIS — R55 Syncope and collapse: Secondary | ICD-10-CM

## 2013-07-20 DIAGNOSIS — Q2111 Secundum atrial septal defect: Secondary | ICD-10-CM | POA: Insufficient documentation

## 2013-07-20 DIAGNOSIS — R002 Palpitations: Secondary | ICD-10-CM | POA: Insufficient documentation

## 2013-07-20 NOTE — Progress Notes (Signed)
Echocardiogram performed.  

## 2013-08-06 ENCOUNTER — Ambulatory Visit: Payer: BC Managed Care – PPO | Admitting: Family Medicine

## 2013-08-17 ENCOUNTER — Other Ambulatory Visit: Payer: Self-pay

## 2013-08-17 DIAGNOSIS — Z1231 Encounter for screening mammogram for malignant neoplasm of breast: Secondary | ICD-10-CM

## 2013-09-29 ENCOUNTER — Encounter: Payer: BC Managed Care – PPO | Admitting: Family Medicine

## 2013-10-13 ENCOUNTER — Ambulatory Visit: Payer: BC Managed Care – PPO

## 2013-10-20 ENCOUNTER — Encounter: Payer: Self-pay | Admitting: Physician Assistant

## 2013-10-20 ENCOUNTER — Ambulatory Visit (INDEPENDENT_AMBULATORY_CARE_PROVIDER_SITE_OTHER): Payer: BC Managed Care – PPO | Admitting: Physician Assistant

## 2013-10-20 ENCOUNTER — Other Ambulatory Visit: Payer: Self-pay | Admitting: Adult Health

## 2013-10-20 VITALS — BP 100/58 | HR 76 | Temp 98.2°F | Resp 18 | Wt 144.0 lb

## 2013-10-20 DIAGNOSIS — J320 Chronic maxillary sinusitis: Secondary | ICD-10-CM

## 2013-10-20 MED ORDER — PREDNISONE 20 MG PO TABS: ORAL_TABLET | ORAL | Status: DC

## 2013-10-20 MED ORDER — AMOXICILLIN-POT CLAVULANATE 875-125 MG PO TABS: 1.0000 | ORAL_TABLET | Freq: Two times a day (BID) | ORAL | Status: DC

## 2013-10-20 NOTE — Progress Notes (Signed)
Patient ID: Candice Estrada MRN: [id number], DOB: 01-18-1975, 38 y.o. Date of Encounter: 10/20/2013, 10:39 AM    Chief Complaint:  Chief Complaint  Patient presents with  . rt sided facial swelling    x 5 days  denies any dental issues, started in ear, tried some amoxicilin she had at home     HPI: 38 y.o. year old white female --works at [employer]. Almyra Free says that on Saturday 10/16/13 when she woke up she had a bad headache and her nose was stopped up. On Sunday 10/17/13 she had the same symptoms-- a bad headache and nose being stopped up. On Monday 10/18/13 her right face seemed swollen. Her right cheek area has continued to be swollen. She says that when she lays on different sides when she is sleeping she can feel pressure and fluid moving in her right maxillary sinus area. Says that she is not able to blow out much mucus she says that she can taste nasty drainage draining down her throat. Also has no significant cough or chest congestion. Has had no fevers or chills. Has had no toothache.     Home Meds:   Outpatient Prescriptions Prior to Visit  Medication Sig Dispense Refill  . aspirin EC 81 MG tablet Take 81 mg by mouth daily.      Marland Kitchen ibuprofen (ADVIL,MOTRIN) 800 MG tablet Take 1 tablet (800 mg total) by mouth every 8 (eight) hours as needed.  30 tablet  2  . Multiple Vitamin (MULTIVITAMIN WITH MINERALS) TABS Take 1 tablet by mouth daily.      . propranolol (INDERAL) 20 MG tablet Take 1 tablet (20 mg total) by mouth 2 (two) times daily.  60 tablet  5   No facility-administered medications prior to visit.    Allergies: No Known Allergies    Review of Systems: See HPI for pertinent ROS. All other ROS negative.    Physical Exam: Blood pressure 100/58, pulse 76, temperature 98.2 F (36.8 C), temperature source Oral, resp. rate 18, weight 144 lb (65.318 kg)., Body mass index is 25.51 kg/(m^2). General: WNWD WF.  Appears in no acute  distress. HEENT: Normocephalic, atraumatic, eyes without discharge, sclera non-icteric, nares are without discharge. Bilateral auditory canals clear, TM's are without perforation, pearly grey and translucent with reflective cone of light bilaterally. Oral cavity moist, posterior pharynx without exudate, erythema, peritonsillar abscess. Right maxillary sinus is visibly swollen and is tender with percussion. Left maxillary sinus is not swollen and is nontender with percussion. Frontal sinuses are nontender with percussion.  Neck: Supple. No thyromegaly. No lymphadenopathy. Lungs: Clear bilaterally to auscultation without wheezes, rales, or rhonchi. Breathing is unlabored. Heart: Regular rhythm. No murmurs, rubs, or gallops. Msk:  Strength and tone normal for age. Extremities/Skin: Warm and dry. No rashes. Neuro: Alert and oriented X 3. Moves all extremities spontaneously. Gait is normal. CNII-XII grossly in tact. Psych:  Responds to questions appropriately with a normal affect.     ASSESSMENT AND PLAN:  38 y.o. year old female with  1. Right maxillary sinusitis - amoxicillin-clavulanate (AUGMENTIN) 875-125 MG per tablet; Take 1 tablet by mouth 2 (two) times daily.  Dispense: 20 tablet; Refill: 0 - predniSONE (DELTASONE) 20 MG tablet; Take 3 daily for 2 days, then 2 daily for 2 days, then 1 daily for 2 days.  Dispense: 12 tablet; Refill: 0 If symptoms worsen or if symptoms do not resolve with completion of antibiotic and prednisone, then followup.  Signed, Karis Juba,  PA, BSFM 10/20/2013 10:39 AM

## 2013-10-22 ENCOUNTER — Ambulatory Visit
Admission: RE | Admit: 2013-10-22 | Discharge: 2013-10-22 | Disposition: A | Discharge status: Home / Self Care | Payer: BC Managed Care – PPO | Source: Ambulatory Visit

## 2013-10-22 DIAGNOSIS — Z1231 Encounter for screening mammogram for malignant neoplasm of breast: Secondary | ICD-10-CM

## 2013-11-02 ENCOUNTER — Telehealth: Payer: Self-pay | Admitting: Family Medicine

## 2013-11-02 DIAGNOSIS — J320 Chronic maxillary sinusitis: Secondary | ICD-10-CM

## 2013-11-02 NOTE — Telephone Encounter (Signed)
Patient was seen for R sided sinus infection on 10/20/2013. Augmentin and Prednisone given.   MD please advise.

## 2013-11-02 NOTE — Telephone Encounter (Signed)
Forward to [facility] who just saw pt for this

## 2013-11-02 NOTE — Telephone Encounter (Signed)
Patient is calling to let you know that her face is starting to swell up again and could you please call in another antibiotic for her or does she need to come in? [phone]

## 2013-11-03 MED ORDER — PREDNISONE 20 MG PO TABS
ORAL_TABLET | ORAL | Status: DC
Start: 2013-11-03 — End: 2013-12-15

## 2013-11-03 MED ORDER — AMOXICILLIN-POT CLAVULANATE 875-125 MG PO TABS: 1.0000 | ORAL_TABLET | Freq: Two times a day (BID) | ORAL | Status: DC

## 2013-11-03 NOTE — Telephone Encounter (Signed)
RX's were sent

## 2013-11-03 NOTE — Telephone Encounter (Signed)
I called patient.  She states that she took all of the Augmentin and prednisone as directed.  She states that the symptoms seemed to be completely resolved by the time she finished those medicines. However states the night before last-- the pain and pressure started reoccurring in that same sinus area. Now she is also  feeling and tasting the drainage down her throat again like she was in the past. I told her I would treat with another round of antibiotic and prednisone. Told her to follow up immediately if symptoms worsen any further or if they do not completely resolve with completion of this round of medication. Please send order for: Augmentin 875 mg 1 by mouth twice a day x14 days #28+0 refills Prednisone 20 mg tablets-- take 3 pills for 2 days, then 2 pills for 2 days, then one pill for 2 days. # 12 + 0

## 2013-11-08 ENCOUNTER — Other Ambulatory Visit: Payer: Self-pay | Admitting: Adult Health

## 2013-11-15 ENCOUNTER — Encounter: Payer: Self-pay | Admitting: Physician Assistant

## 2013-11-19 ENCOUNTER — Telehealth: Payer: Self-pay | Admitting: *Deleted

## 2013-11-19 MED ORDER — IBUPROFEN 800 MG PO TABS: 800.0000 mg | ORAL_TABLET | Freq: Three times a day (TID) | ORAL | Status: DC | PRN

## 2013-11-19 NOTE — Telephone Encounter (Signed)
Received fax requesting refill on ibuprofen 874m.   Ok to refill??  Last office visit 10/20/2013.  Last refill 03/24/2013, #2 refills.

## 2013-11-19 NOTE — Telephone Encounter (Signed)
Okay to refill? 

## 2013-11-19 NOTE — Telephone Encounter (Signed)
Refill appropriate and filled per protocol. 

## 2013-12-01 ENCOUNTER — Ambulatory Visit: Payer: BC Managed Care – PPO | Admitting: Internal Medicine

## 2013-12-15 ENCOUNTER — Ambulatory Visit (INDEPENDENT_AMBULATORY_CARE_PROVIDER_SITE_OTHER): Payer: BC Managed Care – PPO | Admitting: Internal Medicine

## 2013-12-15 ENCOUNTER — Encounter: Payer: Self-pay | Admitting: Internal Medicine

## 2013-12-15 VITALS — BP 120/64 | HR 74 | Ht 63.0 in | Wt 144.0 lb

## 2013-12-15 DIAGNOSIS — Q211 Atrial septal defect, unspecified: Secondary | ICD-10-CM

## 2013-12-15 DIAGNOSIS — I951 Orthostatic hypotension: Secondary | ICD-10-CM

## 2013-12-15 DIAGNOSIS — I4729 Other ventricular tachycardia: Secondary | ICD-10-CM

## 2013-12-15 DIAGNOSIS — I472 Ventricular tachycardia: Secondary | ICD-10-CM

## 2013-12-15 DIAGNOSIS — R42 Dizziness and giddiness: Secondary | ICD-10-CM

## 2013-12-15 DIAGNOSIS — G90A Postural orthostatic tachycardia syndrome (POTS): Secondary | ICD-10-CM

## 2013-12-15 DIAGNOSIS — I498 Other specified cardiac arrhythmias: Secondary | ICD-10-CM

## 2013-12-15 DIAGNOSIS — R000 Tachycardia, unspecified: Secondary | ICD-10-CM

## 2013-12-15 NOTE — Patient Instructions (Signed)
Your physician has recommended you make the following change in your medication:  1) STOP Propranolol  Start taking salt tablets -- Thermatabs  Start using thigh high support hose  Website: [facility]  Your physician wants you to follow-up in: May 2016 with Dr. Caryl Comes.  You will receive a reminder letter in the mail two months in advance. If you don't receive a letter, please call our office to schedule the follow-up appointment.

## 2013-12-15 NOTE — Progress Notes (Signed)
Patient Care Team: Alycia Rossetti, MD as PCP - General (Family Medicine)   HPI  Candice Estrada is a 38 y.o. female Are seen in followup for syncope in the context of a prior ASD closure  TILT ws scheduled but not done 2/2 cost;  ?    Echocardiogram was normal. signal average ECG wsa normal   She struggled with exercise tachycardia. This is accompanied by shortness of breath. She describes her volume status as being replete. Her salt intake is modest.  She has violaceous discoloration of her lower extremities with standing.  fQRS HFLA <40 RMS40  96 26 56      Past Medical History  Diagnosis Date  . Ovarian cyst   . Urinary incontinence   . Migraine headache   . Anxiety   . ASD (atrial septal defect), ostium secundum     Status post closure 08/20/10 with Dr. Burt Knack; procedure complicated by right groin hematoma and acute blood loss anemia  . History of TIAs   . Vitamin B 12 deficiency   . H. pylori infection 2012  . HSV-1 (herpes simplex virus 1) infection 2013    Positive antibiotidies -no history of outbreak   . Irregular bleeding 08/03/2012  . Stroke   . IUD (intrauterine device) in place 12/16/2012  . Thyroid enlarged 12/16/2012    History of cyst will get [place]    Past Surgical History  Procedure Laterality Date  . Thyroid cyst rem    . Tonsillectomy    . Foot neuroma surgery  2009    left  . Thyroid cyst excision    . Asd repair  08/20/2010  . Cardiac surgery      Current Outpatient Prescriptions  Medication Sig Dispense Refill  . aspirin EC 81 MG tablet Take 81 mg by mouth daily.    Marland Kitchen ibuprofen (ADVIL,MOTRIN) 800 MG tablet Take 1 tablet (800 mg total) by mouth every 8 (eight) hours as needed. 30 tablet 2  . Multiple Vitamin (MULTIVITAMIN WITH MINERALS) TABS Take 1 tablet by mouth daily.    . propranolol (INDERAL) 20 MG tablet Take 1 tablet (20 mg total) by mouth 2 (two) times daily. 60 tablet 5   No current facility-administered medications for  this visit.    No Known Allergies  Review of Systems negative except from HPI and PMH  Physical Exam BP 120/64 mmHg  Pulse 74  Ht 5' 3"  (1.6 m)  Wt 144 lb (65.318 kg)  BMI 25.51 kg/m2 Well developed and well nourished in no acute distress HENT normal E scleral and icterus clear Neck Supple JVP flat; carotids brisk and full Clear to ausculation regular rate and rhythm, no murmurs gallops or rub Soft with active bowel sounds No clubbing cyanosis  Edema Alert and oriented, grossly normal motor and sensory function Skin Warm and Dry    Assessment and  Plan  Dizziness  VT NS  ASD repair  POTS   Evaluation of her cardiac structure with echo and signal average ECG demonstrates no abnormalities. This would suggest that there is no significant negative prognostic implication related to her nonsustained ventricular tachycardia  Florinef and ProAmatine were previously unhelpful. Her beta blocker she is also not convinced it helps and so we will stop it.  We have spent time reviewing the physiology. She is appearing to be more like POTS. I've given her the websites for [facility]. com. We will start her on salt supplementation with ThermaTabs. She'll continue to  try to become increasingly volume replete. She will try to wear thigh-high support stockings.   Her symptoms are clearly aggravated by ambient temperature and her menses. I've encouraged her to follow-up with her gynecologist regarding therapy for the latter and we will gather again in the late spring to review strategies for dealing with the former    We spent more than 50% of our >40 min visit in face to face counseling regarding the above

## 2013-12-22 ENCOUNTER — Encounter: Payer: BC Managed Care – PPO | Admitting: Family Medicine

## 2013-12-24 ENCOUNTER — Encounter: Payer: BC Managed Care – PPO | Admitting: Family Medicine

## 2014-01-19 ENCOUNTER — Encounter: Payer: Self-pay | Admitting: Adult Health

## 2014-01-19 ENCOUNTER — Ambulatory Visit (INDEPENDENT_AMBULATORY_CARE_PROVIDER_SITE_OTHER): Payer: BLUE CROSS/BLUE SHIELD | Admitting: Adult Health

## 2014-01-19 VITALS — BP 102/62 | HR 76 | Ht 62.0 in | Wt 144.5 lb

## 2014-01-19 DIAGNOSIS — N949 Unspecified condition associated with female genital organs and menstrual cycle: Secondary | ICD-10-CM | POA: Insufficient documentation

## 2014-01-19 DIAGNOSIS — Z975 Presence of (intrauterine) contraceptive device: Secondary | ICD-10-CM

## 2014-01-19 DIAGNOSIS — E049 Nontoxic goiter, unspecified: Secondary | ICD-10-CM

## 2014-01-19 DIAGNOSIS — R102 Pelvic and perineal pain: Secondary | ICD-10-CM | POA: Insufficient documentation

## 2014-01-19 DIAGNOSIS — Z1212 Encounter for screening for malignant neoplasm of rectum: Secondary | ICD-10-CM

## 2014-01-19 DIAGNOSIS — F419 Anxiety disorder, unspecified: Secondary | ICD-10-CM

## 2014-01-19 DIAGNOSIS — Z01419 Encounter for gynecological examination (general) (routine) without abnormal findings: Secondary | ICD-10-CM

## 2014-01-19 DIAGNOSIS — R195 Other fecal abnormalities: Secondary | ICD-10-CM | POA: Insufficient documentation

## 2014-01-19 LAB — HEMOCCULT GUIAC POC 1CARD (OFFICE): Fecal Occult Blood, POC: POSITIVE

## 2014-01-19 MED ORDER — ESCITALOPRAM OXALATE 10 MG PO TABS
10.0000 mg | ORAL_TABLET | Freq: Every day | ORAL | Status: DC
Start: 2014-01-19 — End: 2014-04-01

## 2014-01-19 NOTE — Patient Instructions (Signed)
Pelvic Pain Female pelvic pain can be caused by many different things and start from a variety of places. Pelvic pain refers to pain that is located in the lower half of the abdomen and between your hips. The pain may occur over a short period of time (acute) or may be reoccurring (chronic). The cause of pelvic pain may be related to disorders affecting the female reproductive organs (gynecologic), but it may also be related to the bladder, kidney stones, an intestinal complication, or muscle or skeletal problems. Getting help right away for pelvic pain is important, especially if there has been severe, sharp, or a sudden onset of unusual pain. It is also important to get help right away because some types of pelvic pain can be life threatening.  CAUSES  Below are only some of the causes of pelvic pain. The causes of pelvic pain can be in one of several categories.   Gynecologic.  Pelvic inflammatory disease.  Sexually transmitted infection.  Ovarian cyst or a twisted ovarian ligament (ovarian torsion).  Uterine lining that grows outside the uterus (endometriosis).  Fibroids, cysts, or tumors.  Ovulation.  Pregnancy.  Pregnancy that occurs outside the uterus (ectopic pregnancy).  Miscarriage.  Labor.  Abruption of the placenta or ruptured uterus.  Infection.  Uterine infection (endometritis).  Bladder infection.  Diverticulitis.  Miscarriage related to a uterine infection (septic abortion).  Bladder.  Inflammation of the bladder (cystitis).  Kidney stone(s).  Gastrointestinal.  Constipation.  Diverticulitis.  Neurologic.  Trauma.  Feeling pelvic pain because of mental or emotional causes (psychosomatic).  Cancers of the bowel or pelvis. EVALUATION  Your caregiver will want to take a careful history of your concerns. This includes recent changes in your health, a careful gynecologic history of your periods (menses), and a sexual history. Obtaining your family  history and medical history is also important. Your caregiver may suggest a pelvic exam. A pelvic exam will help identify the location and severity of the pain. It also helps in the evaluation of which organ system may be involved. In order to identify the cause of the pelvic pain and be properly treated, your caregiver may order tests. These tests may include:   A pregnancy test.  Pelvic ultrasonography.  An X-ray exam of the abdomen.  A urinalysis or evaluation of vaginal discharge.  Blood tests. HOME CARE INSTRUCTIONS   Only take over-the-counter or prescription medicines for pain, discomfort, or fever as directed by your caregiver.   Rest as directed by your caregiver.   Eat a balanced diet.   Drink enough fluids to make your urine clear or pale yellow, or as directed.   Avoid sexual intercourse if it causes pain.   Apply warm or cold compresses to the lower abdomen depending on which one helps the pain.   Avoid stressful situations.   Keep a journal of your pelvic pain. Write down when it started, where the pain is located, and if there are things that seem to be associated with the pain, such as food or your menstrual cycle.  Follow up with your caregiver as directed.  SEEK MEDICAL CARE IF:  Your medicine does not help your pain.  You have abnormal vaginal discharge. SEEK IMMEDIATE MEDICAL CARE IF:   You have heavy bleeding from the vagina.   Your pelvic pain increases.   You feel light-headed or faint.   You have chills.   You have pain with urination or blood in your urine.   You have uncontrolled diarrhea  or vomiting.   You have a fever or persistent symptoms for more than 3 days.  You have a fever and your symptoms suddenly get worse.   You are being physically or sexually abused.  MAKE SURE YOU:  Understand these instructions.  Will watch your condition.  Will get help if you are not doing well or get worse. Document Released:  11/28/2003 Document Revised: 05/17/2013 Document Reviewed: 04/22/2011 [facility] Patient Information 2015 [place], [place]. This information is not intended to replace advice given to you by your health care provider. Make sure you discuss any questions you have with your health care provider. Thyroid US 1/8 at [facility] at 3 pm Return in 1 week for [place] and see me Physical in 1 year Do 3 hemoccult cards  Mammogram yearly\ Generalized Anxiety Disorder Generalized anxiety disorder (GAD) is a mental disorder. It interferes with life functions, including relationships, work, and school. GAD is different from normal anxiety, which everyone experiences at some point in their lives in response to specific life events and activities. Normal anxiety actually helps [place] prepare for and get through these life events and activities. Normal anxiety goes away after the event or activity is over.  GAD causes anxiety that is not necessarily related to specific events or activities. It also causes excess anxiety in proportion to specific events or activities. The anxiety associated with GAD is also difficult to control. GAD can vary from mild to severe. People with severe GAD can have intense waves of anxiety with physical symptoms (panic attacks).  SYMPTOMS The anxiety and worry associated with GAD are difficult to control. This anxiety and worry are related to many life events and activities and also occur more days than not for 6 months or longer. People with GAD also have three or more of the following symptoms (one or more in children):  Restlessness.   Fatigue.  Difficulty concentrating.   Irritability.  Muscle tension.  Difficulty sleeping or unsatisfying sleep. DIAGNOSIS GAD is diagnosed through an assessment by your health care provider. Your health care provider will ask you questions aboutyour mood,physical symptoms, and events in your life. Your health care provider may ask you about your medical  history and use of alcohol or drugs, including prescription medicines. Your health care provider may also do a physical exam and blood tests. Certain medical conditions and the use of certain substances can cause symptoms similar to those associated with GAD. Your health care provider may refer you to a mental health specialist for further evaluation. TREATMENT The following therapies are usually used to treat GAD:   Medication. Antidepressant medication usually is prescribed for long-term daily control. Antianxiety medicines may be added in severe cases, especially when panic attacks occur.   Talk therapy (psychotherapy). Certain types of talk therapy can be helpful in treating GAD by providing support, education, and guidance. A form of talk therapy called cognitive behavioral therapy can teach you healthy ways to think about and react to daily life events and activities.  Stress managementtechniques. These include yoga, meditation, and exercise and can be very helpful when they are practiced regularly. A mental health specialist can help determine which treatment is best for you. Some people see improvement with one therapy. However, other people require a combination of therapies. Document Released: 04/27/2012 Document Revised: 05/17/2013 Document Reviewed: 04/27/2012 [facility] Patient Information 2015 [place], [place]. This information is not intended to replace advice given to you by your health care provider. Make sure you discuss any questions you have  with your health care provider.  

## 2014-01-19 NOTE — Progress Notes (Signed)
Patient ID: Candice Estrada, female   DOB: 09/11/1975, 39 y.o.   MRN: [id number] History of Present Illness: Candice Estrada is a 39 year old white female, married in for gyn exam.She had a normal pap with negative HPV 11/18/11.She is complaining of pelvic pain worse with periods and sometimes with sex, has para guard IUD.She is also complaining of being mean and having anxiety.She is thinking of having IUD removed and having a baby. Has history of TIA.  Current Medications, Allergies, Past Medical History, Past Surgical History, Family History and Social History were reviewed in [employer] record.     Review of Systems: Patient denies any headaches, blurred vision, shortness of breath, chest pain, abdominal pain, problems with bowel movements, urination, or intercourse. No joint pain, see HPI for positives.    Physical Exam:BP 102/62 mmHg  Pulse 76  Ht 5' 2"  (1.575 m)  Wt 144 lb 8 oz (65.545 kg)  BMI 26.42 kg/m2  LMP 01/19/2014 General:  Well developed, well nourished, no acute distress Skin:  Warm and dry Neck:  Midline trachea, enlarged thyroid(had [place] 2014) Lungs; Clear to auscultation bilaterally Breast:  No dominant palpable mass, retraction, or nipple discharge Cardiovascular: Regular rate and rhythm Abdomen:  Soft, non tender, no hepatosplenomegaly Pelvic:  External genitalia is normal in appearance.  The vagina is normal in appearance, with period like blood. The cervix is bulbous, with +IUD strings at os..  Uterus is felt to be normal size, shape, and contour.  No adnexal masses or tenderness noted. Rectal: Good sphincter tone, no polyps, or hemorrhoids felt.  Hemoccult positive Extremities:  No swelling or varicosities noted Psych:  Alert and cooperative,seems happy,still working at Dr [employer]   Impression: Well woman exam no pap Pelvic pain Anxiety Enlarged thyroid IUD in place +hemoccult    Plan: Thyroid US 1/8 at 3 pm at [facility] Return in 1 week  for gyn [place] and see me Rx lexapro 10 mg #30 1 daily with 11 refills Do 3 hemoccult cards and return  Mammogram yearly Pap and physical in 1 year Review handout on pelvic pain

## 2014-01-21 ENCOUNTER — Ambulatory Visit (HOSPITAL_COMMUNITY)
Admission: RE | Admit: 2014-01-21 | Discharge: 2014-01-21 | Disposition: A | Discharge status: Home / Self Care | Payer: BLUE CROSS/BLUE SHIELD | Source: Ambulatory Visit | Attending: Adult Health | Admitting: Adult Health

## 2014-01-21 DIAGNOSIS — E041 Nontoxic single thyroid nodule: Secondary | ICD-10-CM | POA: Insufficient documentation

## 2014-01-21 DIAGNOSIS — E010 Iodine-deficiency related diffuse (endemic) goiter: Secondary | ICD-10-CM | POA: Diagnosis present

## 2014-01-21 DIAGNOSIS — E049 Nontoxic goiter, unspecified: Secondary | ICD-10-CM

## 2014-01-24 ENCOUNTER — Telehealth: Payer: Self-pay | Admitting: Adult Health

## 2014-01-24 NOTE — Telephone Encounter (Signed)
Pt aware of thyroid US

## 2014-01-26 ENCOUNTER — Ambulatory Visit (INDEPENDENT_AMBULATORY_CARE_PROVIDER_SITE_OTHER): Payer: BLUE CROSS/BLUE SHIELD | Admitting: Adult Health

## 2014-01-26 ENCOUNTER — Ambulatory Visit (INDEPENDENT_AMBULATORY_CARE_PROVIDER_SITE_OTHER): Payer: BLUE CROSS/BLUE SHIELD

## 2014-01-26 ENCOUNTER — Other Ambulatory Visit: Payer: Self-pay | Admitting: Adult Health

## 2014-01-26 ENCOUNTER — Encounter: Payer: Self-pay | Admitting: Adult Health

## 2014-01-26 ENCOUNTER — Other Ambulatory Visit: Payer: BLUE CROSS/BLUE SHIELD

## 2014-01-26 VITALS — BP 110/80 | Ht 63.0 in | Wt 144.5 lb

## 2014-01-26 DIAGNOSIS — R102 Pelvic and perineal pain: Secondary | ICD-10-CM

## 2014-01-26 DIAGNOSIS — N83201 Unspecified ovarian cyst, right side: Secondary | ICD-10-CM

## 2014-01-26 DIAGNOSIS — N8320 Unspecified ovarian cysts: Secondary | ICD-10-CM

## 2014-01-26 DIAGNOSIS — N83209 Unspecified ovarian cyst, unspecified side: Secondary | ICD-10-CM | POA: Insufficient documentation

## 2014-01-26 DIAGNOSIS — Z975 Presence of (intrauterine) contraceptive device: Secondary | ICD-10-CM

## 2014-01-26 NOTE — Patient Instructions (Signed)
Follow up prn

## 2014-01-26 NOTE — Progress Notes (Signed)
Subjective:     Patient ID: Candice Estrada, female   DOB: 13-Jul-1975, 39 y.o.   MRN: [id number]  HPI Candice Estrada is a 39 year old white female in for [place] for pelvic pain.  Review of Systems See HPI Reviewed past medical,surgical, social and family history. Reviewed medications and allergies.     Objective:   Physical Exam BP 110/80 mmHg  Ht 5' 3"  (1.6 m)  Wt 144 lb 8 oz (65.545 kg)  BMI 25.60 kg/m2  LMP 01/06/2016US reviewed with pt.   Uterus 8.4 x 6.2 x 4.8 cm, anteverted  Endometrium IUD located centrally within the cavity  Right ovary 4.0 x 3.5 x 29 cm, with simple cyst noted = 3.4 x 3.0x 2.5cm  Left ovary 2.7 x 2.1 x 1.7 cm,   No free fluid noted within the pelvis  Technician Comments:  Anteverted uterus, IUD located centrally within the endometrial cavity, Rt ovary with 3.4 x 3.0x 2.5cm simple cyst, Lt ovary appears WNL, no free fluid noted within the pelvis  Assessment:     Pelvic pain IUD in place Right ovarian cyst    Plan:    Take motrin for pain Follow up prn

## 2014-02-01 ENCOUNTER — Telehealth: Payer: Self-pay | Admitting: Adult Health

## 2014-02-01 ENCOUNTER — Other Ambulatory Visit (INDEPENDENT_AMBULATORY_CARE_PROVIDER_SITE_OTHER): Payer: BLUE CROSS/BLUE SHIELD

## 2014-02-01 DIAGNOSIS — R319 Hematuria, unspecified: Secondary | ICD-10-CM

## 2014-02-01 DIAGNOSIS — Z1212 Encounter for screening for malignant neoplasm of rectum: Secondary | ICD-10-CM

## 2014-02-01 DIAGNOSIS — Z139 Encounter for screening, unspecified: Secondary | ICD-10-CM

## 2014-02-01 LAB — HEMOCCULT GUIAC POC 1CARD (OFFICE)
Card #2 Fecal Occult Blod, POC: NEGATIVE
Card #3 Fecal Occult Blood, POC: NEGATIVE
Fecal Occult Blood, POC: NEGATIVE

## 2014-02-01 NOTE — Telephone Encounter (Signed)
Spoke with pt. Pt came in and dropped off hemocult cards. They were all negative. Pt aware. Pt left a urine sample and I dipped it and it showed 3+ blood. I spoke with JAG and she advised to send urine off for culture. Urine was sent off to lab. [place]

## 2014-02-02 ENCOUNTER — Ambulatory Visit: Payer: BLUE CROSS/BLUE SHIELD | Admitting: Advanced Practice Midwife

## 2014-02-02 ENCOUNTER — Telehealth: Payer: Self-pay | Admitting: Adult Health

## 2014-02-02 LAB — URINALYSIS
Bilirubin Urine: NEGATIVE
Glucose, UA: NEGATIVE mg/dL
KETONES UR: NEGATIVE mg/dL
NITRITE: NEGATIVE
Protein, ur: NEGATIVE mg/dL
SPECIFIC GRAVITY, URINE: 1.019 (ref 1.005–1.030)
Urobilinogen, UA: 1 mg/dL (ref 0.0–1.0)
pH: 5.5 (ref 5.0–8.0)

## 2014-02-02 NOTE — Telephone Encounter (Signed)
Urine had blood in it, but culture not back yet

## 2014-02-03 ENCOUNTER — Telehealth: Payer: Self-pay | Admitting: Adult Health

## 2014-02-03 LAB — URINE CULTURE
Colony Count: NO GROWTH
ORGANISM ID, BACTERIA: NO GROWTH

## 2014-02-03 MED ORDER — METRONIDAZOLE 500 MG PO TABS: 500.0000 mg | ORAL_TABLET | Freq: Two times a day (BID) | ORAL | Status: DC

## 2014-02-03 NOTE — Telephone Encounter (Signed)
Left message culture negative, lets recheck in 2 weeks if still +blood follow up with urologists

## 2014-02-03 NOTE — Telephone Encounter (Signed)
Complains of discharge, has IUD and has used flagyl in past, will refilk flagyl

## 2014-02-03 NOTE — Telephone Encounter (Signed)
Pt c/o vaginal spotting and discharge, requesting refill on metronidazole

## 2014-02-03 NOTE — Addendum Note (Signed)
Addended by: Derrek Monaco A on: 02/03/2014 02:49 PM   Modules accepted: Orders

## 2014-02-18 ENCOUNTER — Other Ambulatory Visit (HOSPITAL_COMMUNITY): Payer: Self-pay | Admitting: Radiology

## 2014-02-18 DIAGNOSIS — G4700 Insomnia, unspecified: Secondary | ICD-10-CM

## 2014-02-18 DIAGNOSIS — G4730 Sleep apnea, unspecified: Secondary | ICD-10-CM

## 2014-02-18 DIAGNOSIS — F513 Sleepwalking [somnambulism]: Secondary | ICD-10-CM

## 2014-03-03 ENCOUNTER — Other Ambulatory Visit: Payer: Self-pay | Admitting: Cardiovascular Disease

## 2014-03-10 ENCOUNTER — Ambulatory Visit: Payer: BLUE CROSS/BLUE SHIELD | Admitting: Adult Health

## 2014-03-31 ENCOUNTER — Ambulatory Visit: Payer: BLUE CROSS/BLUE SHIELD | Admitting: Adult Health

## 2014-04-01 ENCOUNTER — Telehealth: Payer: Self-pay | Admitting: Adult Health

## 2014-04-01 MED ORDER — BUPROPION HCL ER (SR) 150 MG PO TB12: 150.0000 mg | ORAL_TABLET | Freq: Every day | ORAL | Status: DC

## 2014-04-01 NOTE — Telephone Encounter (Signed)
Will D/C lexapro and rx wellbutrin

## 2014-04-01 NOTE — Telephone Encounter (Signed)
Spoke with pt. Pt is having side effects with Lexapro. She has no sexual desire. She has been on Wellbutrin before and wants to go back to that. Please advise. Thanks!! [place]

## 2014-04-06 ENCOUNTER — Telehealth: Payer: Self-pay | Admitting: Internal Medicine

## 2014-04-06 MED ORDER — MIDODRINE HCL 2.5 MG PO TABS: 2.5000 mg | ORAL_TABLET | Freq: Two times a day (BID) | ORAL | Status: DC

## 2014-04-06 NOTE — Telephone Encounter (Signed)
Patient advised to start Proamatine 2.5 mg BID and start using an abdominal binder. Advised to see if this combination will make a difference.  Advised to call if symptoms worsen/don't improve. Patient verbalized understanding and agreeable to plan.

## 2014-04-06 NOTE — Telephone Encounter (Signed)
New Msg       Pt c/o BP issue:  1. What are your last 5 BP readings? 90/50 92/53 88/47  2. Are you having any other symptoms (ex. Dizziness, headache, blurred vision, passed out)? Pt is dizzy at times, worse when sitting down. 3. What is your medication issue? Pt afraid that BP is getting low at night.    Please return call to pt.

## 2014-04-13 ENCOUNTER — Ambulatory Visit (INDEPENDENT_AMBULATORY_CARE_PROVIDER_SITE_OTHER): Payer: BLUE CROSS/BLUE SHIELD | Admitting: Adult Health

## 2014-04-13 ENCOUNTER — Encounter: Payer: Self-pay | Admitting: Adult Health

## 2014-04-13 VITALS — BP 99/70 | HR 75 | Ht 63.0 in | Wt 141.0 lb

## 2014-04-13 DIAGNOSIS — R42 Dizziness and giddiness: Secondary | ICD-10-CM | POA: Diagnosis not present

## 2014-04-13 MED ORDER — MECLIZINE HCL 12.5 MG PO TABS: 12.5000 mg | ORAL_TABLET | Freq: Every day | ORAL | Status: DC | PRN

## 2014-04-13 NOTE — Progress Notes (Signed)
I agree with the assessment and plan as directed by NP .The patient is known to me .   Gailen Venne, MD  

## 2014-04-13 NOTE — Progress Notes (Signed)
PATIENT: Candice Estrada DOB: May 12, 1975  REASON FOR VISIT: follow up- Dizziness HISTORY FROM: patient  HISTORY OF PRESENT ILLNESS:  Candice Estrada is a 39 year old female with a history of dizziness and tinnitus. She returns today for follow-up. She states that her dizziness has not gotten any better. She states that 2 weeks ago she moved her eyes to the left and that causes the room to start spinning during that time she cannot walk due to the dizziness. She states this lasted for 30 seconds to approximately 1 minute. She states she is also had dizziness that occurs when she sitting still. She states that this last episode she became very sweaty during the episode. She denies a headache before during or after these episodes. In the past she's had MRIs that was unremarkable as well as surgery for a patent foreman ovale. The patient even had a repeat Doppler study after her closure of the foramen ovale that was unremarkable. She has been to ENT but they did not complete any testing other than looking in the ears. She also has a cardiologist Candice Estrada has been evaluating her as well. She returns today for evaluation.   HISTORY (Candice Estrada): 87 year [name] with recurrent episodes of transient dizziness and tinnitus of unclear etiology possibly peripheral vestibular dysfunction though headache accompaniment in the majority of these episodes suggest possibility of complicated migraine as well. 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 Update 10/14/10 She returns for followup of her last visit on 01/03/12. She continues to have intermittent transient episodes of dizziness which she says can be provoked by moving her head to the right. She feels off balance and dizzy and at times breaks out into sweat and feels tired after the episode. She also has  ringing in the ears which appears to be more frequent and constant. She denies any loss of hearing. She was evaluated by ENT physician at [facility] who just examined the ears and stated that he did not find anything wrong. She did not undergo any electronystagmogram or other caloric testing. She was seen by vestibular rehabilitation and prescribed some exercises that she has been doing and finds them to be useful. She continues to have these episodes once or twice a month. In about 6-70% of his episodes she gets headache which she describes as moderate in severity generalized complained by a nausea and light and sound sensitivity. She takes ibuprofen which seems to help. She does have a prior history of migraine headaches in the past. She has no other focal neurological complaints. She had transcranial Doppler bubble study from 01/03/12 which was negative without evidence of any significant right-to-left intracardiac shunt. Transcranial Doppler emboli monitoring study showed no spontaneous emboli. She has had 2 negative MRI scans of the brain in 2012 as well as October 2013 without any evidence of demyelinating or neurovascular disease and she appears quite frustrated with these recurrent episodes of dizziness for which there is no apparent explanation.  REVIEW OF SYSTEMS: Out of a complete 14 system review of symptoms, the patient complains only of the following symptoms, and all other reviewed systems are negative.  Ringing in ears, trouble swallowing, blurred vision, palpitations, incontinence of bladder, walking difficulty, acting out dreams, dizziness, passing out  ALLERGIES: No Known Allergies  HOME MEDICATIONS: Outpatient Prescriptions Prior to Visit  Medication Sig  Dispense Refill  . aspirin EC 81 MG tablet Take 81 mg by mouth daily.    Marland Kitchen buPROPion (WELLBUTRIN SR) 150 MG 12 hr tablet Take 1 tablet (150 mg total) by mouth daily. 30 tablet 6  . ibuprofen (ADVIL,MOTRIN) 800 MG tablet Take 1 tablet  (800 mg total) by mouth every 8 (eight) hours as needed. 30 tablet 2  . midodrine (PROAMATINE) 2.5 MG tablet Take 1 tablet (2.5 mg total) by mouth 2 (two) times daily with a meal. 60 tablet 3  . Multiple Vitamin (MULTIVITAMIN WITH MINERALS) TABS Take 1 tablet by mouth daily.    Marland Kitchen PARAGARD INTRAUTERINE COPPER IU by Intrauterine route.    . metroNIDAZOLE (FLAGYL) 500 MG tablet Take 1 tablet (500 mg total) by mouth 2 (two) times daily. 14 tablet 0   No facility-administered medications prior to visit.    PAST MEDICAL HISTORY: Past Medical History  Diagnosis Date  . Ovarian cyst   . Urinary incontinence   . Migraine headache   . Anxiety   . ASD (atrial septal defect), ostium secundum     Status post closure 08/20/10 with Dr. Burt Knack; procedure complicated by right groin hematoma and acute blood loss anemia  . History of TIAs   . Vitamin B 12 deficiency   . H. pylori infection 2012  . HSV-1 (herpes simplex virus 1) infection 2013    Positive antibiotidies -no history of outbreak   . Irregular bleeding 08/03/2012  . Stroke   . IUD (intrauterine device) in place 12/16/2012  . Thyroid enlarged 12/16/2012    History of cyst will get [place]  . Pelvic pain in female 01/19/2014  . Fecal occult blood test positive 01/19/2014    Had positive test, started period today, could be contamination even though gloves changed will send 3 hemoccult cards home with her    PAST SURGICAL HISTORY: Past Surgical History  Procedure Laterality Date  . Thyroid cyst rem    . Tonsillectomy    . Foot neuroma surgery  2009    left  . Thyroid cyst excision    . Asd repair  08/20/2010  . Cardiac surgery      FAMILY HISTORY: Family History  Problem Relation Age of Onset  . Hypertension Father 16  . Stroke Father   . Heart attack Father   . Brain cancer Father   . Heart disease Father   . Diabetes Father   . Liver disease Mother 10    dead  . Cancer Mother     breast  . Hypertension Brother 38  . Cancer Paternal  Grandmother     breast  . Dementia Maternal Grandmother   . Cancer Maternal Grandmother     thyroid  . Cancer Paternal Aunt     breast   . Kidney failure Daughter   . Cancer Other     mom's grandma-breast    SOCIAL HISTORY: History   Social History  . Marital Status: Married    Spouse Name: N/A  . Number of Children: 2  . Years of Education: College   Occupational History  . 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 Smoking status: Never Smoker   . Smokeless tobacco: Never Used  . Alcohol Use: 0.0 oz/week    0 Standard drinks or equivalent per week     Comment: very rarely  . Drug Use: No  . Sexual Activity: Yes    Birth Control/ Protection: IUD  Other Topics Concern  . Not on file   Social History Narrative   Patient lives at home with her family.   Caffeine Use: rarely      PHYSICAL EXAM  Filed Vitals:   04/13/14 0902  BP: 99/70  Pulse: 75  Height: 5' 3"  (1.6 m)  Weight: 141 lb (63.957 kg)   Body mass index is 24.98 kg/(m^2).  Generalized: Well developed, in no acute distress   Neurological examination  Mentation: Alert oriented to time, place, history taking. Follows all commands speech and language fluent Cranial nerve II-XII: Pupils were equal round reactive to light. Extraocular movements were full, visual field were full on confrontational test. Facial sensation and strength were normal. Uvula tongue midline. Head turning and shoulder shrug  were normal and symmetric. Motor: The motor testing reveals 5 over 5 strength of all 4 extremities. Good symmetric motor tone is noted throughout. Dix-Hallpike never positive rotational nystagmus to the right. Sensory: Sensory testing is intact to soft touch on all 4 extremities. Decreased pinprick in the 4th and 5th toes on the left. No evidence of extinction is noted.  Coordination: Cerebellar testing reveals good finger-nose-finger and heel-to-shin bilaterally.  Gait and station: Gait is  normal. Tandem gait is normal. Romberg is negative. No drift is seen.  Reflexes: Deep tendon reflexes are symmetric and normal bilaterally.    DIAGNOSTIC DATA (LABS, IMAGING, TESTING) - I reviewed patient records, labs, notes, testing and imaging myself where available.  Lab Results  Component Value Date   WBC 5.2 03/24/2013   HGB 14.2 03/24/2013   HCT 42.8 03/24/2013   MCV 89.5 03/24/2013   PLT 258 03/24/2013      Component Value Date/Time   NA 139 03/24/2013 0854   K 4.2 03/24/2013 0854   CL 105 03/24/2013 0854   CO2 27 03/24/2013 0854   GLUCOSE 81 03/24/2013 0854   BUN 10 03/24/2013 0854   CREATININE 0.85 03/24/2013 0854   CREATININE 0.80 04/19/2012 1809   CALCIUM 9.4 03/24/2013 0854   PROT 6.8 03/24/2013 0854   ALBUMIN 4.3 03/24/2013 0854   AST 21 03/24/2013 0854   ALT 16 03/24/2013 0854   ALKPHOS 40 03/24/2013 0854   BILITOT 0.7 03/24/2013 0854   GFRNONAA >90 04/19/2012 1809   GFRAA >90 04/19/2012 1809   Lab Results  Component Value Date   CHOL 142 09/09/2012   HDL 50 09/09/2012   LDLCALC 77 09/09/2012   TRIG 76 09/09/2012   CHOLHDL 2.8 09/09/2012   Lab Results  Component Value Date   HGBA1C  03/13/2010    5.2 (NOTE)                                                                       According to the ADA Clinical Practice Recommendations for 2011, when HbA1c is used as a screening test:   >=6.5%   Diagnostic of Diabetes Mellitus           (if abnormal result  is confirmed)  5.7-6.4%   Increased risk of developing Diabetes Mellitus  References:Diagnosis and Classification of Diabetes Mellitus,Diabetes KGSU,1103,15(XYVOP 1):S62-S69 and Standards of Medical Care in         Diabetes - 2011,Diabetes Care,2011,34  (Suppl 1):S11-S61.   Lab  Results  Component Value Date   FWYOVZCH88 502 02/17/2012   Lab Results  Component Value Date   TSH 1.194 12/18/2012      ASSESSMENT AND PLAN 39 y.o. year old female  has a past medical history of Ovarian cyst; Urinary  incontinence; Migraine headache; Anxiety; ASD (atrial septal defect), ostium secundum; History of TIAs; Vitamin B 12 deficiency; H. pylori infection (2012); HSV-1 (herpes simplex virus 1) infection (2013); Irregular bleeding (08/03/2012); Stroke; IUD (intrauterine device) in place (12/16/2012); Thyroid enlarged (12/16/2012); Pelvic pain in female (01/19/2014); and Fecal occult blood test positive (01/19/2014). here with:  1. Dizziness  Patient returns today complaining of ongoing dizziness. Her recent episode of dizziness sounds consistent with benign positional vertigo. However she has had episodes where positional change was not noted before dizziness occurred. Dix-Hallpike maneuver was positive. She has been to vestibular rehabilitation in the past however she states they did not complete any exercise with her other than giving her a list of exercises to do at home. I will refer the patient to vestibular rehabilitation. I've also put in an order for a second opinion from ENT. If vestibular rehabilitation is not beneficial she can then seek a second opinion from ENT. If her symptoms fail to improve  she'll let us know. Otherwise she will follow-up in 3 months or sooner if needed.   Ward Givens, MSN, NP-C 04/13/2014, 9:29 AM [facility] [address] [place], [place] [address] [phone]  Note: This document was prepared with digital dictation and possible smart phrase technology. Any transcriptional errors that result from this process are unintentional.

## 2014-04-13 NOTE — Patient Instructions (Signed)
Referral sent for vestibular rehab and ENT If your dizziness does not improve let us know.

## 2014-04-18 NOTE — Progress Notes (Signed)
I agree with the above plan 

## 2014-04-19 ENCOUNTER — Telehealth: Payer: Self-pay | Admitting: Adult Health

## 2014-04-19 NOTE — Telephone Encounter (Signed)
Received notification that ENT has called the patient twice to schedule and has not received a call back.

## 2014-04-26 ENCOUNTER — Ambulatory Visit (HOSPITAL_COMMUNITY): Payer: BLUE CROSS/BLUE SHIELD | Attending: Adult Health | Admitting: Physical Therapy

## 2014-04-28 ENCOUNTER — Ambulatory Visit (HOSPITAL_COMMUNITY): Payer: BLUE CROSS/BLUE SHIELD | Admitting: Physical Therapy

## 2014-05-03 ENCOUNTER — Ambulatory Visit (HOSPITAL_COMMUNITY): Payer: BLUE CROSS/BLUE SHIELD | Admitting: Physical Therapy

## 2014-05-04 ENCOUNTER — Ambulatory Visit (HOSPITAL_COMMUNITY): Payer: BLUE CROSS/BLUE SHIELD | Admitting: Physical Therapy

## 2014-05-05 ENCOUNTER — Ambulatory Visit (HOSPITAL_COMMUNITY): Payer: BLUE CROSS/BLUE SHIELD | Admitting: Physical Therapy

## 2014-05-10 ENCOUNTER — Ambulatory Visit (HOSPITAL_COMMUNITY): Payer: BLUE CROSS/BLUE SHIELD | Admitting: Physical Therapy

## 2014-05-11 ENCOUNTER — Ambulatory Visit (HOSPITAL_COMMUNITY): Payer: BLUE CROSS/BLUE SHIELD

## 2014-05-12 ENCOUNTER — Ambulatory Visit (HOSPITAL_COMMUNITY): Payer: BLUE CROSS/BLUE SHIELD | Admitting: Physical Therapy

## 2014-05-17 ENCOUNTER — Ambulatory Visit (HOSPITAL_COMMUNITY): Payer: BLUE CROSS/BLUE SHIELD | Admitting: Physical Therapy

## 2014-05-19 ENCOUNTER — Ambulatory Visit (HOSPITAL_COMMUNITY): Payer: BLUE CROSS/BLUE SHIELD | Admitting: Physical Therapy

## 2014-06-20 ENCOUNTER — Other Ambulatory Visit: Payer: Self-pay | Admitting: *Deleted

## 2014-06-20 ENCOUNTER — Other Ambulatory Visit: Payer: Self-pay

## 2014-06-20 DIAGNOSIS — N6310 Unspecified lump in the right breast, unspecified quadrant: Secondary | ICD-10-CM

## 2014-06-20 MED ORDER — BUPROPION HCL ER (SR) 150 MG PO TB12: 150.0000 mg | ORAL_TABLET | Freq: Every day | ORAL | Status: DC

## 2014-06-22 ENCOUNTER — Ambulatory Visit (INDEPENDENT_AMBULATORY_CARE_PROVIDER_SITE_OTHER): Payer: BLUE CROSS/BLUE SHIELD | Admitting: Family Medicine

## 2014-06-22 ENCOUNTER — Encounter: Payer: Self-pay | Admitting: Family Medicine

## 2014-06-22 VITALS — BP 118/64 | HR 70 | Temp 98.2°F | Resp 16 | Ht 63.0 in | Wt 140.0 lb

## 2014-06-22 DIAGNOSIS — N63 Unspecified lump in unspecified breast: Secondary | ICD-10-CM

## 2014-06-22 NOTE — Patient Instructions (Signed)
Mammogram to be set up  F/U as needed

## 2014-06-22 NOTE — Progress Notes (Signed)
Patient ID: Candice Estrada, female   DOB: 12-27-1975, 39 y.o.   MRN: [id number]   Subjective:    Patient ID: Candice Estrada, female    DOB: 1975/03/25, 39 y.o.   MRN: [id number]  Patient presents for L Breast Lump  patient here with a knot in her left breast. She noticed it a few weeks ago. She has fiber dense breasts and has had cysts in the past which they've been monitoring. Her last mammogram was in December 2015 was normal at that time. She denies any pain or tenderness and has not noticed any change in the size. She denies any nipple drainage or bloody discharge. Of note she is also still being followed by neurology and cardiology for her dizzy episodes. They're planning to place a loop recorder    Review Of Systems:  GEN- denies fatigue, fever, weight loss,weakness, recent illness HEENT- denies eye drainage, change in vision, nasal discharge, CVS- denies chest pain, palpitations RESP- denies SOB, cough, wheeze ABD- denies N/V, change in stools, abd pain GU- denies dysuria, hematuria, dribbling, incontinence MSK- denies joint pain, muscle aches, injury Neuro- denies headache, dizziness, syncope, seizure activity       Objective:    BP 118/64 mmHg  Pulse 70  Temp(Src) 98.2 F (36.8 C) (Oral)  Resp 16  Ht 5' 3"  (1.6 m)  Wt 140 lb (63.504 kg)  BMI 24.81 kg/m2  LMP 06/06/2014 (Approximate) GEN- NAD, alert and oriented x3 Breast- normal symmetry, no nipple inversion,no nipple drainage, Right breast 10'oclock position  Pea size nodule palpated, NT, dense breast tissue Nodes- no axillary nodes        Assessment & Plan:      Problem List Items Addressed This Visit    None    Visit Diagnoses    Breast mass    -  Primary    With recurrent cyst and dense breast, requires Diagnostic Mammogram, will refer for this. Definite palpable nodule in breast    Relevant Orders    MM Digital Diagnostic Bilat       Note: This dictation was prepared with Dragon dictation along  with smaller phrase technology. Any transcriptional errors that result from this process are unintentional.

## 2014-06-29 ENCOUNTER — Other Ambulatory Visit: Payer: Self-pay | Admitting: Family Medicine

## 2014-06-29 DIAGNOSIS — N63 Unspecified lump in unspecified breast: Secondary | ICD-10-CM

## 2014-06-30 ENCOUNTER — Ambulatory Visit (INDEPENDENT_AMBULATORY_CARE_PROVIDER_SITE_OTHER): Payer: BLUE CROSS/BLUE SHIELD | Admitting: Internal Medicine

## 2014-06-30 ENCOUNTER — Encounter: Payer: Self-pay | Admitting: Internal Medicine

## 2014-06-30 VITALS — BP 101/71 | HR 84 | Ht 64.0 in | Wt 138.8 lb

## 2014-06-30 DIAGNOSIS — R000 Tachycardia, unspecified: Secondary | ICD-10-CM | POA: Diagnosis not present

## 2014-06-30 DIAGNOSIS — I951 Orthostatic hypotension: Secondary | ICD-10-CM | POA: Diagnosis not present

## 2014-06-30 DIAGNOSIS — G90A Postural orthostatic tachycardia syndrome (POTS): Secondary | ICD-10-CM

## 2014-06-30 DIAGNOSIS — I498 Other specified cardiac arrhythmias: Secondary | ICD-10-CM

## 2014-06-30 LAB — CORTISOL: CORTISOL PLASMA: 10.3 ug/dL

## 2014-06-30 MED ORDER — MECLIZINE HCL 12.5 MG PO TABS: 12.5000 mg | ORAL_TABLET | Freq: Every day | ORAL | Status: DC | PRN

## 2014-06-30 NOTE — Progress Notes (Signed)
Patient Care Team: Alycia Rossetti, MD as PCP - General (Family Medicine)   HPI  Candice Estrada is a 39 y.o. female Are seen in followup for syncope in the context of a prior ASD closure  TILT ws scheduled but not done 2/2 cost;  ?    Echocardiogram was normal. signal average ECG wsa normal   She struggled with exercise tachycardia. This is accompanied by shortness of breath. She describes her volume status as being replete. Her salt intake is modest.  She has violaceous discoloration of her lower extremities with standing.  fQRS HFLA <40 RMS40  96 26 56      Past Medical History  Diagnosis Date  . Ovarian cyst   . Urinary incontinence   . Migraine headache   . Anxiety   . ASD (atrial septal defect), ostium secundum     Status post closure 08/20/10 with Dr. Burt Knack; procedure complicated by right groin hematoma and acute blood loss anemia  . History of TIAs   . Vitamin B 12 deficiency   . H. pylori infection 2012  . HSV-1 (herpes simplex virus 1) infection 2013    Positive antibiotidies -no history of outbreak   . Irregular bleeding 08/03/2012  . Stroke   . IUD (intrauterine device) in place 12/16/2012  . Thyroid enlarged 12/16/2012    History of cyst will get [place]  . Pelvic pain in female 01/19/2014  . Fecal occult blood test positive 01/19/2014    Had positive test, started period today, could be contamination even though gloves changed will send 3 hemoccult cards home with her    Past Surgical History  Procedure Laterality Date  . Thyroid cyst rem    . Tonsillectomy    . Foot neuroma surgery  2009    left  . Thyroid cyst excision    . Asd repair  08/20/2010  . Cardiac surgery      Current Outpatient Prescriptions  Medication Sig Dispense Refill  . aspirin EC 81 MG tablet Take 81 mg by mouth daily.    Marland Kitchen buPROPion (WELLBUTRIN SR) 150 MG 12 hr tablet Take 1 tablet (150 mg total) by mouth daily. 90 tablet 4  . ibuprofen (ADVIL,MOTRIN) 800 MG tablet Take 1  tablet (800 mg total) by mouth every 8 (eight) hours as needed. 30 tablet 2  . meclizine (ANTIVERT) 12.5 MG tablet Take 1 tablet (12.5 mg total) by mouth daily as needed for dizziness. 30 tablet 0  . midodrine (PROAMATINE) 2.5 MG tablet Take 1 tablet (2.5 mg total) by mouth 2 (two) times daily with a meal. 60 tablet 3  . Multiple Vitamin (MULTIVITAMIN WITH MINERALS) TABS Take 1 tablet by mouth daily.    Marland Kitchen PARAGARD INTRAUTERINE COPPER IU by Intrauterine route.     No current facility-administered medications for this visit.    No Known Allergies  Review of Systems negative except from HPI and PMH  Physical Exam BP 101/71 mmHg  Pulse 84  Ht 5' 4"  (1.626 m)  Wt 138 lb 12.8 oz (62.959 kg)  BMI 23.81 kg/m2  LMP 06/06/2014 (Approximate) Well developed and well nourished in no acute distress HENT normal E scleral and icterus clear Neck Supple JVP flat; carotids brisk and full Clear to ausculation regular rate and rhythm, no murmurs gallops or rub Soft with active bowel sounds No clubbing cyanosis  Edema Alert and oriented, grossly normal motor and sensory function Skin Warm and Dry    Assessment and  Plan  Dizziness question related to blood pressure  VT NS noted on ZIO patch 6/15  ASD repair  POTS ? Vertigo     I am not quite sure what are the mechanism of her spells. They sound to be a rhythmic. We have noted nonsustained ventricular tachycardia on monitoring; the duration of her spells he is tender seconds suggesting that it might be a different mechanism or perhaps a more prolonged episode. We will use AliveCor monitor to try to detect. We've also discussed the possible use of an implantable loop recorder.  Treatment options would include antiarrhythmics therapy as her blood pressure would not tolerate the use of beta blockers and/or calcium blockers.  With her low blood pressure we will check a random cortisol level. We will increase her ProAmatine from 2.5 twice a  day--5 3 times a day. She can take it on an as-needed basis when her blood pressures are particularly low, i.e. symptomatic often less than 80-85.    We spent more than 50% of our >25  min visit in face to face counseling regarding the above

## 2014-06-30 NOTE — Patient Instructions (Addendum)
Medication Instructions:  Your physician recommends that you continue on your current medications as directed. Please refer to the Current Medication list given to you today.   Labwork: Your physician recommends that you return for lab work TODAY (Cortisol Level)   Testing/Procedures: NONE  Follow-Up: Your physician recommends that you schedule a follow-up appointment in: 8 weeks with Dr. Caryl Comes.    Any Other Special Instructions Will Be Listed Below (If Applicable).  Dr. Caryl Comes recommends that you purchase an AliveCor Device Monitor. [email or web address]

## 2014-07-06 ENCOUNTER — Other Ambulatory Visit: Payer: BLUE CROSS/BLUE SHIELD

## 2014-07-06 ENCOUNTER — Ambulatory Visit (HOSPITAL_COMMUNITY)
Admission: RE | Admit: 2014-07-06 | Discharge: 2014-07-06 | Disposition: A | Discharge status: Home / Self Care | Payer: BLUE CROSS/BLUE SHIELD | Source: Ambulatory Visit | Attending: Physician Assistant | Admitting: Physician Assistant

## 2014-07-06 ENCOUNTER — Ambulatory Visit: Payer: BLUE CROSS/BLUE SHIELD | Admitting: Adult Health

## 2014-07-06 ENCOUNTER — Ambulatory Visit (INDEPENDENT_AMBULATORY_CARE_PROVIDER_SITE_OTHER): Payer: BLUE CROSS/BLUE SHIELD | Admitting: Physician Assistant

## 2014-07-06 ENCOUNTER — Encounter: Payer: Self-pay | Admitting: Physician Assistant

## 2014-07-06 VITALS — BP 90/60 | HR 80 | Temp 98.3°F | Resp 20 | Wt 141.0 lb

## 2014-07-06 DIAGNOSIS — K5900 Constipation, unspecified: Secondary | ICD-10-CM

## 2014-07-07 LAB — CBC WITH DIFFERENTIAL/PLATELET
BASOS PCT: 1 % (ref 0–1)
Basophils Absolute: 0.1 10*3/uL (ref 0.0–0.1)
EOS ABS: 0.1 10*3/uL (ref 0.0–0.7)
Eosinophils Relative: 2 % (ref 0–5)
HCT: 41.2 % (ref 36.0–46.0)
HEMOGLOBIN: 13.7 g/dL (ref 12.0–15.0)
LYMPHS ABS: 1.7 10*3/uL (ref 0.7–4.0)
Lymphocytes Relative: 27 % (ref 12–46)
MCH: 30 pg (ref 26.0–34.0)
MCHC: 33.3 g/dL (ref 30.0–36.0)
MCV: 90.2 fL (ref 78.0–100.0)
MPV: 9 fL (ref 8.6–12.4)
Monocytes Absolute: 0.5 10*3/uL (ref 0.1–1.0)
Monocytes Relative: 8 % (ref 3–12)
NEUTROS ABS: 4 10*3/uL (ref 1.7–7.7)
NEUTROS PCT: 62 % (ref 43–77)
Platelets: 249 10*3/uL (ref 150–400)
RBC: 4.57 MIL/uL (ref 3.87–5.11)
RDW: 13.3 % (ref 11.5–15.5)
WBC: 6.4 10*3/uL (ref 4.0–10.5)

## 2014-07-07 LAB — COMPLETE METABOLIC PANEL WITH GFR
ALBUMIN: 4.3 g/dL (ref 3.5–5.2)
ALT: 10 U/L (ref 0–35)
AST: 15 U/L (ref 0–37)
Alkaline Phosphatase: 41 U/L (ref 39–117)
BUN: 10 mg/dL (ref 6–23)
CALCIUM: 9.1 mg/dL (ref 8.4–10.5)
CO2: 24 meq/L (ref 19–32)
Chloride: 107 mEq/L (ref 96–112)
Creat: 0.83 mg/dL (ref 0.50–1.10)
GFR, EST NON AFRICAN AMERICAN: 88 mL/min
GLUCOSE: 82 mg/dL (ref 70–99)
POTASSIUM: 3.9 meq/L (ref 3.5–5.3)
Sodium: 139 mEq/L (ref 135–145)
TOTAL PROTEIN: 6.8 g/dL (ref 6.0–8.3)
Total Bilirubin: 0.4 mg/dL (ref 0.2–1.2)

## 2014-07-07 LAB — TSH: TSH: 0.952 u[IU]/mL (ref 0.350–4.500)

## 2014-07-07 NOTE — Progress Notes (Signed)
Patient ID: Candice Estrada MRN: [id number], DOB: 29-Nov-1975, 39 y.o. Date of Encounter: @DATE @  Chief Complaint:  Chief Complaint  Patient presents with  . Constipation    has not had bowel movement in 3 weeks, has tried everything possible to help with BM and and still has not had one, takes OTC but only makes her stomach hurt, no nausea/ vomiting, does not feel bloated    HPI: 39 y.o. year old white female  presents with above symptoms.  She says that usually she has no problems with her bowel movements. Visit usually she does not have diarrhea or constipation. Says that she usually can just go to the bathroom without getting any thought.  However says that she has had no bowel movement for a couple weeks now. Says that she drinks coffee every morning and that usually this will cause her to have a BM but it has not. Has also drank some prune juice. Has used Correctol. Has used some Amitiza--- she works at a [employer] office so had access to some samples. Has eaten cabbage and broccoli and salad.  Says that she is having no abdominal pain. Visit even when eating a lot of food she still is not having abdominal pain and really doesn't even feel real bloated or full or back up ". Says that Friday night she went to [employer] and last night she ate hot wings says that she is eating with no abdominal pain or other symptoms.  She's even been doing sit-ups and walking-- thinking that this would help get things moving.  Has had no fevers or chills and no abdominal pain. No other concerns or complaints.  Has had no surgeries of her abdomen or pelvis. No GYN surgeries or abdominal surgeries.   Past Medical History  Diagnosis Date  . Ovarian cyst   . Urinary incontinence   . Migraine headache   . Anxiety   . ASD (atrial septal defect), ostium secundum     Status post closure 08/20/10 with Dr. Burt Knack; procedure complicated by right groin hematoma and acute blood loss anemia  . History  of TIAs   . Vitamin B 12 deficiency   . H. pylori infection 2012  . HSV-1 (herpes simplex virus 1) infection 2013    Positive antibiotidies -no history of outbreak   . Irregular bleeding 08/03/2012  . Stroke   . IUD (intrauterine device) in place 12/16/2012  . Thyroid enlarged 12/16/2012    History of cyst will get [place]  . Pelvic pain in female 01/19/2014  . Fecal occult blood test positive 01/19/2014    Had positive test, started period today, could be contamination even though gloves changed will send 3 hemoccult cards home with her     Home Meds: Outpatient Prescriptions Prior to Visit  Medication Sig Dispense Refill  . aspirin EC 81 MG tablet Take 81 mg by mouth daily.    Marland Kitchen buPROPion (WELLBUTRIN SR) 150 MG 12 hr tablet Take 1 tablet (150 mg total) by mouth daily. 90 tablet 4  . ibuprofen (ADVIL,MOTRIN) 800 MG tablet Take 1 tablet (800 mg total) by mouth every 8 (eight) hours as needed. 30 tablet 2  . meclizine (ANTIVERT) 12.5 MG tablet Take 1 tablet (12.5 mg total) by mouth daily as needed for dizziness. 30 tablet 0  . midodrine (PROAMATINE) 2.5 MG tablet Take 1 tablet (2.5 mg total) by mouth 2 (two) times daily with a meal. 60 tablet 3  . Multiple Vitamin (MULTIVITAMIN WITH  MINERALS) TABS Take 1 tablet by mouth daily.    Marland Kitchen PARAGARD INTRAUTERINE COPPER IU by Intrauterine route.     No facility-administered medications prior to visit.    Allergies: No Known Allergies  History   Social History  . Marital Status: Married    Spouse Name: N/A  . Number of Children: 2  . Years of Education: College   Occupational History  . 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 Smoking status: Never Smoker   . Smokeless tobacco: Never Used  . Alcohol Use: 0.0 oz/week    0 Standard drinks or equivalent per week     Comment: very rarely  . Drug Use: No  . Sexual Activity: Yes    Birth Control/ Protection: IUD   Other Topics Concern  . Not on file   Social History  Narrative   Patient lives at home with her family.   Caffeine Use: rarely    Family History  Problem Relation Age of Onset  . Hypertension Father 5  . Stroke Father   . Heart attack Father   . Brain cancer Father   . Heart disease Father   . Diabetes Father   . Liver disease Mother 58    dead  . Cancer Mother     breast  . Hypertension Brother 32  . Cancer Paternal Grandmother     breast  . Dementia Maternal Grandmother   . Cancer Maternal Grandmother     thyroid  . Cancer Paternal Aunt     breast   . Kidney failure Daughter   . Cancer Other     mom's grandma-breast     Review of Systems:  See HPI for pertinent ROS. All other ROS negative.    Physical Exam: Blood pressure 90/60, pulse 80, temperature 98.3 F (36.8 C), temperature source Oral, resp. rate 20, weight 141 lb (63.957 kg), last menstrual period 06/15/2014., Body mass index is 24.19 kg/(m^2). General: WNWD WF. Appears in no acute distress. Neck: Supple. No thyromegaly. No lymphadenopathy. Lungs: Clear bilaterally to auscultation without wheezes, rales, or rhonchi. Breathing is unlabored. Heart: RRR with S1 S2. No murmurs, rubs, or gallops. Abdomen: Soft, non-tender, non-distended with normoactive bowel sounds. No hepatomegaly. No rebound/guarding. No obvious abdominal masses. NO area of tnederness with palpation.  Musculoskeletal:  Strength and tone normal for age. Extremities/Skin: Warm and dry. Neuro: Alert and oriented X 3. Moves all extremities spontaneously. Gait is normal. CNII-XII grossly in tact. Psych:  Responds to questions appropriately with a normal affect.     ASSESSMENT AND PLAN:  39 y.o. year old female with  1. Constipation, unspecified constipation type - DG Abd 2 Views; Future - CBC with Differential/Platelet - COMPLETE METABOLIC PANEL WITH GFR - TSH  Will check labs. Will get abdominal x-ray--- she is to leave the office and go immediately to x-ray now. She is informed not to  leave the x-ray department until she has spoken to me to get results. Will give her further instructions at that time.  [address] [place], [place], [facility] 07/07/2014 5:47 PM

## 2014-07-08 ENCOUNTER — Ambulatory Visit
Admission: RE | Admit: 2014-07-08 | Discharge: 2014-07-08 | Disposition: A | Discharge status: Home / Self Care | Payer: BLUE CROSS/BLUE SHIELD | Source: Ambulatory Visit | Attending: Family Medicine | Admitting: Family Medicine

## 2014-07-08 DIAGNOSIS — N63 Unspecified lump in unspecified breast: Secondary | ICD-10-CM

## 2014-07-13 ENCOUNTER — Telehealth: Payer: Self-pay | Admitting: *Deleted

## 2014-07-13 ENCOUNTER — Other Ambulatory Visit: Payer: Self-pay | Admitting: *Deleted

## 2014-07-13 ENCOUNTER — Telehealth: Payer: Self-pay | Admitting: Family Medicine

## 2014-07-13 MED ORDER — IBUPROFEN 800 MG PO TABS: 800.0000 mg | ORAL_TABLET | Freq: Three times a day (TID) | ORAL | Status: DC | PRN

## 2014-07-13 MED ORDER — MECLIZINE HCL 12.5 MG PO TABS: 12.5000 mg | ORAL_TABLET | Freq: Every day | ORAL | Status: DC | PRN

## 2014-07-13 NOTE — Telephone Encounter (Signed)
OK to send to local pharmacy.  Thanks [facility]

## 2014-07-13 NOTE — Telephone Encounter (Signed)
An rx for meclizine was sent to the patients mail order pharmacy at her last ov. She is now requesting one be sent to her local pharmacy as she stated that the mail order pharmacy wont deliver it to her. Ok to send? Please advise. Thanks, MI

## 2014-07-13 NOTE — Telephone Encounter (Signed)
Prescription sent to pharmacy.

## 2014-07-13 NOTE — Telephone Encounter (Signed)
Patient would like rx for her ibuprofen 800 mg [facility]  90 day supply

## 2014-07-13 NOTE — Telephone Encounter (Signed)
Ok to refill 

## 2014-07-13 NOTE — Telephone Encounter (Signed)
ok 

## 2014-07-19 ENCOUNTER — Encounter: Payer: Self-pay | Admitting: Family Medicine

## 2014-07-19 ENCOUNTER — Ambulatory Visit (INDEPENDENT_AMBULATORY_CARE_PROVIDER_SITE_OTHER): Payer: BLUE CROSS/BLUE SHIELD | Admitting: Family Medicine

## 2014-07-19 VITALS — BP 122/64 | HR 76 | Temp 98.1°F | Resp 14 | Ht 64.0 in | Wt 138.0 lb

## 2014-07-19 DIAGNOSIS — R1012 Left upper quadrant pain: Secondary | ICD-10-CM

## 2014-07-19 DIAGNOSIS — K5900 Constipation, unspecified: Secondary | ICD-10-CM

## 2014-07-19 LAB — URINALYSIS, ROUTINE W REFLEX MICROSCOPIC
Bilirubin Urine: NEGATIVE
GLUCOSE, UA: NEGATIVE mg/dL
KETONES UR: NEGATIVE mg/dL
Nitrite: NEGATIVE
Protein, ur: NEGATIVE mg/dL
Specific Gravity, Urine: 1.025 (ref 1.005–1.030)
Urobilinogen, UA: 0.2 mg/dL (ref 0.0–1.0)
pH: 6.5 (ref 5.0–8.0)

## 2014-07-19 LAB — PREGNANCY, URINE: Preg Test, Ur: NEGATIVE

## 2014-07-19 LAB — URINALYSIS, MICROSCOPIC ONLY
Casts: NONE SEEN
Crystals: NONE SEEN

## 2014-07-19 NOTE — Patient Instructions (Addendum)
Continue with the laxative, either Miralax twice a day or magnesium  Do this for the new few days, see if pain goes away, if not, call and CT of abdomen/Pelvis will be done F/U as needed

## 2014-07-19 NOTE — Progress Notes (Signed)
Patient ID: Candice Estrada, female   DOB: 1975/12/19, 39 y.o.   MRN: [id number]   Subjective:    Patient ID: Candice Estrada, female    DOB: 03-16-1975, 39 y.o.   MRN: [id number]  Patient presents for L Side Pain  patient here with left side pain. She was seen on June 22 at that time had had constipation which she not had a bowel movement in days but she was not having any significant pain. She tried [place] lax after she tried some other over-the-counter medicines she also tried Linzess. She also tried magnesium citrate and was finally able to have good bowel movements. Her bowels have been normal since then. Yesterday after eating at the cookout she has severe left upper quadrant pain but states that she has had a funny feeling in this area on and off. She denies any burning with urination denies any vomiting but he did have nausea denies any fever. She took a Zofran for the nausea. She still has not eating due to decreased appetite. She denies any new symptoms otherwise. She did have labs that were done a week and a half ago which were normal and she had a KUB done which showed severe constipation  IUD for birth control     Review Of Systems:  GEN- denies fatigue, fever, weight loss,weakness, recent illness HEENT- denies eye drainage, change in vision, nasal discharge, CVS- denies chest pain, palpitations RESP- denies SOB, cough, wheeze ABD- denies N/V, change in stools, +abd pain GU- denies dysuria, hematuria, dribbling, incontinence MSK- denies joint pain, muscle aches, injury Neuro- denies headache, dizziness, syncope, seizure activity       Objective:    BP 122/64 mmHg  Pulse 76  Temp(Src) 98.1 F (36.7 C) (Oral)  Resp 14  Ht 5' 4"  (1.626 m)  Wt 138 lb (62.596 kg)  BMI 23.68 kg/m2  LMP 07/11/2014 GEN- NAD, alert and oriented x3 HEENT- PERRL, EOMI, non injected sclera, pink conjunctiva, MMM, oropharynx clear CVS- RRR, no murmur RESP-CTAB ABD-NABS,soft,mild TTP epigastric  and LUQ pain, no mass palpated, no CVA tenderness, no rebound  EXT- No edema Pulses- Radial,  2+  UA- unremarkable , U preg Neg      Assessment & Plan:      Problem List Items Addressed This Visit    None    Visit Diagnoses    LUQ pain    -  Primary    No sign of UTI or pyleonehpriotis, pain not classic of kidney stones, no Spleen injury, will work out complete bowel cleanout first, if not improved, CT abd pelvis, avoid fried/heavy foods, no other sign of gallbladder disease    Relevant Orders    Urinalysis, Routine w reflex microscopic (not at [facility]) (Completed)    Pregnancy, urine (Completed)    Constipation, unspecified constipation type           Note: This dictation was prepared with Dragon dictation along with smaller phrase technology. Any transcriptional errors that result from this process are unintentional.

## 2014-07-21 ENCOUNTER — Ambulatory Visit: Payer: Self-pay | Admitting: Physician Assistant

## 2014-07-21 ENCOUNTER — Telehealth: Payer: Self-pay | Admitting: Family Medicine

## 2014-07-21 DIAGNOSIS — R1012 Left upper quadrant pain: Secondary | ICD-10-CM

## 2014-07-21 DIAGNOSIS — R319 Hematuria, unspecified: Secondary | ICD-10-CM

## 2014-07-21 NOTE — Telephone Encounter (Signed)
Patient would like to know since she isn't getting any better if Dr. Buelah Manis will just order the CT that they discussed on July 19, 2014. Please advise.

## 2014-07-21 NOTE — Telephone Encounter (Signed)
Received call from patient this morning.   Stated that she continued to have L sided pain. Reported that she was now experiencing blood in urine as well.   Advised to come in for OV and appointment scheduled with PA on 07/21/2014.   Patient returned call and cancelled appointment and requested to have CT scan ordered.   MD please advise.

## 2014-07-22 NOTE — Telephone Encounter (Signed)
Send for CT abd/pelvis- abd pain and hematuria,

## 2014-07-22 NOTE — Telephone Encounter (Signed)
Scan ordered.   Call placed to patient. Candice Estrada.

## 2014-07-25 ENCOUNTER — Ambulatory Visit (HOSPITAL_COMMUNITY)
Admission: RE | Admit: 2014-07-25 | Discharge: 2014-07-25 | Disposition: A | Discharge status: Home / Self Care | Payer: BLUE CROSS/BLUE SHIELD | Source: Ambulatory Visit | Attending: Family Medicine | Admitting: Family Medicine

## 2014-07-25 ENCOUNTER — Other Ambulatory Visit: Payer: Self-pay | Admitting: Family Medicine

## 2014-07-25 DIAGNOSIS — R1012 Left upper quadrant pain: Secondary | ICD-10-CM | POA: Diagnosis present

## 2014-07-25 DIAGNOSIS — R319 Hematuria, unspecified: Secondary | ICD-10-CM

## 2014-07-25 MED ORDER — IOHEXOL 300 MG/ML  SOLN
100.0000 mL | Freq: Once | INTRAMUSCULAR | Status: AC | PRN
  Administered 2014-07-25: 100 mL via INTRAVENOUS

## 2014-07-25 NOTE — Telephone Encounter (Signed)
Noted that MRI is scheduled.   Message to be closed.

## 2014-07-26 ENCOUNTER — Other Ambulatory Visit (HOSPITAL_COMMUNITY): Payer: BLUE CROSS/BLUE SHIELD

## 2014-07-26 NOTE — Telephone Encounter (Signed)
Refill appropriate and filled per protocol. 

## 2014-07-27 ENCOUNTER — Other Ambulatory Visit: Payer: Self-pay | Admitting: *Deleted

## 2014-07-27 DIAGNOSIS — N2889 Other specified disorders of kidney and ureter: Secondary | ICD-10-CM

## 2014-07-29 ENCOUNTER — Other Ambulatory Visit: Payer: Self-pay | Admitting: Family Medicine

## 2014-08-08 ENCOUNTER — Ambulatory Visit (HOSPITAL_COMMUNITY)
Admission: RE | Admit: 2014-08-08 | Discharge: 2014-08-08 | Disposition: A | Discharge status: Home / Self Care | Payer: BLUE CROSS/BLUE SHIELD | Source: Ambulatory Visit | Attending: Family Medicine | Admitting: Family Medicine

## 2014-08-08 DIAGNOSIS — N2889 Other specified disorders of kidney and ureter: Secondary | ICD-10-CM | POA: Diagnosis present

## 2014-08-08 MED ORDER — GADOBENATE DIMEGLUMINE 529 MG/ML IV SOLN
10.0000 mL | Freq: Once | INTRAVENOUS | Status: AC | PRN
  Administered 2014-08-08: 10 mL via INTRAVENOUS

## 2014-08-12 ENCOUNTER — Other Ambulatory Visit: Payer: Self-pay | Admitting: Urology

## 2014-08-12 ENCOUNTER — Telehealth: Payer: Self-pay | Admitting: Internal Medicine

## 2014-08-12 NOTE — Telephone Encounter (Signed)
error 

## 2014-08-19 ENCOUNTER — Other Ambulatory Visit: Payer: Self-pay | Admitting: Urology

## 2014-08-22 ENCOUNTER — Encounter (HOSPITAL_COMMUNITY): Payer: Self-pay

## 2014-08-22 NOTE — Patient Instructions (Addendum)
YOUR PROCEDURE IS SCHEDULED ON :  08/31/14  REPORT TO [facility] MAIN ENTRANCE FOLLOW SIGNS TO EAST ELEVATOR - GO TO 3rd FLOOR CHECK IN AT 3 EAST NURSES STATION (SHORT STAY) AT:  8:00 AM  CALL THIS NUMBER IF YOU HAVE PROBLEMS THE MORNING OF SURGERY [phone]  REMEMBER:ONLY 1 PER PERSON MAY GO TO SHORT STAY WITH YOU TO GET READY THE MORNING OF YOUR SURGERY  DO NOT EAT FOOD OR DRINK LIQUIDS AFTER MIDNIGHT  TAKE THESE MEDICINES THE MORNING OF SURGERY: WELLBUTRIN  YOU MAY NOT HAVE ANY METAL ON YOUR BODY INCLUDING HAIR PINS AND PIERCING'S. DO NOT WEAR JEWELRY, MAKEUP, LOTIONS, POWDERS OR PERFUMES. DO NOT WEAR NAIL POLISH. DO NOT SHAVE 48 HRS PRIOR TO SURGERY. MEN MAY SHAVE FACE AND NECK.  DO NOT [place]. [place] IS NOT RESPONSIBLE FOR VALUABLES.  CONTACTS, DENTURES OR PARTIALS MAY NOT BE WORN TO SURGERY. LEAVE SUITCASE IN CAR. CAN BE BROUGHT TO ROOM AFTER SURGERY.  PATIENTS DISCHARGED THE DAY OF SURGERY WILL NOT BE ALLOWED TO DRIVE HOME.  PLEASE READ OVER THE FOLLOWING INSTRUCTION SHEETS _________________________________________________________________________________                                          [place] - PREPARING FOR SURGERY  Before surgery, you can play an important role.  Because skin is not sterile, your skin needs to be as free of germs as possible.  You can reduce the number of germs on your skin by washing with CHG (chlorahexidine gluconate) soap before surgery.  CHG is an antiseptic cleaner which kills germs and bonds with the skin to continue killing germs even after washing. Please DO NOT use if you have an allergy to CHG or antibacterial soaps.  If your skin becomes reddened/irritated stop using the CHG and inform your nurse when you arrive at Short Stay. Do not shave (including legs and underarms) for at least 48 hours prior to the first CHG shower.  You may shave your face. Please follow these instructions  carefully:   1.  Shower with CHG Soap the night before surgery and the  morning of Surgery.   2.  If you choose to wash your hair, wash your hair first as usual with your  normal  Shampoo.   3.  After you shampoo, rinse your hair and body thoroughly to remove the  shampoo.                                         4.  Use CHG as you would any other liquid soap.  You can apply chg directly  to the skin and wash . Gently wash with scrungie or clean wascloth    5.  Apply the CHG Soap to your body ONLY FROM THE NECK DOWN.   Do not use on open                           Wound or open sores. Avoid contact with eyes, ears mouth and genitals (private parts).                        Genitals (private parts) with your normal soap.  6.  Wash thoroughly, paying special attention to the area where your surgery  will be performed.   7.  Thoroughly rinse your body with warm water from the neck down.   8.  DO NOT shower/wash with your normal soap after using and rinsing off  the CHG Soap .                9.  Pat yourself dry with a clean towel.             10.  Wear clean night clothes to bed after shower             11.  Place clean sheets on your bed the night of your first shower and do not  sleep with pets.  Day of Surgery : Do not apply any lotions/deodorants the morning of surgery.  Please wear clean clothes to the hospital/surgery center.  FAILURE TO FOLLOW THESE INSTRUCTIONS MAY RESULT IN THE CANCELLATION OF YOUR SURGERY    PATIENT SIGNATURE_________________________________  ______________________________________________________________________

## 2014-08-23 ENCOUNTER — Encounter (HOSPITAL_COMMUNITY): Payer: Self-pay

## 2014-08-23 ENCOUNTER — Encounter (HOSPITAL_COMMUNITY)
Admission: RE | Admit: 2014-08-23 | Discharge: 2014-08-23 | Disposition: A | Discharge status: Home / Self Care | Payer: BLUE CROSS/BLUE SHIELD | Source: Ambulatory Visit | Attending: Urology | Admitting: Urology

## 2014-08-23 DIAGNOSIS — R310 Gross hematuria: Secondary | ICD-10-CM | POA: Insufficient documentation

## 2014-08-23 DIAGNOSIS — Z01818 Encounter for other preprocedural examination: Secondary | ICD-10-CM | POA: Insufficient documentation

## 2014-08-23 DIAGNOSIS — N200 Calculus of kidney: Secondary | ICD-10-CM | POA: Insufficient documentation

## 2014-08-23 HISTORY — DX: Hypotension, unspecified: I95.9

## 2014-08-23 HISTORY — DX: Sleep terrors (night terrors): F51.4

## 2014-08-23 HISTORY — DX: Other complications of anesthesia, initial encounter: T88.59XA

## 2014-08-23 HISTORY — DX: Hematuria, unspecified: R31.9

## 2014-08-23 HISTORY — DX: Personal history of other specified conditions: Z87.898

## 2014-08-23 HISTORY — DX: Adverse effect of unspecified anesthetic, initial encounter: T41.45XA

## 2014-08-23 HISTORY — DX: Personal history of diseases of the skin and subcutaneous tissue: Z87.2

## 2014-08-23 HISTORY — DX: Calculus of kidney: N20.0

## 2014-08-23 LAB — CBC
HCT: 43.2 % (ref 36.0–46.0)
HEMOGLOBIN: 14.2 g/dL (ref 12.0–15.0)
MCH: 29.8 pg (ref 26.0–34.0)
MCHC: 32.9 g/dL (ref 30.0–36.0)
MCV: 90.8 fL (ref 78.0–100.0)
Platelets: 239 10*3/uL (ref 150–400)
RBC: 4.76 MIL/uL (ref 3.87–5.11)
RDW: 12.6 % (ref 11.5–15.5)
WBC: 4.8 10*3/uL (ref 4.0–10.5)

## 2014-08-23 LAB — BASIC METABOLIC PANEL
Anion gap: 7 (ref 5–15)
BUN: 9 mg/dL (ref 6–20)
CHLORIDE: 106 mmol/L (ref 101–111)
CO2: 27 mmol/L (ref 22–32)
Calcium: 9.5 mg/dL (ref 8.9–10.3)
Creatinine, Ser: 0.9 mg/dL (ref 0.44–1.00)
GFR calc non Af Amer: >60 mL/min (ref >60)
Glucose, Bld: 92 mg/dL (ref 65–99)
POTASSIUM: 4 mmol/L (ref 3.5–5.1)
SODIUM: 140 mmol/L (ref 135–145)

## 2014-08-23 LAB — HCG, SERUM, QUALITATIVE: PREG SERUM: NEGATIVE

## 2014-08-30 ENCOUNTER — Ambulatory Visit: Payer: BLUE CROSS/BLUE SHIELD | Admitting: Internal Medicine

## 2014-08-31 ENCOUNTER — Encounter (HOSPITAL_COMMUNITY): Payer: Self-pay | Admitting: *Deleted

## 2014-08-31 ENCOUNTER — Ambulatory Visit (HOSPITAL_COMMUNITY)
Admission: RE | Admit: 2014-08-31 | Discharge: 2014-08-31 | Disposition: A | Discharge status: Home / Self Care | Payer: BLUE CROSS/BLUE SHIELD | Source: Ambulatory Visit | Attending: Urology | Admitting: Urology

## 2014-08-31 ENCOUNTER — Ambulatory Visit (HOSPITAL_COMMUNITY): Payer: BLUE CROSS/BLUE SHIELD

## 2014-08-31 ENCOUNTER — Encounter (HOSPITAL_COMMUNITY)
Admission: RE | Disposition: A | Discharge status: Home / Self Care | Payer: Self-pay | Source: Ambulatory Visit | Attending: Urology

## 2014-08-31 DIAGNOSIS — Z791 Long term (current) use of non-steroidal anti-inflammatories (NSAID): Secondary | ICD-10-CM | POA: Insufficient documentation

## 2014-08-31 DIAGNOSIS — I4891 Unspecified atrial fibrillation: Secondary | ICD-10-CM | POA: Insufficient documentation

## 2014-08-31 DIAGNOSIS — I69354 Hemiplegia and hemiparesis following cerebral infarction affecting left non-dominant side: Secondary | ICD-10-CM | POA: Diagnosis not present

## 2014-08-31 DIAGNOSIS — R109 Unspecified abdominal pain: Secondary | ICD-10-CM | POA: Diagnosis present

## 2014-08-31 DIAGNOSIS — N200 Calculus of kidney: Secondary | ICD-10-CM | POA: Insufficient documentation

## 2014-08-31 DIAGNOSIS — F419 Anxiety disorder, unspecified: Secondary | ICD-10-CM | POA: Diagnosis not present

## 2014-08-31 DIAGNOSIS — Z79899 Other long term (current) drug therapy: Secondary | ICD-10-CM | POA: Diagnosis not present

## 2014-08-31 DIAGNOSIS — Z7982 Long term (current) use of aspirin: Secondary | ICD-10-CM | POA: Insufficient documentation

## 2014-08-31 HISTORY — PX: CYSTOSCOPY WITH RETROGRADE PYELOGRAM, URETEROSCOPY AND STENT PLACEMENT: SHX5789

## 2014-08-31 SURGERY — CYSTOURETEROSCOPY, WITH RETROGRADE PYELOGRAM AND STENT INSERTION
Anesthesia: General | Laterality: Left

## 2014-08-31 MED ORDER — ONDANSETRON HCL 4 MG/2ML IJ SOLN
INTRAMUSCULAR | Status: DC | PRN
  Administered 2014-08-31: 4 mg via INTRAVENOUS

## 2014-08-31 MED ORDER — LIDOCAINE HCL (CARDIAC) 20 MG/ML IV SOLN
INTRAVENOUS | Status: DC | PRN
  Administered 2014-08-31: 20 mg via INTRAVENOUS

## 2014-08-31 MED ORDER — CEFAZOLIN SODIUM-DEXTROSE 2-3 GM-% IV SOLR
2.0000 g | INTRAVENOUS | Status: AC
  Administered 2014-08-31: 2 g via INTRAVENOUS

## 2014-08-31 MED ORDER — IOHEXOL 300 MG/ML  SOLN
INTRAMUSCULAR | Status: DC | PRN
  Administered 2014-08-31: 10 mL via URETHRAL

## 2014-08-31 MED ORDER — FENTANYL CITRATE (PF) 100 MCG/2ML IJ SOLN
INTRAMUSCULAR | Status: AC
  Filled 2014-08-31: qty 2

## 2014-08-31 MED ORDER — PROMETHAZINE HCL 25 MG/ML IJ SOLN: 6.2500 mg | INTRAMUSCULAR | Status: DC | PRN

## 2014-08-31 MED ORDER — SODIUM CHLORIDE 0.9 % IR SOLN
Status: DC | PRN
  Administered 2014-08-31: 4000 mL via INTRAVESICAL

## 2014-08-31 MED ORDER — LIDOCAINE HCL (CARDIAC) 20 MG/ML IV SOLN
INTRAVENOUS | Status: AC
Start: 2014-08-31 — End: 2014-08-31
  Filled 2014-08-31: qty 5

## 2014-08-31 MED ORDER — PHENAZOPYRIDINE HCL 200 MG PO TABS: 200.0000 mg | ORAL_TABLET | Freq: Three times a day (TID) | ORAL | Status: DC | PRN

## 2014-08-31 MED ORDER — ONDANSETRON HCL 4 MG/2ML IJ SOLN
INTRAMUSCULAR | Status: AC
  Filled 2014-08-31: qty 2

## 2014-08-31 MED ORDER — PROPOFOL 10 MG/ML IV BOLUS
INTRAVENOUS | Status: AC
  Filled 2014-08-31: qty 20

## 2014-08-31 MED ORDER — CEFAZOLIN SODIUM-DEXTROSE 2-3 GM-% IV SOLR
INTRAVENOUS | Status: AC
  Filled 2014-08-31: qty 50

## 2014-08-31 MED ORDER — DEXAMETHASONE SODIUM PHOSPHATE 10 MG/ML IJ SOLN
INTRAMUSCULAR | Status: DC | PRN
  Administered 2014-08-31: 10 mg via INTRAVENOUS

## 2014-08-31 MED ORDER — PHENAZOPYRIDINE HCL 200 MG PO TABS
200.0000 mg | ORAL_TABLET | Freq: Once | ORAL | Status: AC
  Administered 2014-08-31: 200 mg via ORAL
  Filled 2014-08-31: qty 1

## 2014-08-31 MED ORDER — OXYCODONE-ACETAMINOPHEN 5-325 MG PO TABS: 1.0000 | ORAL_TABLET | ORAL | Status: DC | PRN

## 2014-08-31 MED ORDER — MIDAZOLAM HCL 2 MG/2ML IJ SOLN
INTRAMUSCULAR | Status: AC
  Filled 2014-08-31: qty 4

## 2014-08-31 MED ORDER — OXYBUTYNIN CHLORIDE ER 10 MG PO TB24: 10.0000 mg | ORAL_TABLET | Freq: Every day | ORAL | Status: DC

## 2014-08-31 MED ORDER — FENTANYL CITRATE (PF) 250 MCG/5ML IJ SOLN
INTRAMUSCULAR | Status: DC | PRN
  Administered 2014-08-31: 25 ug via INTRAVENOUS
  Administered 2014-08-31: 50 ug via INTRAVENOUS
  Administered 2014-08-31: 25 ug via INTRAVENOUS
  Administered 2014-08-31: 50 ug via INTRAVENOUS

## 2014-08-31 MED ORDER — TAMSULOSIN HCL 0.4 MG PO CAPS: 0.4000 mg | ORAL_CAPSULE | Freq: Every day | ORAL | Status: DC

## 2014-08-31 MED ORDER — FENTANYL CITRATE (PF) 100 MCG/2ML IJ SOLN
INTRAMUSCULAR | Status: AC
  Filled 2014-08-31: qty 4

## 2014-08-31 MED ORDER — LACTATED RINGERS IV SOLN
INTRAVENOUS | Status: DC
  Administered 2014-08-31: 1000 mL via INTRAVENOUS
  Administered 2014-08-31: 11:00:00 via INTRAVENOUS

## 2014-08-31 MED ORDER — FENTANYL CITRATE (PF) 100 MCG/2ML IJ SOLN
25.0000 ug | INTRAMUSCULAR | Status: DC | PRN
  Administered 2014-08-31: 50 ug via INTRAVENOUS
  Administered 2014-08-31 (×2): 25 ug via INTRAVENOUS

## 2014-08-31 MED ORDER — FENTANYL CITRATE (PF) 100 MCG/2ML IJ SOLN: INTRAMUSCULAR | Status: DC | PRN

## 2014-08-31 MED ORDER — PROPOFOL 10 MG/ML IV BOLUS
INTRAVENOUS | Status: DC | PRN
  Administered 2014-08-31: 150 mg via INTRAVENOUS

## 2014-08-31 MED ORDER — MIDAZOLAM HCL 5 MG/5ML IJ SOLN
INTRAMUSCULAR | Status: DC | PRN
  Administered 2014-08-31: 2 mg via INTRAVENOUS

## 2014-08-31 MED ORDER — DEXAMETHASONE SODIUM PHOSPHATE 10 MG/ML IJ SOLN
INTRAMUSCULAR | Status: AC
  Filled 2014-08-31: qty 1

## 2014-08-31 MED ORDER — OXYCODONE-ACETAMINOPHEN 5-325 MG PO TABS
1.0000 | ORAL_TABLET | Freq: Once | ORAL | Status: AC
  Administered 2014-08-31: 1 via ORAL
  Filled 2014-08-31: qty 1

## 2014-08-31 SURGICAL SUPPLY — 20 items
BAG URO CATCHER STRL LF (DRAPE) ×2 IMPLANT
BASKET LASER NITINOL 1.9FR (BASKET) IMPLANT
CATH INTERMIT  6FR 70CM (CATHETERS) ×2 IMPLANT
CLOTH BEACON ORANGE TIMEOUT ST (SAFETY) ×2 IMPLANT
EXTRACTOR STONE NITINOL NGAGE (UROLOGICAL SUPPLIES) ×2 IMPLANT
FIBER LASER FLEXIVA 200 (UROLOGICAL SUPPLIES) IMPLANT
FIBER LASER TRAC TIP (UROLOGICAL SUPPLIES) IMPLANT
GLOVE BIO SURGEON STRL SZ8 (GLOVE) ×6 IMPLANT
GOWN STRL REUS W/TWL XL LVL3 (GOWN DISPOSABLE) ×4 IMPLANT
GUIDEWIRE ANG ZIPWIRE 038X150 (WIRE) ×2 IMPLANT
GUIDEWIRE STR DUAL SENSOR (WIRE) ×2 IMPLANT
IV NS 1000ML (IV SOLUTION)
IV NS 1000ML BAXH (IV SOLUTION) IMPLANT
MANIFOLD NEPTUNE II (INSTRUMENTS) ×2 IMPLANT
PACK CYSTO (CUSTOM PROCEDURE TRAY) ×2 IMPLANT
STENT CONTOUR 6FRX26X.038 (STENTS) IMPLANT
STENT URET 6FRX24 CONTOUR (STENTS) ×2 IMPLANT
SYR CONTROL 10ML LL (SYRINGE) ×2 IMPLANT
TUBE FEEDING 8FR 16IN STR KANG (MISCELLANEOUS) IMPLANT
TUBING CONNECTING 10 (TUBING) ×2 IMPLANT

## 2014-08-31 NOTE — Transfer of Care (Signed)
Immediate Anesthesia Transfer of Care Note  Patient: Candice Estrada  Procedure(s) Performed: Procedure(s): CYSTOSCOPY WITH BILATERAL RETROGRADE PYELOGRAM, URETEROSCOPY AND STENT PLACEMENT, STONE EXTRACTION WITH BASKET (Left)  Patient Location: PACU  Anesthesia Type:General  Level of Consciousness:  sedated, patient cooperative and responds to stimulation  Airway & Oxygen Therapy:Patient Spontanous Breathing and Patient connected to face mask oxgen  Post-op Assessment:  Report given to PACU RN and Post -op Vital signs reviewed and stable  Post vital signs:  Reviewed and stable  Last Vitals:  Filed Vitals:   08/31/14 0748  BP: 115/78  Pulse: 108  Temp: 36.4 C  Resp: 18    Complications: No apparent anesthesia complications

## 2014-08-31 NOTE — Progress Notes (Signed)
Up to bathroom with assistance-voided q.s.- urine red in color

## 2014-08-31 NOTE — Anesthesia Procedure Notes (Signed)
Procedure Name: LMA Insertion Date/Time: 08/31/2014 10:22 AM Performed by: Lajuana Carry E Pre-anesthesia Checklist: Patient identified, Emergency Drugs available, Suction available and Patient being monitored Patient Re-evaluated:Patient Re-evaluated prior to inductionOxygen Delivery Method: Circle system utilized Preoxygenation: Pre-oxygenation with 100% oxygen Intubation Type: IV induction Ventilation: Mask ventilation without difficulty LMA: LMA inserted LMA Size: 4.0 Number of attempts: 1 Placement Confirmation: positive ETCO2 and breath sounds checked- equal and bilateral Dental Injury: Teeth and Oropharynx as per pre-operative assessment

## 2014-08-31 NOTE — Progress Notes (Signed)
Pt "feeling faint" post-op. VS stable. O2 sat 100%.  EKG leads applied. HR and rhythm stable. Encouraged slow deep breathing. Pt reassured, emotional support provided. Family at bedside and updated. Attempt page to MD. Bedside assessment/ monitor for 45 minutes one on one with improvement in symptoms and pain.

## 2014-08-31 NOTE — Brief Op Note (Signed)
08/31/2014  11:09 AM  PATIENT:  Candice Estrada  39 y.o. female  PRE-OPERATIVE DIAGNOSIS:  LEFT RENAL STONES, GROSS HEMATURIA  POST-OPERATIVE DIAGNOSIS:  left renal stone  PROCEDURE:  Procedure(s): CYSTOSCOPY WITH BILATERAL RETROGRADE PYELOGRAM, URETEROSCOPY AND STENT PLACEMENT, STONE EXTRACTION WITH BASKET (Left)  SURGEON:  Surgeon(s) and Role:    * Cleon Gustin, MD - Primary  PHYSICIAN ASSISTANT:   ASSISTANTS: none   ANESTHESIA:   general  EBL:     BLOOD ADMINISTERED:none  DRAINS: left 6x24 JJ stent  LOCAL MEDICATIONS USED:  NONE  SPECIMEN:  Source of Specimen:  bilateral ureteral cytology, bladder cystology, left renal stone  DISPOSITION OF SPECIMEN:  PATHOLOGY  COUNTS:  YES  TOURNIQUET:  * No tourniquets in log *  DICTATION: .Note written in EPIC  PLAN OF CARE: Discharge to home after PACU  PATIENT DISPOSITION:  PACU - hemodynamically stable.   Delay start of Pharmacological VTE agent (>24hrs) due to surgical blood loss or risk of bleeding: not applicable

## 2014-08-31 NOTE — Anesthesia Postprocedure Evaluation (Signed)
  Anesthesia Post-op Note  Patient: Candice Estrada  Procedure(s) Performed: Procedure(s) (LRB): CYSTOSCOPY WITH BILATERAL RETROGRADE PYELOGRAM, URETEROSCOPY AND STENT PLACEMENT, STONE EXTRACTION WITH BASKET (Left)  Patient Location: PACU  Anesthesia Type: General  Level of Consciousness: awake and alert   Airway and Oxygen Therapy: Patient Spontanous Breathing  Post-op Pain: mild  Post-op Assessment: Post-op Vital signs reviewed, Patient's Cardiovascular Status Stable, Respiratory Function Stable, Patent Airway and No signs of Nausea or vomiting  Last Vitals:  Filed Vitals:   08/31/14 1150  BP:   Pulse: 98  Temp:   Resp: 20    Post-op Vital Signs: stable   Complications: No apparent anesthesia complications

## 2014-08-31 NOTE — Anesthesia Preprocedure Evaluation (Addendum)
Anesthesia Evaluation  Patient identified by MRN, date of birth, ID band Patient awake    Reviewed: Allergy & Precautions, NPO status , Patient's Chart, lab work & pertinent test results  History of Anesthesia Complications (+) history of anesthetic complications  Airway Mallampati: II  TM Distance: >3 FB Neck ROM: Full    Dental no notable dental hx.    Pulmonary neg pulmonary ROS,  breath sounds clear to auscultation  Pulmonary exam normal       Cardiovascular Normal cardiovascular exam+ dysrhythmias Rhythm:Regular Rate:Normal  H/O ASD s/o repair.   Neuro/Psych Anxiety TIACVA    GI/Hepatic negative GI ROS, Neg liver ROS,   Endo/Other  negative endocrine ROS  Renal/GU Renal disease  negative genitourinary   Musculoskeletal negative musculoskeletal ROS (+)   Abdominal   Peds negative pediatric ROS (+)  Hematology negative hematology ROS (+)   Anesthesia Other Findings   Reproductive/Obstetrics negative OB ROS Negative pregnancy test.                            Anesthesia Physical Anesthesia Plan  ASA: III  Anesthesia Plan: General   Post-op Pain Management:    Induction: Intravenous  Airway Management Planned: LMA  Additional Equipment:   Intra-op Plan:   Post-operative Plan: Extubation in OR  Informed Consent: I have reviewed the patients History and Physical, chart, labs and discussed the procedure including the risks, benefits and alternatives for the proposed anesthesia with the patient or authorized representative who has indicated his/her understanding and acceptance.   Dental advisory given  Plan Discussed with: CRNA  Anesthesia Plan Comments: (Very anxious, will give fentanyl now.)       Anesthesia Quick Evaluation

## 2014-08-31 NOTE — Discharge Instructions (Signed)

## 2014-08-31 NOTE — Op Note (Signed)
Preoperative diagnosis: Left renal stone, positive urine cytology  Postoperative diagnosis: Same  Procedure: 1 cystoscopy 2. Bilateral retrograde pyelography 3.  Intraoperative fluoroscopy, under one hour, with interpretation 4.  Left ureteroscopic stone manipulation with basket extraction 5.  Left 6 x 24 JJ stent placement  Attending: Rosie Fate  Anesthesia: General  Estimated blood loss: None  Drains: Left 6 x 24 JJ ureteral stent without tether  Specimens: 1. Bilateral renal cytologies 2. Bladder cytology 3. Left renal stone  Antibiotics: ancef  Findings: left lower and upper pole calculus. No masses/lesions in the bladder. Ureteral orifices in normal anatomic location. No hydronephrosis or filling defects in the right collecting system. No filling defects of hydronephrosis in the left collecting system  Indications: Patient is a 39 year old female/female with a history of left renal stone, gross hematuria and positive urine cytology.  After discussing treatment options, she decided proceed with left ureteroscopic stone manipulation and selective cytologies.  Procedure her in detail: The patient was brought to the operating room and a brief timeout was done to ensure correct patient, correct procedure, correct site.  General anesthesia was administered patient was placed in dorsal lithotomy position. Their genitalia was then prepped and draped in usual sterile fashion.  A rigid 16 French cystoscope was passed in the urethra and the bladder.  Bladder was inspected free masses or lesions. We then obtained a bladder cytology.  the ureteral orifices were in the normal orthotopic locations. a 6 french ureteral catheter was then instilled into the right ureteral orifice we then obtained a right ureteral/renal cytology. a gentle retrograde was obtained and findings noted above.    a 6 french ureteral catheter was then instilled into the left ureteral orifice we then obtained a left  ureteral/renal cytology.  a gentle retrograde was obtained and findings noted above.  we then placed a zip wire through the ureteral catheter and advanced up to the renal pelvis.  we then removed the cystoscope and cannulated the left ureteral orifice with a semirigid ureteroscope.  No stone was found in the ureter. Once we reached the UPJ a sensor wire was advanced in to the renal pelvis. We then removed the ureteroscope and advanced a flexible ureteroscope over the sensor wire. We encountered the stones in the lower and upper pole.  the stones were then removed with a Ngage basket.   We then placed a 6 x 26 double-j ureteral stent over the original zip wire. We then removed the wire and good coil was noted in the the renal pelvis under fluoroscopy and the bladder under direct vision.  the bladder was then drained and this concluded the procedure which was well tolerated by patient.  Complications: None  Condition: Stable, extubated, transferred to PACU  Plan: Patient is to be discharged home as to follow-up in one week for stent removal.

## 2014-08-31 NOTE — H&P (Signed)
Urology Admission H&P  Chief Complaint: left flank pain  History of Present Illness: Candice Estrada is a 39yo with a hx of gross hematuria. She underwent CT scan which showed a left renal stone. She also had an abnormal urine cytology.  Past Medical History  Diagnosis Date  . Ovarian cyst   . Anxiety   . ASD (atrial septal defect), ostium secundum     Status post closure 08/20/10 with Dr. Burt Knack; procedure complicated by right groin hematoma and acute blood loss anemia  . History of TIAs   . IUD (intrauterine device) in place 12/16/2012  . Thyroid enlarged 12/16/2012    History of cyst will get [place]  . Complication of anesthesia     VASO-VAGAL RESPONSE IN RECOVERY ROOM AFTER HEART SURGERY  . Dysrhythmia     A-FIB  . Hypotension   . Hx of dizziness     random episodes "because my BP is so low"  . Stroke     weakness l foot  . History of psoriasis     scalp  . Kidney stone   . Hematuria   . Night terrors    Past Surgical History  Procedure Laterality Date  . Tonsillectomy    . Foot neuroma surgery  2009    left  . Thyroid cyst excision    . Asd repair  08/20/2010  . Cardiac surgery      Home Medications:  Prescriptions prior to admission  Medication Sig Dispense Refill Last Dose  . aspirin EC 81 MG tablet Take 81 mg by mouth every morning.    08/30/2014 at 0830  . buPROPion (WELLBUTRIN SR) 150 MG 12 hr tablet Take 1 tablet (150 mg total) by mouth daily. 90 tablet 4 08/31/2014 at 0700  . ibuprofen (ADVIL,MOTRIN) 800 MG tablet TAKE 1 TABLET EVERY 8 HOURS AS NEEDED (Patient taking differently: TAKE 1 TABLET EVERY 8 HOURS AS NEEDED FOR PAIN.) 90 tablet 3 Past Week at Unknown time  . Multiple Vitamin (MULTIVITAMIN WITH MINERALS) TABS Take 1 tablet by mouth every morning.    08/30/2014 at 0830  . PARAGARD INTRAUTERINE COPPER IU by Intrauterine route.   patent  . meclizine (ANTIVERT) 12.5 MG tablet Take 1 tablet (12.5 mg total) by mouth daily as needed for dizziness. (Patient not taking:  Reported on 08/18/2014) 30 tablet 0 Taking  . midodrine (PROAMATINE) 2.5 MG tablet Take 1 tablet (2.5 mg total) by mouth 2 (two) times daily with a meal. (Patient not taking: Reported on 08/18/2014) 60 tablet 3 Taking   Allergies: No Known Allergies  Family History  Problem Relation Age of Onset  . Hypertension Father 30  . Stroke Father   . Heart attack Father   . Brain cancer Father   . Heart disease Father   . Diabetes Father   . Liver disease Mother 32    dead  . Cancer Mother     breast  . Hypertension Brother 20  . Cancer Paternal Grandmother     breast  . Dementia Maternal Grandmother   . Cancer Maternal Grandmother     thyroid  . Cancer Paternal Aunt     breast   . Kidney failure Daughter   . Cancer Other     mom's grandma-breast   Social History:  reports that she has never smoked. She has never used smokeless tobacco. She reports that she drinks alcohol. She reports that she does not use illicit drugs.  Review of Systems  All other systems reviewed  and are negative.   Physical Exam:  Vital signs in last 24 hours: Temp:  [97.5 F (36.4 C)] 97.5 F (36.4 C) (08/17 0748) Pulse Rate:  [108] 108 (08/17 0748) Resp:  [18] 18 (08/17 0748) BP: (115)/(78) 115/78 mmHg (08/17 0748) SpO2:  [99 %] 99 % (08/17 0748) Weight:  [62.143 kg (137 lb)] 62.143 kg (137 lb) (08/17 0752) Physical Exam  Constitutional: She is oriented to person, place, and time. She appears well-developed and well-nourished.  HENT:  Head: Normocephalic and atraumatic.  Eyes: EOM are normal. Pupils are equal, round, and reactive to light.  Neck: Normal range of motion.  Cardiovascular: Normal rate and regular rhythm.   Respiratory: Effort normal and breath sounds normal.  GI: Soft. She exhibits no distension.  Musculoskeletal: Normal range of motion.  Neurological: She is alert and oriented to person, place, and time.  Skin: Skin is warm and dry.  Psychiatric: She has a normal mood and affect. Her  behavior is normal. Judgment and thought content normal.    Laboratory Data:  No results found for this or any previous visit (from the past 24 hour(s)). No results found for this or any previous visit (from the past 240 hour(s)). Creatinine: No results for input(s): CREATININE in the last 168 hours.   Impression/Assessment:  Left renal stone, positive cytology  Plan:  Risks/benefits/alternatives to cysto, bilateral retrogrades, possible bladder biopsy, left stone extraction was explained to the patient and she understands and wishes to proceed with surgery  Severin Bou L 08/31/2014, 9:54 AM

## 2014-09-01 ENCOUNTER — Encounter (HOSPITAL_COMMUNITY): Payer: Self-pay | Admitting: Urology

## 2014-09-02 ENCOUNTER — Other Ambulatory Visit: Payer: Self-pay | Admitting: *Deleted

## 2014-09-02 MED ORDER — MIDODRINE HCL 2.5 MG PO TABS: 2.5000 mg | ORAL_TABLET | Freq: Two times a day (BID) | ORAL | Status: DC

## 2014-09-09 ENCOUNTER — Encounter: Payer: Self-pay | Admitting: Internal Medicine

## 2014-09-17 ENCOUNTER — Other Ambulatory Visit: Payer: Self-pay | Admitting: Family Medicine

## 2014-09-20 NOTE — Telephone Encounter (Signed)
Medication refilled per protocol. 

## 2014-09-28 ENCOUNTER — Other Ambulatory Visit: Payer: BLUE CROSS/BLUE SHIELD | Admitting: Adult Health

## 2014-10-06 ENCOUNTER — Ambulatory Visit (INDEPENDENT_AMBULATORY_CARE_PROVIDER_SITE_OTHER): Payer: BLUE CROSS/BLUE SHIELD | Admitting: Adult Health

## 2014-10-06 ENCOUNTER — Other Ambulatory Visit (HOSPITAL_COMMUNITY)
Admission: RE | Admit: 2014-10-06 | Discharge: 2014-10-06 | Disposition: A | Discharge status: Home / Self Care | Payer: BLUE CROSS/BLUE SHIELD | Source: Ambulatory Visit | Attending: Adult Health | Admitting: Adult Health

## 2014-10-06 ENCOUNTER — Encounter: Payer: Self-pay | Admitting: Adult Health

## 2014-10-06 VITALS — BP 110/64 | HR 80 | Ht 63.0 in | Wt 136.0 lb

## 2014-10-06 DIAGNOSIS — Z1212 Encounter for screening for malignant neoplasm of rectum: Secondary | ICD-10-CM | POA: Diagnosis not present

## 2014-10-06 DIAGNOSIS — N898 Other specified noninflammatory disorders of vagina: Secondary | ICD-10-CM | POA: Diagnosis not present

## 2014-10-06 DIAGNOSIS — Z01419 Encounter for gynecological examination (general) (routine) without abnormal findings: Secondary | ICD-10-CM

## 2014-10-06 DIAGNOSIS — Z1151 Encounter for screening for human papillomavirus (HPV): Secondary | ICD-10-CM | POA: Diagnosis not present

## 2014-10-06 DIAGNOSIS — N760 Acute vaginitis: Secondary | ICD-10-CM

## 2014-10-06 DIAGNOSIS — F419 Anxiety disorder, unspecified: Secondary | ICD-10-CM

## 2014-10-06 DIAGNOSIS — Z01411 Encounter for gynecological examination (general) (routine) with abnormal findings: Secondary | ICD-10-CM | POA: Insufficient documentation

## 2014-10-06 DIAGNOSIS — E049 Nontoxic goiter, unspecified: Secondary | ICD-10-CM

## 2014-10-06 DIAGNOSIS — B9689 Other specified bacterial agents as the cause of diseases classified elsewhere: Secondary | ICD-10-CM

## 2014-10-06 DIAGNOSIS — Z975 Presence of (intrauterine) contraceptive device: Secondary | ICD-10-CM

## 2014-10-06 HISTORY — DX: Other specified bacterial agents as the cause of diseases classified elsewhere: B96.89

## 2014-10-06 HISTORY — DX: Other specified bacterial agents as the cause of diseases classified elsewhere: N76.0

## 2014-10-06 LAB — POCT WET PREP (WET MOUNT): WBC, Wet Prep HPF POC: POSITIVE

## 2014-10-06 LAB — HEMOCCULT GUIAC POC 1CARD (OFFICE): Fecal Occult Blood, POC: NEGATIVE

## 2014-10-06 MED ORDER — METRONIDAZOLE 0.75 % VA GEL: VAGINAL | Status: DC

## 2014-10-06 NOTE — Progress Notes (Signed)
Patient ID: Candice Estrada, female   DOB: 1975/05/10, 39 y.o.   MRN: [id number] History of Present Illness: Candice Estrada is a 39 year old white female, married in for well woman gyn exam and pap.She complains of odor at times and has had irregular bleeding with paragard IUD.Had kidney stone recently. PCP is Dr Buelah Manis.  Current Medications, Allergies, Past Medical History, Past Surgical History, Family History and Social History were reviewed in [name] record.     Review of Systems: Patient denies any headaches, hearing loss, fatigue, blurred vision, shortness of breath, chest pain, abdominal pain, problems with bowel movements, urination, or intercourse. No joint pain or mood swings.See HPI for positives.    Physical Exam:BP 110/64 mmHg  Pulse 80  Ht 5' 3"  (1.6 m)  Wt 136 lb (61.689 kg)  BMI 24.10 kg/m2  LMP 10/06/2014 General:  Well developed, well nourished, no acute distress Skin:  Warm and dry Neck:  Midline trachea, enlarged thyroid, good ROM, no lymphadenopathy Lungs; Clear to auscultation bilaterally Breast:  No dominant palpable mass, retraction, or nipple discharge Cardiovascular: Regular rate and rhythm Abdomen:  Soft, non tender, no hepatosplenomegaly Pelvic:  External genitalia is normal in appearance, no lesions.  The vagina is normal in appearance,but has tan discharge. Urethra has no lesions or masses. The cervix is bulbous, +IUD strings, pap with HPV performed and cervix is friable with EC brush, wet prep:+WBC and clue cells.  Uterus is felt to be normal size, shape, and contour.  No adnexal masses or tenderness noted.Bladder is non tender, no masses felt. Rectal: Good sphincter tone, no polyps, or hemorrhoids felt.  Hemoccult negative. Extremities/musculoskeletal:  No swelling or varicosities noted, no clubbing or cyanosis Psych:  No mood changes, alert and cooperative,seems happy   Impression: Well woman gyn exam with pap Vaginal  discharge BV IUD in place Anxiety Thyroid enlarged    Plan: Rx metrogel 1 applicator in vagina at hs x 5 nights with 1 refill Physical in 1 year Mammogram yearly Labs with PCP Continue wellbutrin has refills  Review handout on BV

## 2014-10-06 NOTE — Patient Instructions (Signed)
Physical in  1 year Bacterial Vaginosis Bacterial vaginosis is a vaginal infection that occurs when the normal balance of bacteria in the vagina is disrupted. It results from an overgrowth of certain bacteria. This is the most common vaginal infection in women of childbearing age. Treatment is important to prevent complications, especially in pregnant women, as it can cause a premature delivery. CAUSES  Bacterial vaginosis is caused by an increase in harmful bacteria that are normally present in smaller amounts in the vagina. Several different kinds of bacteria can cause bacterial vaginosis. However, the reason that the condition develops is not fully understood. RISK FACTORS Certain activities or behaviors can put you at an increased risk of developing bacterial vaginosis, including:  Having a new sex partner or multiple sex partners.  Douching.  Using an intrauterine device (IUD) for contraception. Women do not get bacterial vaginosis from toilet seats, bedding, swimming pools, or contact with objects around them. SIGNS AND SYMPTOMS  Some women with bacterial vaginosis have no signs or symptoms. Common symptoms include:  Grey vaginal discharge.  A fishlike odor with discharge, especially after sexual intercourse.  Itching or burning of the vagina and vulva.  Burning or pain with urination. DIAGNOSIS  Your health care provider will take a medical history and examine the vagina for signs of bacterial vaginosis. A sample of vaginal fluid may be taken. Your health care provider will look at this sample under a microscope to check for bacteria and abnormal cells. A vaginal pH test may also be done.  TREATMENT  Bacterial vaginosis may be treated with antibiotic medicines. These may be given in the form of a pill or a vaginal cream. A second round of antibiotics may be prescribed if the condition comes back after treatment.  HOME CARE INSTRUCTIONS   Only take over-the-counter or prescription  medicines as directed by your health care provider.  If antibiotic medicine was prescribed, take it as directed. Make sure you finish it even if you start to feel better.  Do not have sex until treatment is completed.  Tell all sexual partners that you have a vaginal infection. They should see their health care provider and be treated if they have problems, such as a mild rash or itching.  Practice safe sex by using condoms and only having one sex partner. SEEK MEDICAL CARE IF:   Your symptoms are not improving after 3 days of treatment.  You have increased discharge or pain.  You have a fever. MAKE SURE YOU:   Understand these instructions.  Will watch your condition.  Will get help right away if you are not doing well or get worse. FOR MORE INFORMATION  Centers for Disease Control and Prevention, Division of STD Prevention: AppraiserFraud.fi American Sexual Health Association (ASHA): www.ashastd.org  Document Released: 12/31/2004 Document Revised: 10/21/2012 Document Reviewed: 08/12/2012 [facility] Patient Information 2015 [place], [place]. This information is not intended to replace advice given to you by your health care provider. Make sure you discuss any questions you have with your health care provider. Mammogram yearly

## 2014-10-10 LAB — CYTOLOGY - PAP

## 2014-10-14 ENCOUNTER — Other Ambulatory Visit: Payer: Self-pay

## 2014-10-14 DIAGNOSIS — Z1231 Encounter for screening mammogram for malignant neoplasm of breast: Secondary | ICD-10-CM

## 2014-10-16 ENCOUNTER — Other Ambulatory Visit: Payer: Self-pay | Admitting: Internal Medicine

## 2014-10-19 ENCOUNTER — Ambulatory Visit: Payer: BLUE CROSS/BLUE SHIELD | Admitting: Internal Medicine

## 2014-10-24 ENCOUNTER — Ambulatory Visit: Payer: BLUE CROSS/BLUE SHIELD | Admitting: Internal Medicine

## 2014-11-03 ENCOUNTER — Telehealth: Payer: Self-pay | Admitting: *Deleted

## 2014-11-03 MED ORDER — BUPROPION HCL ER (SR) 150 MG PO TB12
150.0000 mg | ORAL_TABLET | Freq: Two times a day (BID) | ORAL | Status: DC
Start: 2014-11-03 — End: 2014-11-30

## 2014-11-03 NOTE — Telephone Encounter (Signed)
Yes can take 1 bid, will reorder

## 2014-11-04 ENCOUNTER — Ambulatory Visit: Payer: BLUE CROSS/BLUE SHIELD

## 2014-11-17 ENCOUNTER — Ambulatory Visit: Payer: BLUE CROSS/BLUE SHIELD | Admitting: Obstetrics & Gynecology

## 2014-11-25 ENCOUNTER — Ambulatory Visit (HOSPITAL_COMMUNITY)
Admission: RE | Admit: 2014-11-25 | Discharge: 2014-11-25 | Disposition: A | Discharge status: Home / Self Care | Payer: BLUE CROSS/BLUE SHIELD | Source: Ambulatory Visit | Attending: Family Medicine | Admitting: Family Medicine

## 2014-11-25 ENCOUNTER — Telehealth: Payer: Self-pay | Admitting: Family Medicine

## 2014-11-25 DIAGNOSIS — Z1231 Encounter for screening mammogram for malignant neoplasm of breast: Secondary | ICD-10-CM | POA: Insufficient documentation

## 2014-11-25 DIAGNOSIS — N6310 Unspecified lump in the right breast, unspecified quadrant: Secondary | ICD-10-CM

## 2014-11-25 NOTE — Telephone Encounter (Signed)
Pt is requesting a referral for an ultrasound of her kidneys. She is having left side pain and blood in her urine. She believes that she has kidney stones again, and Dr. Buelah Manis told her that if she has any more problems with them that we would send her for an U/S. Please advise pt [phone]

## 2014-11-25 NOTE — Telephone Encounter (Signed)
Patient has been seen by [facility] for stones.   Patient should F/U with urology.   Call placed to patient to advise. VM full and no message left.

## 2014-11-28 NOTE — Telephone Encounter (Signed)
Pt aware of below and will call Urology to schedule appt

## 2014-11-28 NOTE — Telephone Encounter (Signed)
Call placed to patient. VM full and no message left.

## 2014-11-30 ENCOUNTER — Telehealth: Payer: Self-pay | Admitting: Adult Health

## 2014-11-30 MED ORDER — BUPROPION HCL ER (SR) 150 MG PO TB12: 150.0000 mg | ORAL_TABLET | Freq: Two times a day (BID) | ORAL | Status: DC

## 2014-11-30 NOTE — Telephone Encounter (Signed)
Will reorder wellbutrin

## 2014-12-01 ENCOUNTER — Other Ambulatory Visit: Payer: Self-pay | Admitting: Internal Medicine

## 2014-12-02 ENCOUNTER — Ambulatory Visit: Payer: BLUE CROSS/BLUE SHIELD

## 2014-12-29 ENCOUNTER — Telehealth: Payer: Self-pay | Admitting: Adult Health

## 2014-12-29 MED ORDER — FLUCONAZOLE 150 MG PO TABS: 150.0000 mg | ORAL_TABLET | Freq: Once | ORAL | Status: DC

## 2014-12-29 MED ORDER — TERCONAZOLE 0.4 % VA CREA: 1.0000 | TOPICAL_CREAM | Freq: Every day | VAGINAL | Status: DC

## 2014-12-29 NOTE — Telephone Encounter (Signed)
Has clumpy discharge has taken diflucan will rx terazol 7

## 2015-01-11 ENCOUNTER — Ambulatory Visit (INDEPENDENT_AMBULATORY_CARE_PROVIDER_SITE_OTHER): Payer: BLUE CROSS/BLUE SHIELD | Admitting: Internal Medicine

## 2015-01-11 ENCOUNTER — Encounter: Payer: Self-pay | Admitting: Internal Medicine

## 2015-01-11 VITALS — BP 110/70 | HR 82 | Ht 63.0 in | Wt 139.2 lb

## 2015-01-11 DIAGNOSIS — G90A Postural orthostatic tachycardia syndrome (POTS): Secondary | ICD-10-CM

## 2015-01-11 DIAGNOSIS — R000 Tachycardia, unspecified: Secondary | ICD-10-CM

## 2015-01-11 DIAGNOSIS — I951 Orthostatic hypotension: Secondary | ICD-10-CM | POA: Diagnosis not present

## 2015-01-11 DIAGNOSIS — Q211 Atrial septal defect, unspecified: Secondary | ICD-10-CM

## 2015-01-11 DIAGNOSIS — I498 Other specified cardiac arrhythmias: Secondary | ICD-10-CM

## 2015-01-11 NOTE — Progress Notes (Signed)
Patient Care Team: Alycia Rossetti, MD as PCP - General (Family Medicine)   HPI  Candice Estrada is a 39 y.o. female Are seen in followup for syncope in the context of a prior ASD closure  She continues to complain of palpitations which are becoming increasingly frequent. They're quite frightening at times. They're relatively brief lasting only seconds and hence has not been amenable to the AliveCor monitor. Review of her old chart demonstrates nonsustained ventricular tachycardia on her ZIO patch. In her mind these are similar.  Echocardiogram was normal. signal average ECG was normal   She struggled with exercise tachycardia. This is accompanied by shortness of breath. She describes her volume status as being replete. Her salt intake is modest.  She has violaceous discoloration of her lower extremities with standing.  fQRS HFLA <40 RMS40  96 26 56      Past Medical History  Diagnosis Date  . Ovarian cyst   . Anxiety   . ASD (atrial septal defect), ostium secundum     Status post closure 08/20/10 with Dr. Burt Knack; procedure complicated by right groin hematoma and acute blood loss anemia  . History of TIAs   . IUD (intrauterine device) in place 12/16/2012  . Thyroid enlarged 12/16/2012    History of cyst will get [place]  . Complication of anesthesia     VASO-VAGAL RESPONSE IN RECOVERY ROOM AFTER HEART SURGERY  . Dysrhythmia     A-FIB  . Hypotension   . Hx of dizziness     random episodes "because my BP is so low"  . Stroke ([place])     weakness l foot  . History of psoriasis     scalp  . Kidney stone   . Hematuria   . Night terrors   . Meniere's disease   . Vaginal discharge 10/06/2014  . BV (bacterial vaginosis) 10/06/2014    Past Surgical History  Procedure Laterality Date  . Tonsillectomy    . Foot neuroma surgery  2009    left  . Thyroid cyst excision    . Asd repair  08/20/2010  . Cardiac surgery    . Cystoscopy with retrograde pyelogram, ureteroscopy and  stent placement Left 08/31/2014    Procedure: CYSTOSCOPY WITH BILATERAL RETROGRADE PYELOGRAM, URETEROSCOPY AND STENT PLACEMENT, STONE EXTRACTION WITH BASKET;  Surgeon: Cleon Gustin, MD;  Location: [facility];  Service: Urology;  Laterality: Left;    Current Outpatient Prescriptions  Medication Sig Dispense Refill  . aspirin EC 81 MG tablet Take 81 mg by mouth every morning.     Marland Kitchen buPROPion (WELLBUTRIN SR) 150 MG 12 hr tablet Take 1 tablet (150 mg total) by mouth 2 (two) times daily. 180 tablet 3  . fluconazole (DIFLUCAN) 150 MG tablet Take 150 mg by mouth daily.    Marland Kitchen ibuprofen (ADVIL,MOTRIN) 800 MG tablet TAKE 1 TABLET EVERY 8 HOURS AS NEEDED 90 tablet 2  . meclizine (ANTIVERT) 12.5 MG tablet Take 1 tablet (12.5 mg total) by mouth daily as needed for dizziness. 30 tablet 0  . metroNIDAZOLE (METROGEL VAGINAL) 0.75 % vaginal gel Use 1 applicator in vagina at HS x 5 night 70 g 1  . midodrine (PROAMATINE) 2.5 MG tablet Take 1 tablet (2.5 mg total) by mouth 2 (two) times daily with a meal. 180 tablet 0  . Multiple Vitamin (MULTIVITAMIN WITH MINERALS) TABS Take 1 tablet by mouth every morning.     Marland Kitchen PARAGARD INTRAUTERINE COPPER IU by Intrauterine route.    Marland Kitchen  terconazole (TERAZOL 7) 0.4 % vaginal cream Place 1 applicator vaginally at bedtime. 45 g 1   No current facility-administered medications for this visit.    No Known Allergies  Review of Systems negative except from HPI and PMH  Physical Exam BP 110/70 mmHg  Pulse 82  Ht 5' 3"  (1.6 m)  Wt 139 lb 3.2 oz (63.141 kg)  BMI 24.66 kg/m2 Well developed and well nourished in no acute distress HENT normal E scleral and icterus clear Neck Supple JVP flat; carotids brisk and full Clear to ausculation regular rate and rhythm, no murmurs gallops or rub Soft with active bowel sounds No clubbing cyanosis  Edema Alert and oriented, grossly normal motor and sensory function Skin Warm and Dry    Assessment and  Plan  Dizziness question  related to blood pressure  VT NS noted on ZIO patch 6/15  ASD repair AS Amplatzer  POTS ? Vertigo       she's been intercurrently diagnosed with Mnire's.  She continues with palpitations the mechanism which may be ongoing episodes of nonsustained ventricular tachycardia or potentially atrial arrhythmias related to her ASD repair.  We will use a LINQ redorder  She's also had ongoing problems with TIAs following her ASD repair and has had positive bubble studies. She raises questions about the implants or erosion and fragmentation. I told her I would look into this  We spent more than 50% of our >25 min visit in face to face counseling regarding the above

## 2015-01-11 NOTE — Patient Instructions (Signed)
Medication Instructions: - no changes  Labwork: - none  Procedures/Testing: - Ling (loop recorder implant) ** Wednesday 02/01/15 at 8:30 am      1) Arrive at [facility], [facility],  Entrance "A" at 7:00 am      2) At the [employer], tell them you are there for a procedure and you will be directed to the Short Stay Area.      3) Light breakfast the morning of your procedure      4) You may take your medications as usual  Follow-Up: - Your physician recommends that you schedule a follow-up appointment in: 10-14 days (from 02/01/15) after your procedure for a wound check with the device clinic  Any Additional Special Instructions Will Be Listed Below (If Applicable).

## 2015-01-17 ENCOUNTER — Telehealth: Payer: Self-pay | Admitting: Internal Medicine

## 2015-01-17 NOTE — Telephone Encounter (Signed)
New message      Asking Dr Caryl Comes if he has done any research on the occuler recall?  Please call

## 2015-01-18 NOTE — Telephone Encounter (Signed)
Reviewed with Dr. Caryl Comes yesterday evening- he stated he is working on getting an answer to her question. I left her a message today in this regard and that I will call her back when we have an answer.

## 2015-01-24 NOTE — Telephone Encounter (Signed)
Just trying to see if you have an update on this patient. She is scheduled for her LINQ on 1/18.

## 2015-01-26 NOTE — Telephone Encounter (Signed)
Per Dr. Caryl Comes- reviewed literature and reviewed with Dr. Burt Knack- nothing to support small pieces breaking off from her ASD repair that would be associated with symptoms.

## 2015-01-27 NOTE — Telephone Encounter (Signed)
Pt calling back, but states she is at work and will try and keep her cell phone on her so she won't miss the next call.  Please call back.

## 2015-01-27 NOTE — Telephone Encounter (Signed)
Pt is returning call to Alvis Lemmings, RN

## 2015-01-27 NOTE — Telephone Encounter (Signed)
Attempted to call the patient with Dr. Olin Pia findings. No answer at her primary # and her voice mail box is full. Will call back later.

## 2015-01-30 ENCOUNTER — Telehealth: Payer: Self-pay | Admitting: Internal Medicine

## 2015-01-30 DIAGNOSIS — R55 Syncope and collapse: Secondary | ICD-10-CM

## 2015-01-30 NOTE — Telephone Encounter (Signed)
Attempted to call the patient's cell #. Mailbox is full.  Called the patient's work #-  Left a message for her to call me back.

## 2015-01-30 NOTE — Telephone Encounter (Signed)
The patient called back- she is aware that Dr. Caryl Comes has found no literature to support that her device from her ASD repair could be breading off and contributing to symptoms. She questioned if she needs an MRI before her LINQ implant.  Reviewed with Dr. Caryl Comes- the issue is if her neurologist feels like she needs one, then it is ok to proceed before or after the [facility]. He spoke with the patient and she would prefer to do the MRI first and then she will call us back to reschedule the [facility] if needed.  I have called the EP lab and cancelled her LINQ implant for 02/01/15.

## 2015-01-30 NOTE — Telephone Encounter (Signed)
Will forward to Dr. Klein for review. 

## 2015-01-30 NOTE — Telephone Encounter (Signed)
F/u  Pt wanted to relay to RN- pt stated her Neuro office stated that they can not order MRI until after her MArch appt- and wondered if Dr Caryl Comes can order it. Please call back and discuss.

## 2015-01-31 NOTE — Telephone Encounter (Signed)
Per Dr. Caryl Comes- ok to order a brain MRI with contrast- Dx. Syncope I left a message for the patient to call.

## 2015-02-01 ENCOUNTER — Encounter (HOSPITAL_COMMUNITY): Admission: RE | Payer: Self-pay | Source: Ambulatory Visit

## 2015-02-01 ENCOUNTER — Ambulatory Visit (HOSPITAL_COMMUNITY)
Admission: RE | Admit: 2015-02-01 | Payer: BLUE CROSS/BLUE SHIELD | Source: Ambulatory Visit | Admitting: Internal Medicine

## 2015-02-01 SURGERY — LOOP RECORDER INSERTION
Anesthesia: LOCAL

## 2015-02-01 NOTE — Telephone Encounter (Signed)
Follow up     Patient returning call back to nurse from yesterday

## 2015-02-02 NOTE — Telephone Encounter (Signed)
I called and spoke with the patient. She is aware that Dr. Caryl Comes will order a brain MRI with contrast for her. The patient is agreeable and states she is ok going through an MRI scanner. She would like this done at [facility]. I advised her I would place the order for there. She is very appreciate of this.

## 2015-02-15 ENCOUNTER — Ambulatory Visit: Payer: BLUE CROSS/BLUE SHIELD

## 2015-02-16 ENCOUNTER — Ambulatory Visit (INDEPENDENT_AMBULATORY_CARE_PROVIDER_SITE_OTHER): Payer: BLUE CROSS/BLUE SHIELD | Admitting: Otolaryngology

## 2015-02-16 DIAGNOSIS — H9313 Tinnitus, bilateral: Secondary | ICD-10-CM | POA: Diagnosis not present

## 2015-02-16 DIAGNOSIS — R42 Dizziness and giddiness: Secondary | ICD-10-CM

## 2015-02-16 DIAGNOSIS — H903 Sensorineural hearing loss, bilateral: Secondary | ICD-10-CM | POA: Diagnosis not present

## 2015-02-16 DIAGNOSIS — H8103 Meniere's disease, bilateral: Secondary | ICD-10-CM | POA: Diagnosis not present

## 2015-02-17 ENCOUNTER — Other Ambulatory Visit (INDEPENDENT_AMBULATORY_CARE_PROVIDER_SITE_OTHER): Payer: Self-pay | Admitting: Otolaryngology

## 2015-02-17 DIAGNOSIS — H918X2 Other specified hearing loss, left ear: Secondary | ICD-10-CM

## 2015-02-17 DIAGNOSIS — IMO0001 Reserved for inherently not codable concepts without codable children: Secondary | ICD-10-CM

## 2015-02-21 ENCOUNTER — Ambulatory Visit (HOSPITAL_COMMUNITY)
Admission: RE | Admit: 2015-02-21 | Discharge: 2015-02-21 | Disposition: A | Discharge status: Home / Self Care | Payer: BLUE CROSS/BLUE SHIELD | Source: Ambulatory Visit | Attending: Otolaryngology | Admitting: Otolaryngology

## 2015-02-21 ENCOUNTER — Ambulatory Visit (HOSPITAL_COMMUNITY)
Admission: RE | Admit: 2015-02-21 | Discharge: 2015-02-21 | Disposition: A | Discharge status: Home / Self Care | Payer: BLUE CROSS/BLUE SHIELD | Source: Ambulatory Visit | Attending: Internal Medicine | Admitting: Internal Medicine

## 2015-02-21 DIAGNOSIS — H918X2 Other specified hearing loss, left ear: Secondary | ICD-10-CM | POA: Diagnosis present

## 2015-02-21 DIAGNOSIS — R55 Syncope and collapse: Secondary | ICD-10-CM | POA: Insufficient documentation

## 2015-02-21 DIAGNOSIS — IMO0001 Reserved for inherently not codable concepts without codable children: Secondary | ICD-10-CM

## 2015-02-21 MED ORDER — GADOBENATE DIMEGLUMINE 529 MG/ML IV SOLN
12.0000 mL | Freq: Once | INTRAVENOUS | Status: AC | PRN
  Administered 2015-02-21: 12 mL via INTRAVENOUS

## 2015-02-22 ENCOUNTER — Ambulatory Visit (HOSPITAL_COMMUNITY): Payer: BLUE CROSS/BLUE SHIELD

## 2015-02-23 ENCOUNTER — Ambulatory Visit (INDEPENDENT_AMBULATORY_CARE_PROVIDER_SITE_OTHER): Payer: BLUE CROSS/BLUE SHIELD | Admitting: Otolaryngology

## 2015-02-23 DIAGNOSIS — R42 Dizziness and giddiness: Secondary | ICD-10-CM | POA: Diagnosis not present

## 2015-02-23 DIAGNOSIS — H903 Sensorineural hearing loss, bilateral: Secondary | ICD-10-CM

## 2015-02-25 ENCOUNTER — Other Ambulatory Visit: Payer: Self-pay | Admitting: Family Medicine

## 2015-02-27 ENCOUNTER — Telehealth: Payer: Self-pay | Admitting: Internal Medicine

## 2015-02-27 NOTE — Telephone Encounter (Signed)
Ibuprofen refill denied as stated "Patient not taking"

## 2015-02-27 NOTE — Telephone Encounter (Signed)
I called the patient with her brain MRI results. I advised her this was normal. She explained to me that another MD had set her up for a CT scan of the temporal bones on the same day.  Report findings as below:  IMPRESSION: 1. Normal appearance of the left temporal bone. 2. 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 3. The right temporal bone is otherwise within normal limits.  The patient states that the findings on the CT could be causing some of her dizziness.  She is being referred to a neuro ENT in [facility]- Dr. Freddy Jaksch, to see if this can be repaired. She has been told this is very rare. She is awaiting an appointment date/ time with [facility].  I advised her I would review with Dr. Caryl Comes and see what should be done in relation to her LINQ implant. I will call her back with recommendations. She is agreeable.

## 2015-02-28 ENCOUNTER — Other Ambulatory Visit: Payer: Self-pay | Admitting: Internal Medicine

## 2015-02-28 NOTE — Telephone Encounter (Signed)
I spoke with the patient and she is aware of Dr. Olin Pia recommendations.  She is agreeable with waiting for her appointment with [facility]. She is hoping to get worked in on Friday. She will call us back after her appointment there.

## 2015-02-28 NOTE — Telephone Encounter (Signed)
We can do one of two things, the first is to await results of [facility] appt and thougths as to whether the dizziness ( and palpitations ) are related to the semi cirucular canal problem, alternatively we could assume they are separate and go ahead with the loop  I would probably favor the former,

## 2015-03-17 ENCOUNTER — Telehealth: Payer: Self-pay | Admitting: *Deleted

## 2015-03-17 ENCOUNTER — Telehealth: Payer: Self-pay | Admitting: Internal Medicine

## 2015-03-17 NOTE — Telephone Encounter (Signed)
wants amoxicillian refill before her dental procedure, showed that Dr. Burt Knack wrote a script for her back in 2013 for this, will he approve another one? please advise.

## 2015-03-17 NOTE — Telephone Encounter (Signed)
Patient had ASD closure performed in 2012, per protocol SBE is only required 6 months post closure.  Per 2015 Echo no indication for SBE. Please advise patient.  Thank you

## 2015-03-17 NOTE — Telephone Encounter (Signed)
New Message  Pt c/o medication issue: 1. Name of Medication: Amoxicillin   4. What is your medication issue? Pre med's for dental appt on 03/20/2015   . *STAT* If patient is at the pharmacy, call can be transferred to refill team.   1. Which medications need to be refilled? (please list name of each medication and dose if known) Amoxicillin   2. Which pharmacy/location (including street and city if local pharmacy) is medication to be sent to? [facility] in [place]  [phone]  3. Do they need a 30 day or 90 day supply? 30 days supply   [facility] that [facility] the rest last Friday. No one responded to the request, please call.

## 2015-03-17 NOTE — Telephone Encounter (Signed)
called and informed pt that she did not need the amoxicillian before procedure per Theodosia Quay, see notes.

## 2015-03-17 NOTE — Telephone Encounter (Signed)
amoxicillan request needs to go to PCP, LVM for pt regarding this.

## 2015-04-21 ENCOUNTER — Telehealth: Payer: Self-pay | Admitting: Internal Medicine

## 2015-04-21 ENCOUNTER — Telehealth: Payer: Self-pay

## 2015-04-21 NOTE — Telephone Encounter (Signed)
Reviewed with Ander Purpura, RN for Dr. Burt Knack.  Prophylactic SBE not required 6 months post ASD closure. I have notified the patient of this and that she no longer requires SBE. Patient verbalizes understanding. I advised her to have her dental office call with any further questions.

## 2015-04-21 NOTE — Telephone Encounter (Signed)
See previous phone encounter for today.

## 2015-04-21 NOTE — Telephone Encounter (Signed)
New message  Pt c/o medication issue: 1. Name of Medication: Amoxycilon  4. What is your medication issue? Pt request a call back to determine if it is still appropriate for her to take

## 2015-04-21 NOTE — Telephone Encounter (Signed)
Patient having upcoming dental work and wants amoxicillin before dental appointment. Amoxicillin 500 MG with 1 refill to be sent to her pharmacy--[facility] in [place].

## 2015-04-24 DIAGNOSIS — H9313 Tinnitus, bilateral: Secondary | ICD-10-CM | POA: Diagnosis not present

## 2015-04-24 DIAGNOSIS — H8311 Labyrinthine fistula, right ear: Secondary | ICD-10-CM | POA: Diagnosis not present

## 2015-04-24 DIAGNOSIS — H90A31 Mixed conductive and sensorineural hearing loss, unilateral, right ear with restricted hearing on the contralateral side: Secondary | ICD-10-CM | POA: Diagnosis not present

## 2015-04-24 DIAGNOSIS — H90A22 Sensorineural hearing loss, unilateral, left ear, with restricted hearing on the contralateral side: Secondary | ICD-10-CM | POA: Diagnosis not present

## 2015-06-08 ENCOUNTER — Ambulatory Visit (INDEPENDENT_AMBULATORY_CARE_PROVIDER_SITE_OTHER): Payer: BLUE CROSS/BLUE SHIELD | Admitting: Physician Assistant

## 2015-06-08 ENCOUNTER — Encounter: Payer: Self-pay | Admitting: Physician Assistant

## 2015-06-08 VITALS — BP 100/60 | HR 80 | Temp 98.2°F | Resp 18 | Wt 141.0 lb

## 2015-06-08 DIAGNOSIS — J029 Acute pharyngitis, unspecified: Secondary | ICD-10-CM

## 2015-06-08 DIAGNOSIS — J988 Other specified respiratory disorders: Secondary | ICD-10-CM

## 2015-06-08 DIAGNOSIS — B9689 Other specified bacterial agents as the cause of diseases classified elsewhere: Secondary | ICD-10-CM

## 2015-06-08 LAB — STREP GROUP A AG, W/REFLEX TO CULT: STREGTOCOCCUS GROUP A AG SCREEN: NOT DETECTED

## 2015-06-08 MED ORDER — AZITHROMYCIN 250 MG PO TABS: ORAL_TABLET | ORAL | Status: DC

## 2015-06-08 MED ORDER — FLUCONAZOLE 150 MG PO TABS: 150.0000 mg | ORAL_TABLET | Freq: Once | ORAL | Status: DC

## 2015-06-08 MED ORDER — HYDROCODONE-HOMATROPINE 5-1.5 MG/5ML PO SYRP: 5.0000 mL | ORAL_SOLUTION | Freq: Three times a day (TID) | ORAL | Status: DC | PRN

## 2015-06-08 NOTE — Progress Notes (Signed)
Patient ID: Candice Estrada MRN: [id number], DOB: 11-Nov-1975, 40 y.o. Date of Encounter: 06/08/2015, 2:47 PM    Chief Complaint:  Chief Complaint  Patient presents with  . sick x 4 days    ha, fever, sore throat, cough     HPI: 40 y.o. year old white female works at a [employer]. Says she "never gets sick" with this type of illness. Says she has felt horrible. Especially at night and in the mornings. Has had sore throat, nasal congestion, cough. Fever 100.2 this morning.      Home Meds:   Outpatient Prescriptions Prior to Visit  Medication Sig Dispense Refill  . aspirin EC 81 MG tablet Take 81 mg by mouth every morning.     Marland Kitchen buPROPion (WELLBUTRIN SR) 150 MG 12 hr tablet Take 1 tablet (150 mg total) by mouth 2 (two) times daily. 180 tablet 3  . ibuprofen (ADVIL,MOTRIN) 800 MG tablet TAKE 1 TABLET EVERY 8 HOURS AS NEEDED 90 tablet 2  . meclizine (ANTIVERT) 12.5 MG tablet Take 1 tablet (12.5 mg total) by mouth daily as needed for dizziness. 30 tablet 0  . Multiple Vitamin (MULTIVITAMIN WITH MINERALS) TABS Take 1 tablet by mouth every morning.     Marland Kitchen PARAGARD INTRAUTERINE COPPER IU by Intrauterine route.    . midodrine (PROAMATINE) 2.5 MG tablet TAKE 1 TABLET TWICE A DAY WITH MEALS (Patient not taking: Reported on 06/08/2015) 180 tablet 0  . fluconazole (DIFLUCAN) 150 MG tablet Take 150 mg by mouth daily.    . metroNIDAZOLE (METROGEL VAGINAL) 0.75 % vaginal gel Use 1 applicator in vagina at HS x 5 night (Patient not taking: Reported on 01/30/2015) 70 g 1  . terconazole (TERAZOL 7) 0.4 % vaginal cream Place 1 applicator vaginally at bedtime. (Patient not taking: Reported on 01/30/2015) 45 g 1   No facility-administered medications prior to visit.    Allergies: No Known Allergies    Review of Systems: See HPI for pertinent ROS. All other ROS negative.    Physical Exam: Blood pressure 100/60, pulse 80, temperature 98.2 F (36.8 C), temperature source Oral, resp. rate 18,  weight 141 lb (63.957 kg)., Body mass index is 24.98 kg/(m^2). General:  WNWD WF. Appears in no acute distress. HEENT: Normocephalic, atraumatic, eyes without discharge, sclera non-icteric, nares are without discharge. Bilateral auditory canals clear, TM's are without perforation, pearly grey and translucent with reflective cone of light bilaterally. Oral cavity moist, posterior pharynx with moderate erythema. No exudate, no peritonsillar abscess.  Neck: Supple. No thyromegaly. No lymphadenopathy. Lungs: Clear bilaterally to auscultation without wheezes, rales, or rhonchi. Breathing is unlabored. Heart: Regular rhythm. No murmurs, rubs, or gallops. Msk:  Strength and tone normal for age. Extremities/Skin: Warm and dry.  No rashes. Neuro: Alert and oriented X 3. Moves all extremities spontaneously. Gait is normal. CNII-XII grossly in tact. Psych:  Responds to questions appropriately with a normal affect.   Results for orders placed or performed in visit on 06/08/15  STREP GROUP A AG, W/REFLEX TO CULT  Result Value Ref Range   SOURCE THROAT    STREGTOCOCCUS GROUP A AG SCREEN Not Detected      ASSESSMENT AND PLAN:  40 y.o. year old female with  1. Bacterial respiratory infection She is to take abx as directed. She requests med to use for cough--has been getting no good sleep. Use Hyodan at night. F/U if symptoms do not resolve within 1 week after completion of abx. - azithromycin (ZITHROMAX) 250  MG tablet; Day 1: Take 2 daily. Days 2-5: Take 1 daily.  Dispense: 6 tablet; Refill: 0 - HYDROcodone-homatropine (HYCODAN) 5-1.5 MG/5ML syrup; Take 5 mLs by mouth every 8 (eight) hours as needed for cough.  Dispense: 120 mL; Refill: 0  2. Sorethroat - STREP GROUP A AG, W/REFLEX TO CULT   Signed, [address] [place], [place], [facility] 06/08/2015 2:47 PM

## 2015-06-20 ENCOUNTER — Telehealth: Payer: Self-pay | Admitting: Family Medicine

## 2015-06-20 DIAGNOSIS — J019 Acute sinusitis, unspecified: Secondary | ICD-10-CM | POA: Diagnosis not present

## 2015-06-20 NOTE — Telephone Encounter (Signed)
[phone] Patient is calling to say that she took zpak, and got better, now is having same symptoms again, would like a nurse to call her to advise

## 2015-06-20 NOTE — Telephone Encounter (Signed)
Spoke with patient.  She has finished her medication but is not feeling any better.  She is having a lot of trouble at night.  (patient sounded nasally on the phone).  Follow up appointment scheduled.

## 2015-06-21 ENCOUNTER — Ambulatory Visit: Payer: Self-pay | Admitting: Family Medicine

## 2015-06-26 ENCOUNTER — Telehealth: Payer: Self-pay | Admitting: Family Medicine

## 2015-06-26 DIAGNOSIS — J069 Acute upper respiratory infection, unspecified: Secondary | ICD-10-CM

## 2015-06-26 NOTE — Telephone Encounter (Signed)
Send to [facility]

## 2015-06-26 NOTE — Telephone Encounter (Signed)
[phone] leave message if she does not answer, she is at work  Patients chest is still very congested and sore, she would like to know if a chest xray can be placed by chance

## 2015-06-26 NOTE — Telephone Encounter (Signed)
Patient was supposed to have F/U with PCP, but appt was cancelled by patient.   MD please advise.

## 2015-06-27 NOTE — Telephone Encounter (Signed)
Yes, Tell pt we will order Chest XRay--find out location (she may want to go to [employer] works towards [employer] I think) Place order for [employer]. Tell her we will call her when we get Chest XRay report/result.

## 2015-06-28 ENCOUNTER — Ambulatory Visit (HOSPITAL_COMMUNITY)
Admission: RE | Admit: 2015-06-28 | Discharge: 2015-06-28 | Disposition: A | Discharge status: Home / Self Care | Payer: BLUE CROSS/BLUE SHIELD | Source: Ambulatory Visit | Attending: Physician Assistant | Admitting: Physician Assistant

## 2015-06-28 DIAGNOSIS — J069 Acute upper respiratory infection, unspecified: Secondary | ICD-10-CM | POA: Insufficient documentation

## 2015-06-28 DIAGNOSIS — R05 Cough: Secondary | ICD-10-CM | POA: Diagnosis not present

## 2015-06-28 NOTE — Telephone Encounter (Signed)
CXR ordered and pt called , left message to go have CXR done.

## 2015-07-20 ENCOUNTER — Telehealth: Payer: Self-pay | Admitting: Internal Medicine

## 2015-07-20 NOTE — Telephone Encounter (Signed)
New Message   Request for surgical clearance:  1. What type of surgery is being performed? Middle Fossa crantony  2. When is this surgery scheduled? 9/5  3. Are there any medications that need to be held prior to surgery and how long? Hold anticoag med   4. Name of physician performing surgery? John Mccelveen   5. What is your office phone and fax number? [phone] fx [phone]  Comment Christy from [facility] states to return cal to pt

## 2015-07-20 NOTE — Telephone Encounter (Signed)
Called and spoke with the surgeon's office to clarify what type of anesthesia the patient will require. Confirmed general anesthesia. The patient will need to come in for a follow up appt for surgical clearance in August- SK/ APP. Will forward to [facility] to contact the patient to schedule.

## 2015-08-10 ENCOUNTER — Telehealth: Payer: Self-pay | Admitting: Internal Medicine

## 2015-08-10 NOTE — Telephone Encounter (Signed)
Pt calling to inform Dr Caryl Comes and Nira Conn RN two things.  Pt states the first thing is to let them know that she has decided to cancel her upcoming surgery to have a middle fossa craniotomy done, due to the risk factors.  Pt states she has learned to manage her extreme dizziness, and will continue doing so until she is completely "dibilitated" from this issue.   Secondly the pt wanted to ask Dr Caryl Comes if she could go ahead and have her loop recorder placed, as previously discussed with Dr Caryl Comes.  Pt states that if he recommends waiting to discuss this more in depth with the pt at her upcoming appt in September, then that's fine too.  She's okay either way.   Informed the pt that Dr Caryl Comes and Nira Conn are out of the office the rest of the week. Informed the pt that I will route this message to both parties for further review, recommendation, and follow-up with the pt thereafter.  Pt verbalized understanding and agrees with this plan.  Pt gracious for all the assistance provided.

## 2015-08-10 NOTE — Telephone Encounter (Signed)
New Message  Pt verbalized she's returning nurses call from this week. Pt wanted nurse to know her surgery is cancelled.

## 2015-08-14 NOTE — Telephone Encounter (Signed)
Will see her in Sept Call if new issues arise

## 2015-08-18 ENCOUNTER — Ambulatory Visit: Payer: BLUE CROSS/BLUE SHIELD | Admitting: Family Medicine

## 2015-08-22 NOTE — Telephone Encounter (Signed)
Dr. Caryl Comes- her main question was about the loop recorder implant.  Do you want me to go ahead and schedule her or do you want to see her back first? Just wanted to clarify.

## 2015-08-24 NOTE — Telephone Encounter (Signed)
Thought just as well as to see her back

## 2015-08-25 NOTE — Telephone Encounter (Signed)
I left a message for the patient that per Dr. Caryl Comes, he will see her in September as scheduled and we will not pursue anything else prior to that.  I asked that she call back with any further questions.

## 2015-10-10 ENCOUNTER — Other Ambulatory Visit: Payer: Self-pay | Admitting: Family Medicine

## 2015-10-10 DIAGNOSIS — Z1231 Encounter for screening mammogram for malignant neoplasm of breast: Secondary | ICD-10-CM

## 2015-10-12 ENCOUNTER — Other Ambulatory Visit: Payer: BLUE CROSS/BLUE SHIELD | Admitting: Adult Health

## 2015-10-13 ENCOUNTER — Ambulatory Visit (INDEPENDENT_AMBULATORY_CARE_PROVIDER_SITE_OTHER): Payer: BLUE CROSS/BLUE SHIELD | Admitting: Internal Medicine

## 2015-10-13 ENCOUNTER — Encounter: Payer: Self-pay | Admitting: Internal Medicine

## 2015-10-13 VITALS — BP 115/80 | HR 89 | Ht 63.0 in | Wt 139.2 lb

## 2015-10-13 DIAGNOSIS — R42 Dizziness and giddiness: Secondary | ICD-10-CM | POA: Diagnosis not present

## 2015-10-13 DIAGNOSIS — I472 Ventricular tachycardia: Secondary | ICD-10-CM

## 2015-10-13 DIAGNOSIS — R55 Syncope and collapse: Secondary | ICD-10-CM

## 2015-10-13 DIAGNOSIS — I4729 Other ventricular tachycardia: Secondary | ICD-10-CM

## 2015-10-13 NOTE — Patient Instructions (Signed)
Medication Instructions: - Your physician recommends that you continue on your current medications as directed. Please refer to the Current Medication list given to you today.  Labwork: - none ordered  Procedures/Testing: - none ordered  Follow-Up: - Your physician recommends that you schedule a follow-up appointment in: 8-10 weeks with Dr. Caryl Comes.  Any Additional Special Instructions Will Be Listed Below (If Applicable).     If you need a refill on your cardiac medications before your next appointment, please call your pharmacy.

## 2015-10-13 NOTE — Progress Notes (Signed)
Patient Care Team: Alycia Rossetti, MD as PCP - General (Family Medicine)   HPI  Candice Estrada is a 40 y.o. female Are seen in followup for syncope in the context of a prior ASD closure  She continues to complain of palpitations which are becoming increasingly frequent. They're quite frightening at times. They're relatively brief lasting only seconds and hence has not been amenable to the AliveCor monitor. Review of her old chart demonstrates nonsustained ventricular tachycardia on her ZIO patch. In her mind these are similar.  Echocardiogram was normal. signal average ECG was normal   She struggled with exercise tachycardia. This is accompanied by shortness of breath. She describes her volume status as being replete. Her salt intake is modest.  She also has heat intolerance shower intolerance and worsening symptoms at the time of her period. However, she is not a candidate for hormonal therapy because of her prior stroke.   She has violaceous discoloration of her lower extremities with standing.  fQRS HFLA <40 RMS40  96 26 56      Past Medical History:  Diagnosis Date  . Anxiety   . ASD (atrial septal defect), ostium secundum    Status post closure 08/20/10 with Dr. Burt Knack; procedure complicated by right groin hematoma and acute blood loss anemia  . BV (bacterial vaginosis) 10/06/2014  . Complication of anesthesia    VASO-VAGAL RESPONSE IN RECOVERY ROOM AFTER HEART SURGERY  . Dysrhythmia    A-FIB  . Hematuria   . History of psoriasis    scalp  . History of TIAs   . Hx of dizziness    random episodes "because my BP is so low"  . Hypotension   . IUD (intrauterine device) in place 12/16/2012  . Kidney stone   . Meniere's disease   . Night terrors   . Ovarian cyst   . Stroke (HCC)    weakness l foot  . Thyroid enlarged 12/16/2012   History of cyst will get [place]  . Vaginal discharge 10/06/2014    Past Surgical History:  Procedure Laterality Date  . ASD REPAIR   08/20/2010  . CARDIAC SURGERY    . CYSTOSCOPY WITH RETROGRADE PYELOGRAM, URETEROSCOPY AND STENT PLACEMENT Left 08/31/2014   Procedure: CYSTOSCOPY WITH BILATERAL RETROGRADE PYELOGRAM, URETEROSCOPY AND STENT PLACEMENT, STONE EXTRACTION WITH BASKET;  Surgeon: Cleon Gustin, MD;  Location: [facility];  Service: Urology;  Laterality: Left;  . FOOT NEUROMA SURGERY  2009   left  . THYROID CYST EXCISION    . TONSILLECTOMY      Current Outpatient Prescriptions  Medication Sig Dispense Refill  . aspirin EC 81 MG tablet Take 81 mg by mouth every morning.     Marland Kitchen buPROPion (WELLBUTRIN XL) 150 MG 24 hr tablet Take 150 mg by mouth daily.    Marland Kitchen ibuprofen (ADVIL,MOTRIN) 800 MG tablet TAKE 1 TABLET EVERY 8 HOURS AS NEEDED 90 tablet 2  . meclizine (ANTIVERT) 12.5 MG tablet Take 1 tablet (12.5 mg total) by mouth daily as needed for dizziness. 30 tablet 0  . Multiple Vitamin (MULTIVITAMIN WITH MINERALS) TABS Take 1 tablet by mouth every morning.     Marland Kitchen PARAGARD INTRAUTERINE COPPER IU by Intrauterine route.     No current facility-administered medications for this visit.     No Known Allergies  Review of Systems negative except from HPI and PMH  Physical Exam BP 115/80   Pulse 89   Ht 5' 3"  (1.6 m)  Wt 139 lb 3.2 oz (63.1 kg)   SpO2 97%   BMI 24.66 kg/m  Well developed and well nourished in no acute distress HENT normal E scleral and icterus clear Neck Supple JVP flat; carotids brisk and full Clear to ausculation regular rate and rhythm, no murmurs gallops or rub Soft with active bowel sounds No clubbing cyanosis  Edema Alert and oriented, grossly normal motor and sensory function Skin Warm and Dry    Assessment and  Plan  Dizziness question related to blood pressure  VT NS noted on ZIO patch 6/15  ASD repair AS Amplatzer  POTS ? Vertigo       she's been intercurrently diagnosed with Mnire's.  She saw Dr. @  [facility] who felt that she needed a craniotomy for dehiscence of her  semicircular canal. She is not inclined at this point.  She continues with palpitations the mechanism which may be ongoing episodes of nonsustained ventricular tachycardia or potentially atrial arrhythmias related to her ASD repair. We have discussed using a Linq records; however, I have asked her to firmness distal little while as we try to address her palpitations which increasingly sound like have a strong autonomic component. 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   She's also had ongoing problems with TIAs following her ASD repair and has had positive bubble studies.  Reviewed the literature on erosion and fracture felt that it was not likely relevant  We spent more than 50% of our >25 min visit in face to face counseling regarding the above

## 2015-10-17 ENCOUNTER — Telehealth: Payer: Self-pay | Admitting: Adult Health

## 2015-10-17 DIAGNOSIS — Z8639 Personal history of other endocrine, nutritional and metabolic disease: Secondary | ICD-10-CM

## 2015-10-17 NOTE — Telephone Encounter (Signed)
Pt wants thyroid US before visit, 10/12, appt made for 10/10 at 12:30 pm  At [facility]

## 2015-10-19 ENCOUNTER — Other Ambulatory Visit: Payer: BLUE CROSS/BLUE SHIELD | Admitting: Adult Health

## 2015-10-23 ENCOUNTER — Ambulatory Visit (HOSPITAL_COMMUNITY)
Admission: RE | Admit: 2015-10-23 | Discharge: 2015-10-23 | Disposition: A | Discharge status: Home / Self Care | Payer: BLUE CROSS/BLUE SHIELD | Source: Ambulatory Visit | Attending: Adult Health | Admitting: Adult Health

## 2015-10-23 DIAGNOSIS — E042 Nontoxic multinodular goiter: Secondary | ICD-10-CM | POA: Diagnosis not present

## 2015-10-23 DIAGNOSIS — Z8639 Personal history of other endocrine, nutritional and metabolic disease: Secondary | ICD-10-CM

## 2015-10-26 ENCOUNTER — Encounter: Payer: Self-pay | Admitting: Adult Health

## 2015-10-26 ENCOUNTER — Other Ambulatory Visit: Payer: Self-pay | Admitting: *Deleted

## 2015-10-26 ENCOUNTER — Ambulatory Visit (INDEPENDENT_AMBULATORY_CARE_PROVIDER_SITE_OTHER): Payer: BLUE CROSS/BLUE SHIELD | Admitting: Adult Health

## 2015-10-26 VITALS — BP 102/68 | HR 84 | Ht 62.5 in | Wt 141.0 lb

## 2015-10-26 DIAGNOSIS — E049 Nontoxic goiter, unspecified: Secondary | ICD-10-CM | POA: Diagnosis not present

## 2015-10-26 DIAGNOSIS — R1031 Right lower quadrant pain: Secondary | ICD-10-CM | POA: Diagnosis not present

## 2015-10-26 DIAGNOSIS — Z01411 Encounter for gynecological examination (general) (routine) with abnormal findings: Secondary | ICD-10-CM | POA: Diagnosis not present

## 2015-10-26 DIAGNOSIS — F419 Anxiety disorder, unspecified: Secondary | ICD-10-CM | POA: Diagnosis not present

## 2015-10-26 DIAGNOSIS — Z975 Presence of (intrauterine) contraceptive device: Secondary | ICD-10-CM

## 2015-10-26 DIAGNOSIS — R319 Hematuria, unspecified: Secondary | ICD-10-CM

## 2015-10-26 DIAGNOSIS — Z1212 Encounter for screening for malignant neoplasm of rectum: Secondary | ICD-10-CM

## 2015-10-26 DIAGNOSIS — Z8639 Personal history of other endocrine, nutritional and metabolic disease: Secondary | ICD-10-CM

## 2015-10-26 DIAGNOSIS — Z01419 Encounter for gynecological examination (general) (routine) without abnormal findings: Secondary | ICD-10-CM

## 2015-10-26 LAB — POCT URINALYSIS DIPSTICK
GLUCOSE UA: NEGATIVE
Ketones, UA: NEGATIVE
Leukocytes, UA: NEGATIVE
Nitrite, UA: NEGATIVE
Protein, UA: NEGATIVE

## 2015-10-26 LAB — HEMOCCULT GUIAC POC 1CARD (OFFICE): Fecal Occult Blood, POC: NEGATIVE

## 2015-10-26 MED ORDER — IBUPROFEN 800 MG PO TABS: 800.0000 mg | ORAL_TABLET | Freq: Three times a day (TID) | ORAL | 2 refills | Status: DC | PRN

## 2015-10-26 MED ORDER — BUPROPION HCL ER (XL) 150 MG PO TB24: 150.0000 mg | ORAL_TABLET | Freq: Every day | ORAL | 4 refills | Status: DC

## 2015-10-26 MED ORDER — FLUCONAZOLE 150 MG PO TABS: 150.0000 mg | ORAL_TABLET | Freq: Once | ORAL | 6 refills | Status: DC

## 2015-10-26 NOTE — Progress Notes (Signed)
Patient ID: Candice Estrada, female   DOB: Jul 18, 1975, 40 y.o.   MRN: [id number] History of Present Illness:  Candice Estrada is a 40 year old white female, married in for well woman gyn exam,she had a normal pap with negative HPV 10/06/14.She has had some pain in RLQ and sex hurts at times, and has yeast at times. Has had kidney stone in the past. PCP is Dr Candice Estrada.   Current Medications, Allergies, Past Medical History, Past Surgical History, Family History and Social History were reviewed in [place] record.     Review of Systems:  Patient denies any headaches, hearing loss, fatigue, blurred vision, shortness of breath, chest pain,  problems with bowel movements, urination, or intercourse. No joint pain or mood swings. See HPI for positives.  Physical Exam:BP 102/68 (BP Location: Left Arm, Patient Position: Sitting, Cuff Size: Normal)   Pulse 84   Ht 5' 2.5" (1.588 m)   Wt 141 lb (64 kg)   LMP 09/26/2015 (Approximate)   BMI 25.38 kg/m urine trace blood General:  Well developed, well nourished, no acute distress Skin:  Warm and dry Neck:  Midline trachea, enlarged thyroid(had [place] recently), good ROM, no lymphadenopathy Lungs; Clear to auscultation bilaterally Breast:  No dominant palpable mass, retraction, or nipple discharge Cardiovascular: Regular rate and rhythm Abdomen:  Soft, non tender, no hepatosplenomegaly Pelvic:  External genitalia is normal in appearance, no lesions.  The vagina is normal in appearance,pink period like blood. Urethra has no lesions or masses. The cervix is bulbous,+IUD strings.  Uterus is felt to be normal size, shape, and contour.  No adnexal masses,RLQ  tenderness noted.Bladder is non tender, no masses felt. Rectal: Good sphincter tone, no polyps, or hemorrhoids felt.  Hemoccult negative. Extremities/musculoskeletal:  No swelling or varicosities noted, no clubbing or cyanosis Psych:  No mood changes, alert and cooperative,seems happy PHQ  2 score 0  Impression: 1. Well woman exam with routine gynecological exam   2. Thyroid enlarged   3. History of thyroid nodule   4. IUD (intrauterine device) in place   5. Anxiety   6. RLQ abdominal pain   7. Hematuria, unspecified type       Plan: Meds ordered this encounter  Medications  . buPROPion (WELLBUTRIN XL) 150 MG 24 hr tablet    Sig: Take 1 tablet (150 mg total) by mouth daily.    Dispense:  90 tablet    Refill:  4    Order Specific Question:   Supervising Provider    Answer:   Elonda Husky, LUTHER H [[id number]]  . ibuprofen (ADVIL,MOTRIN) 800 MG tablet    Sig: Take 1 tablet (800 mg total) by mouth every 8 (eight) hours as needed.    Dispense:  90 tablet    Refill:  2    Order Specific Question:   Supervising Provider    Answer:   Elonda Husky, LUTHER H [[id number]]  . fluconazole (DIFLUCAN) 150 MG tablet    Sig: Take 1 tablet (150 mg total) by mouth once.    Dispense:  1 tablet    Refill:  6    Order Specific Question:   Supervising Provider    Answer:   Florian Buff [[id number]]  Return in 1 week for GYN [place] Mammogram yearly Physical in 1 year, pap in 2019

## 2015-10-26 NOTE — Patient Instructions (Addendum)
Physical in 1 year, pap in 2019 Mammogram yearly  Pelvic Pain, Female Female pelvic pain can be caused by many different things and start from a variety of places. Pelvic pain refers to pain that is located in the lower half of the abdomen and between your hips. The pain may occur over a short period of time (acute) or may be reoccurring (chronic). The cause of pelvic pain may be related to disorders affecting the female reproductive organs (gynecologic), but it may also be related to the bladder, kidney stones, an intestinal complication, or muscle or skeletal problems. Getting help right away for pelvic pain is important, especially if there has been severe, sharp, or a sudden onset of unusual pain. It is also important to get help right away because some types of pelvic pain can be life threatening.  CAUSES  Below are only some of the causes of pelvic pain. The causes of pelvic pain can be in one of several categories.   Gynecologic.  Pelvic inflammatory disease.  Sexually transmitted infection.  Ovarian cyst or a twisted ovarian ligament (ovarian torsion).  Uterine lining that grows outside the uterus (endometriosis).  Fibroids, cysts, or tumors.  Ovulation.  Pregnancy.  Pregnancy that occurs outside the uterus (ectopic pregnancy).  Miscarriage.  Labor.  Abruption of the placenta or ruptured uterus.  Infection.  Uterine infection (endometritis).  Bladder infection.  Diverticulitis.  Miscarriage related to a uterine infection (septic abortion).  Bladder.  Inflammation of the bladder (cystitis).  Kidney stone(s).  Gastrointestinal.  Constipation.  Diverticulitis.  Neurologic.  Trauma.  Feeling pelvic pain because of mental or emotional causes (psychosomatic).  Cancers of the bowel or pelvis. EVALUATION  Your caregiver will want to take a careful history of your concerns. This includes recent changes in your health, a careful gynecologic history of your  periods (menses), and a sexual history. Obtaining your family history and medical history is also important. Your caregiver may suggest a pelvic exam. A pelvic exam will help identify the location and severity of the pain. It also helps in the evaluation of which organ system may be involved. In order to identify the cause of the pelvic pain and be properly treated, your caregiver may order tests. These tests may include:   A pregnancy test.  Pelvic ultrasonography.  An X-ray exam of the abdomen.  A urinalysis or evaluation of vaginal discharge.  Blood tests. HOME CARE INSTRUCTIONS   Only take over-the-counter or prescription medicines for pain, discomfort, or fever as directed by your caregiver.   Rest as directed by your caregiver.   Eat a balanced diet.   Drink enough fluids to make your urine clear or pale yellow, or as directed.   Avoid sexual intercourse if it causes pain.   Apply warm or cold compresses to the lower abdomen depending on which one helps the pain.   Avoid stressful situations.   Keep a journal of your pelvic pain. Write down when it started, where the pain is located, and if there are things that seem to be associated with the pain, such as food or your menstrual cycle.  Follow up with your caregiver as directed.  SEEK MEDICAL CARE IF:  Your medicine does not help your pain.  You have abnormal vaginal discharge. SEEK IMMEDIATE MEDICAL CARE IF:   You have heavy bleeding from the vagina.   Your pelvic pain increases.   You feel light-headed or faint.   You have chills.   You have pain with urination  or blood in your urine.   You have uncontrolled diarrhea or vomiting.   You have a fever or persistent symptoms for more than 3 days.  You have a fever and your symptoms suddenly get worse.   You are being physically or sexually abused.   This information is not intended to replace advice given to you by your health care provider.  Make sure you discuss any questions you have with your health care provider.   Document Released: 11/28/2003 Document Revised: 09/21/2014 Document Reviewed: 04/22/2011 Elsevier Interactive Patient Education [employer].

## 2015-10-31 ENCOUNTER — Telehealth: Payer: Self-pay | Admitting: Internal Medicine

## 2015-10-31 NOTE — Telephone Encounter (Signed)
Should be at acceptable risk for surgery

## 2015-10-31 NOTE — Telephone Encounter (Signed)
Dr. Romie Minus is calling, states pt is having several teeth extracted and would like clearance.Please call and advise

## 2015-10-31 NOTE — Telephone Encounter (Signed)
I called and spoke with Magda Paganini at Dr. Mingo Amber office to clarify type of anesthesia to be used. Per Magda Paganini, the patient will be having IV sedation. I advised I will review with Dr. Caryl Comes and be back in touch with their office.

## 2015-11-02 ENCOUNTER — Ambulatory Visit (INDEPENDENT_AMBULATORY_CARE_PROVIDER_SITE_OTHER): Payer: BLUE CROSS/BLUE SHIELD

## 2015-11-02 DIAGNOSIS — R1031 Right lower quadrant pain: Secondary | ICD-10-CM

## 2015-11-02 NOTE — Telephone Encounter (Signed)
Phone encounter faxed to Dr. Romie Minus at [phone]. Confirmation received.

## 2015-11-02 NOTE — Progress Notes (Signed)
[facility] US TA/TV: homogeneous anteverted uterus,wnl,IUD is centrally located within the endometrium,normal ov's bilat (mobile),no free fluid seen,right adnexal pain during ultrasound

## 2015-11-27 ENCOUNTER — Telehealth: Payer: Self-pay | Admitting: Adult Health

## 2015-11-27 MED ORDER — BUPROPION HCL ER (SR) 150 MG PO TB12: 150.0000 mg | ORAL_TABLET | Freq: Every day | ORAL | 4 refills | Status: DC

## 2015-11-27 NOTE — Telephone Encounter (Signed)
Got wellbutrin XL was supposed to be SR fixed it

## 2015-12-01 ENCOUNTER — Ambulatory Visit: Payer: BLUE CROSS/BLUE SHIELD

## 2015-12-04 ENCOUNTER — Ambulatory Visit (INDEPENDENT_AMBULATORY_CARE_PROVIDER_SITE_OTHER): Payer: BLUE CROSS/BLUE SHIELD | Admitting: Otolaryngology

## 2015-12-04 DIAGNOSIS — K219 Gastro-esophageal reflux disease without esophagitis: Secondary | ICD-10-CM | POA: Diagnosis not present

## 2015-12-04 DIAGNOSIS — R1312 Dysphagia, oropharyngeal phase: Secondary | ICD-10-CM | POA: Diagnosis not present

## 2015-12-04 DIAGNOSIS — D440 Neoplasm of uncertain behavior of thyroid gland: Secondary | ICD-10-CM | POA: Diagnosis not present

## 2015-12-05 ENCOUNTER — Ambulatory Visit: Payer: BLUE CROSS/BLUE SHIELD | Admitting: Adult Health

## 2015-12-06 ENCOUNTER — Encounter: Payer: Self-pay | Admitting: Adult Health

## 2015-12-14 ENCOUNTER — Encounter: Payer: Self-pay | Admitting: Internal Medicine

## 2015-12-14 ENCOUNTER — Ambulatory Visit (INDEPENDENT_AMBULATORY_CARE_PROVIDER_SITE_OTHER): Payer: BLUE CROSS/BLUE SHIELD | Admitting: Internal Medicine

## 2015-12-14 VITALS — BP 106/74 | HR 82 | Ht 63.0 in | Wt 143.0 lb

## 2015-12-14 DIAGNOSIS — I472 Ventricular tachycardia: Secondary | ICD-10-CM | POA: Diagnosis not present

## 2015-12-14 DIAGNOSIS — G90A Postural orthostatic tachycardia syndrome (POTS): Secondary | ICD-10-CM

## 2015-12-14 DIAGNOSIS — I498 Other specified cardiac arrhythmias: Secondary | ICD-10-CM

## 2015-12-14 DIAGNOSIS — R55 Syncope and collapse: Secondary | ICD-10-CM | POA: Diagnosis not present

## 2015-12-14 DIAGNOSIS — I951 Orthostatic hypotension: Secondary | ICD-10-CM

## 2015-12-14 DIAGNOSIS — R42 Dizziness and giddiness: Secondary | ICD-10-CM

## 2015-12-14 DIAGNOSIS — R000 Tachycardia, unspecified: Secondary | ICD-10-CM | POA: Diagnosis not present

## 2015-12-14 DIAGNOSIS — I4729 Other ventricular tachycardia: Secondary | ICD-10-CM

## 2015-12-14 NOTE — Progress Notes (Signed)
Patient Care Team: Alycia Rossetti, MD as PCP - General (Family Medicine)   HPI  Candice Estrada is a 40 y.o. female Are seen in followup for syncope in the context of a prior ASD closure  She continues to complain of palpitations which are becoming increasingly frequent. They're quite frightening at times. They're relatively brief lasting only seconds and hence has not been amenable to the [employer] monitor. Review of her old chart demonstrates nonsustained ventricular tachycardia on her ZIO patch. In her mind these are similar.  These are increasingly frequent, and most recently a colleague noted pallor, and HR 120s with BP 80s  Echocardiogram was normal. signal average ECG was normal  She also has heat intolerance shower intolerance and worsening symptoms at the time of her period. However, she is not a candidate for hormonal therapy because of her prior stroke.  She struggled with exercise tachycardia. This is accompanied by shortness of breath. She describes her volume status as being replete. Her salt intake is modest.  She has violaceous discoloration of her lower extremities with standing.  fQRS HFLA <40 RMS40  96 26 56      Past Medical History:  Diagnosis Date  . Anxiety   . ASD (atrial septal defect), ostium secundum    Status post closure 08/20/10 with Dr. Burt Knack; procedure complicated by right groin hematoma and acute blood loss anemia  . BV (bacterial vaginosis) 10/06/2014  . Complication of anesthesia    VASO-VAGAL RESPONSE IN RECOVERY ROOM AFTER HEART SURGERY  . Dysrhythmia    A-FIB  . Hematuria   . History of psoriasis    scalp  . History of TIAs   . Hx of dizziness    random episodes "because my BP is so low"  . Hypotension   . IUD (intrauterine device) in place 12/16/2012  . Kidney stone   . Meniere's disease   . Night terrors   . Ovarian cyst   . Stroke (HCC)    weakness l foot  . 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  Thyroid enlarged 12/16/2012   History of cyst will get [place]  . Vaginal discharge 10/06/2014    Past Surgical History:  Procedure Laterality Date  . ASD REPAIR  08/20/2010  . CARDIAC SURGERY    . CYSTOSCOPY WITH RETROGRADE PYELOGRAM, URETEROSCOPY AND STENT PLACEMENT Left 08/31/2014   Procedure: CYSTOSCOPY WITH BILATERAL RETROGRADE PYELOGRAM, URETEROSCOPY AND STENT PLACEMENT, STONE EXTRACTION WITH BASKET;  Surgeon: Cleon Gustin, MD;  Location: [facility];  Service: Urology;  Laterality: Left;  . FOOT NEUROMA SURGERY  2009   left  . kidney stone removed    . THYROID CYST EXCISION    . TONSILLECTOMY      Current Outpatient Prescriptions  Medication Sig Dispense Refill  . aspirin EC 81 MG tablet Take 81 mg by mouth every morning.     Marland Kitchen buPROPion (WELLBUTRIN SR) 150 MG 12 hr tablet Take 1 tablet (150 mg total) by mouth daily. 90 tablet 4  . ibuprofen (ADVIL,MOTRIN) 800 MG tablet Take 1 tablet (800 mg total) by mouth every 8 (eight) hours as needed. 90 tablet 2  . meclizine (ANTIVERT) 12.5 MG tablet Take 1 tablet (12.5 mg total) by mouth daily as needed for dizziness. 30 tablet 0  . Multiple Vitamin (MULTIVITAMIN WITH MINERALS) TABS Take 1 tablet by mouth every morning.     Marland Kitchen PARAGARD INTRAUTERINE COPPER IU by Intrauterine route.  No current facility-administered medications for this visit.     No Known Allergies  Review of Systems negative except from HPI and PMH  Physical Exam BP 106/74   Pulse 82   Ht 5' 3"  (1.6 m)   Wt 143 lb (64.9 kg)   SpO2 96%   BMI 25.33 kg/m  Well developed and well nourished in no acute distress HENT normal E scleral and icterus clear Neck Supple JVP flat; carotids brisk and full Clear to ausculation regular rate and rhythm, no murmurs gallops or rub Soft with active bowel sounds No clubbing cyanosis  Edema Alert and oriented, grossly normal motor and sensory function Skin Warm and Dry  ECG demonstrates sinus rhythm at 77 Intervals  15/09/40 Axis LXVIII  Assessment and  Plan  Dizziness question related to blood pressure  VT NS noted on ZIO patch 6/15  ASD repair AS Amplatzer  POTS ? Vertigo    She continues with spells associated with rapid heart rate and low blood pressure. With a history of arrhythmia, excluding arrhythmia is essential. We have access to ZIO again. She is having episodes every couple weeks. This seems like the most cost-effective strategy.  We spent more than 50% of our >25 min visit in face to face counseling regarding the above

## 2015-12-14 NOTE — Patient Instructions (Signed)
Medication Instructions:  Your physician has recommended you make the following change in your medication:  1) START Zio patch - Nira Conn will call you to setup pick-up in [place]: none  Testing/Procedures: none  Follow-Up: Your physician recommends that you schedule a follow-up appointment in: 4 weeks with Dr. Caryl Comes   Any Other Special Instructions Will Be Listed Below (If Applicable).     If you need a refill on your cardiac medications before your next appointment, please call your pharmacy.

## 2015-12-20 ENCOUNTER — Telehealth: Payer: Self-pay | Admitting: Internal Medicine

## 2015-12-20 DIAGNOSIS — R002 Palpitations: Secondary | ICD-10-CM

## 2015-12-20 NOTE — Telephone Encounter (Signed)
New message  Pt is calling about the heart monitor  Please call back and advise

## 2015-12-21 NOTE — Telephone Encounter (Signed)
Discussed with the [place] office. Will need to place order for ZIO patch and scheduling can call the patient to arrange.

## 2015-12-21 NOTE — Telephone Encounter (Signed)
Order placed. Message to Doreene Burke to please call to arrange appointment.

## 2015-12-22 ENCOUNTER — Ambulatory Visit (INDEPENDENT_AMBULATORY_CARE_PROVIDER_SITE_OTHER): Payer: BLUE CROSS/BLUE SHIELD

## 2015-12-22 ENCOUNTER — Ambulatory Visit
Admission: RE | Admit: 2015-12-22 | Discharge: 2015-12-22 | Disposition: A | Discharge status: Home / Self Care | Payer: BLUE CROSS/BLUE SHIELD | Source: Ambulatory Visit | Attending: Family Medicine | Admitting: Family Medicine

## 2015-12-22 DIAGNOSIS — R002 Palpitations: Secondary | ICD-10-CM

## 2015-12-22 DIAGNOSIS — Z1231 Encounter for screening mammogram for malignant neoplasm of breast: Secondary | ICD-10-CM

## 2016-01-11 DIAGNOSIS — R42 Dizziness and giddiness: Secondary | ICD-10-CM | POA: Diagnosis not present

## 2016-01-17 ENCOUNTER — Ambulatory Visit (HOSPITAL_COMMUNITY): Payer: BLUE CROSS/BLUE SHIELD

## 2016-01-18 ENCOUNTER — Ambulatory Visit (HOSPITAL_COMMUNITY)
Admission: RE | Admit: 2016-01-18 | Discharge: 2016-01-18 | Disposition: A | Discharge status: Home / Self Care | Payer: BLUE CROSS/BLUE SHIELD | Source: Ambulatory Visit | Attending: Family Medicine | Admitting: Family Medicine

## 2016-01-18 DIAGNOSIS — Z1231 Encounter for screening mammogram for malignant neoplasm of breast: Secondary | ICD-10-CM | POA: Insufficient documentation

## 2016-01-19 ENCOUNTER — Other Ambulatory Visit: Payer: Self-pay | Admitting: Neurology

## 2016-01-19 ENCOUNTER — Other Ambulatory Visit: Payer: Self-pay | Admitting: Family Medicine

## 2016-01-19 DIAGNOSIS — R200 Anesthesia of skin: Secondary | ICD-10-CM

## 2016-01-19 DIAGNOSIS — R208 Other disturbances of skin sensation: Secondary | ICD-10-CM | POA: Diagnosis not present

## 2016-01-19 DIAGNOSIS — G459 Transient cerebral ischemic attack, unspecified: Secondary | ICD-10-CM | POA: Diagnosis not present

## 2016-01-19 DIAGNOSIS — R928 Other abnormal and inconclusive findings on diagnostic imaging of breast: Secondary | ICD-10-CM

## 2016-01-19 DIAGNOSIS — I951 Orthostatic hypotension: Secondary | ICD-10-CM | POA: Diagnosis not present

## 2016-01-19 DIAGNOSIS — H53123 Transient visual loss, bilateral: Secondary | ICD-10-CM | POA: Diagnosis not present

## 2016-01-22 ENCOUNTER — Other Ambulatory Visit: Payer: Self-pay | Admitting: Family Medicine

## 2016-01-22 ENCOUNTER — Telehealth: Payer: Self-pay | Admitting: Internal Medicine

## 2016-01-22 DIAGNOSIS — R928 Other abnormal and inconclusive findings on diagnostic imaging of breast: Secondary | ICD-10-CM

## 2016-01-22 NOTE — Telephone Encounter (Signed)
New message   Pt verbalized that she is calling for results of monitor

## 2016-01-22 NOTE — Telephone Encounter (Signed)
The patient is aware of her results.

## 2016-01-23 ENCOUNTER — Inpatient Hospital Stay (HOSPITAL_COMMUNITY): Admission: RE | Admit: 2016-01-23 | Payer: BLUE CROSS/BLUE SHIELD | Source: Ambulatory Visit

## 2016-01-24 ENCOUNTER — Ambulatory Visit
Admission: RE | Admit: 2016-01-24 | Discharge: 2016-01-24 | Disposition: A | Discharge status: Home / Self Care | Payer: BLUE CROSS/BLUE SHIELD | Source: Ambulatory Visit | Attending: Family Medicine | Admitting: Family Medicine

## 2016-01-24 ENCOUNTER — Ambulatory Visit (HOSPITAL_COMMUNITY): Admission: RE | Admit: 2016-01-24 | Payer: BLUE CROSS/BLUE SHIELD | Source: Ambulatory Visit

## 2016-01-24 DIAGNOSIS — R928 Other abnormal and inconclusive findings on diagnostic imaging of breast: Secondary | ICD-10-CM

## 2016-01-24 DIAGNOSIS — N6321 Unspecified lump in the left breast, upper outer quadrant: Secondary | ICD-10-CM | POA: Diagnosis not present

## 2016-01-24 DIAGNOSIS — N6312 Unspecified lump in the right breast, upper inner quadrant: Secondary | ICD-10-CM | POA: Diagnosis not present

## 2016-01-29 DIAGNOSIS — G8911 Acute pain due to trauma: Secondary | ICD-10-CM | POA: Diagnosis not present

## 2016-01-30 ENCOUNTER — Ambulatory Visit: Payer: BLUE CROSS/BLUE SHIELD | Admitting: Family Medicine

## 2016-02-02 ENCOUNTER — Other Ambulatory Visit: Payer: BLUE CROSS/BLUE SHIELD

## 2016-04-03 DIAGNOSIS — S9002XA Contusion of left ankle, initial encounter: Secondary | ICD-10-CM | POA: Diagnosis not present

## 2016-04-03 DIAGNOSIS — M79672 Pain in left foot: Secondary | ICD-10-CM | POA: Diagnosis not present

## 2016-04-08 DIAGNOSIS — M25562 Pain in left knee: Secondary | ICD-10-CM | POA: Diagnosis not present

## 2016-05-31 ENCOUNTER — Ambulatory Visit: Payer: BLUE CROSS/BLUE SHIELD | Admitting: Adult Health

## 2016-10-21 ENCOUNTER — Other Ambulatory Visit (HOSPITAL_COMMUNITY)
Admission: RE | Admit: 2016-10-21 | Discharge: 2016-10-21 | Disposition: A | Discharge status: Home / Self Care | Payer: BLUE CROSS/BLUE SHIELD | Source: Ambulatory Visit | Attending: Adult Health | Admitting: Adult Health

## 2016-10-21 ENCOUNTER — Ambulatory Visit (INDEPENDENT_AMBULATORY_CARE_PROVIDER_SITE_OTHER): Payer: BLUE CROSS/BLUE SHIELD | Admitting: Adult Health

## 2016-10-21 ENCOUNTER — Encounter: Payer: Self-pay | Admitting: Adult Health

## 2016-10-21 VITALS — BP 112/60 | HR 65 | Resp 18 | Ht 62.5 in | Wt 136.0 lb

## 2016-10-21 DIAGNOSIS — Z975 Presence of (intrauterine) contraceptive device: Secondary | ICD-10-CM

## 2016-10-21 DIAGNOSIS — Z1211 Encounter for screening for malignant neoplasm of colon: Secondary | ICD-10-CM | POA: Diagnosis not present

## 2016-10-21 DIAGNOSIS — Z8639 Personal history of other endocrine, nutritional and metabolic disease: Secondary | ICD-10-CM | POA: Diagnosis not present

## 2016-10-21 DIAGNOSIS — F419 Anxiety disorder, unspecified: Secondary | ICD-10-CM | POA: Diagnosis not present

## 2016-10-21 DIAGNOSIS — Z01419 Encounter for gynecological examination (general) (routine) without abnormal findings: Secondary | ICD-10-CM | POA: Diagnosis not present

## 2016-10-21 DIAGNOSIS — E049 Nontoxic goiter, unspecified: Secondary | ICD-10-CM

## 2016-10-21 DIAGNOSIS — Z01411 Encounter for gynecological examination (general) (routine) with abnormal findings: Secondary | ICD-10-CM

## 2016-10-21 DIAGNOSIS — Z1212 Encounter for screening for malignant neoplasm of rectum: Secondary | ICD-10-CM

## 2016-10-21 LAB — HEMOCCULT GUIAC POC 1CARD (OFFICE): FECAL OCCULT BLD: NEGATIVE

## 2016-10-21 MED ORDER — BUPROPION HCL ER (SR) 150 MG PO TB12: 150.0000 mg | ORAL_TABLET | Freq: Every day | ORAL | 4 refills | Status: DC

## 2016-10-21 NOTE — Patient Instructions (Signed)
Physical 1 year Pap in 3 if normal  Mammogram yearly

## 2016-10-21 NOTE — Progress Notes (Signed)
Patient ID: Candice Estrada, female   DOB: 1975/07/08, 41 y.o.   MRN: [id number] History of Present Illness: Candice Estrada is a 41 year old white female, married in for well woman gyn exam and pap.She is working at [employer] in [place] now. PCP is Dr Candice Estrada.    Current Medications, Allergies, Past Medical History, Past Surgical History, Family History and Social History were reviewed in [facility] record.     Review of Systems: Patient denies any headaches, hearing loss, fatigue, blurred vision, shortness of breath, chest pain, abdominal pain, problems with bowel movements, urination, or intercourse. No joint pain or mood swings.    Physical Exam: BP 112/60 (BP Location: Left Arm, Patient Position: Sitting, Cuff Size: Normal)   Pulse 65   Resp 18   Ht 5' 2.5" (1.588 m)   Wt 136 lb (61.7 kg)   BMI 24.48 kg/m  General:  Well developed, well nourished, no acute distress Skin:  Warm and dry Neck:  Midline trachea, right nodule thyroid, good ROM, no lymphadenopathy Lungs; Clear to auscultation bilaterally Breast:  No dominant palpable mass, retraction, or nipple discharge,hasw fibroglandular tissue esp right.  Cardiovascular: Regular rate and rhythm Abdomen:  Soft, non tender, no hepatosplenomegaly Pelvic:  External genitalia is normal in appearance, no lesions.  The vagina is normal in appearance. Urethra has no lesions or masses. The cervix is smooth, +IUD string, pap with HPV performed.  Uterus is felt to be normal size, shape, and contour.  No adnexal masses or tenderness noted.Bladder is non tender, no masses felt. Rectal: Good sphincter tone, no polyps, or hemorrhoids felt.  Hemoccult negative. Extremities/musculoskeletal:  No swelling or varicosities noted, no clubbing or cyanosis Psych:  No mood changes, alert and cooperative,seems happy PHQ 9 score 0.  Impression: 1. Well woman exam with routine gynecological exam   2. IUD (intrauterine device) in place   3. Anxiety    4. History of thyroid nodule   5. Thyroid enlarged   6. Screening for colorectal cancer       Plan: Meds ordered this encounter  Medications  . buPROPion (WELLBUTRIN SR) 150 MG 12 hr tablet    Sig: Take 1 tablet (150 mg total) by mouth daily.    Dispense:  90 tablet    Refill:  4    Order Specific Question:   Supervising Provider    Answer:   Tania Ade H [[id number]]  Check CBC,CMP,TSH and lipids Physical 1 year Pap in 3 if normal  Mammogram yearly

## 2016-10-22 LAB — CBC
HEMATOCRIT: 41.4 % (ref 34.0–46.6)
Hemoglobin: 13.5 g/dL (ref 11.1–15.9)
MCH: 29.7 pg (ref 26.6–33.0)
MCHC: 32.6 g/dL (ref 31.5–35.7)
MCV: 91 fL (ref 79–97)
Platelets: 256 10*3/uL (ref 150–379)
RBC: 4.55 x10E6/uL (ref 3.77–5.28)
RDW: 13.7 % (ref 12.3–15.4)
WBC: 4.6 10*3/uL (ref 3.4–10.8)

## 2016-10-22 LAB — LIPID PANEL
CHOLESTEROL TOTAL: 158 mg/dL (ref 100–199)
Chol/HDL Ratio: 2.7 ratio (ref 0.0–4.4)
HDL: 59 mg/dL (ref >39)
LDL CALC: 86 mg/dL (ref 0–99)
Triglycerides: 63 mg/dL (ref 0–149)
VLDL Cholesterol Cal: 13 mg/dL (ref 5–40)

## 2016-10-22 LAB — COMPREHENSIVE METABOLIC PANEL
A/G RATIO: 1.7 (ref 1.2–2.2)
ALK PHOS: 47 IU/L (ref 39–117)
ALT: 14 IU/L (ref 0–32)
AST: 21 IU/L (ref 0–40)
Albumin: 4.3 g/dL (ref 3.5–5.5)
BILIRUBIN TOTAL: 0.5 mg/dL (ref 0.0–1.2)
BUN/Creatinine Ratio: 8 — ABNORMAL LOW (ref 9–23)
BUN: 9 mg/dL (ref 6–24)
CALCIUM: 8.9 mg/dL (ref 8.7–10.2)
CHLORIDE: 104 mmol/L (ref 96–106)
CO2: 24 mmol/L (ref 20–29)
Creatinine, Ser: 1.06 mg/dL — ABNORMAL HIGH (ref 0.57–1.00)
GFR calc Af Amer: 75 mL/min/{1.73_m2} (ref >59)
GFR, EST NON AFRICAN AMERICAN: 65 mL/min/{1.73_m2} (ref >59)
GLOBULIN, TOTAL: 2.5 g/dL (ref 1.5–4.5)
Glucose: 85 mg/dL (ref 65–99)
POTASSIUM: 4 mmol/L (ref 3.5–5.2)
SODIUM: 142 mmol/L (ref 134–144)
Total Protein: 6.8 g/dL (ref 6.0–8.5)

## 2016-10-22 LAB — CYTOLOGY - PAP
DIAGNOSIS: NEGATIVE
HPV (WINDOPATH): NOT DETECTED

## 2016-10-22 LAB — TSH: TSH: 1.86 u[IU]/mL (ref 0.450–4.500)

## 2016-11-11 DIAGNOSIS — S46211A Strain of muscle, fascia and tendon of other parts of biceps, right arm, initial encounter: Secondary | ICD-10-CM | POA: Diagnosis not present

## 2017-01-13 ENCOUNTER — Other Ambulatory Visit: Payer: Self-pay | Admitting: Family Medicine

## 2017-01-13 DIAGNOSIS — Z1231 Encounter for screening mammogram for malignant neoplasm of breast: Secondary | ICD-10-CM

## 2017-01-17 ENCOUNTER — Other Ambulatory Visit: Payer: Self-pay | Admitting: Adult Health

## 2017-01-22 ENCOUNTER — Ambulatory Visit: Payer: BLUE CROSS/BLUE SHIELD

## 2017-02-11 ENCOUNTER — Ambulatory Visit: Payer: BLUE CROSS/BLUE SHIELD

## 2017-02-14 DIAGNOSIS — M79673 Pain in unspecified foot: Secondary | ICD-10-CM | POA: Diagnosis not present

## 2017-02-17 DIAGNOSIS — H18231 Secondary corneal edema, right eye: Secondary | ICD-10-CM | POA: Diagnosis not present

## 2017-02-23 ENCOUNTER — Other Ambulatory Visit: Payer: Self-pay | Admitting: Adult Health

## 2017-02-25 DIAGNOSIS — H18231 Secondary corneal edema, right eye: Secondary | ICD-10-CM | POA: Diagnosis not present

## 2017-03-13 DIAGNOSIS — D3111 Benign neoplasm of right cornea: Secondary | ICD-10-CM | POA: Diagnosis not present

## 2017-03-13 DIAGNOSIS — C439 Malignant melanoma of skin, unspecified: Secondary | ICD-10-CM | POA: Insufficient documentation

## 2017-03-13 DIAGNOSIS — D4989 Neoplasm of unspecified behavior of other specified sites: Secondary | ICD-10-CM | POA: Insufficient documentation

## 2017-03-21 ENCOUNTER — Ambulatory Visit: Payer: BLUE CROSS/BLUE SHIELD

## 2017-03-25 ENCOUNTER — Encounter: Payer: Self-pay | Admitting: Family Medicine

## 2017-03-25 ENCOUNTER — Other Ambulatory Visit: Payer: Self-pay

## 2017-03-25 ENCOUNTER — Ambulatory Visit (INDEPENDENT_AMBULATORY_CARE_PROVIDER_SITE_OTHER): Payer: BLUE CROSS/BLUE SHIELD | Admitting: Family Medicine

## 2017-03-25 VITALS — BP 110/64 | HR 76 | Temp 98.4°F | Resp 16 | Ht 62.5 in | Wt 136.0 lb

## 2017-03-25 DIAGNOSIS — C6991 Malignant neoplasm of unspecified site of right eye: Secondary | ICD-10-CM | POA: Diagnosis not present

## 2017-03-25 DIAGNOSIS — F411 Generalized anxiety disorder: Secondary | ICD-10-CM | POA: Diagnosis not present

## 2017-03-25 DIAGNOSIS — D229 Melanocytic nevi, unspecified: Secondary | ICD-10-CM

## 2017-03-25 NOTE — Patient Instructions (Addendum)
[facility]  We will call with lab results  F/U as needed

## 2017-03-25 NOTE — Progress Notes (Signed)
   Subjective:    Patient ID: Candice Estrada, female    DOB: 1975/01/15, 42 y.o.   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     No labs      Has dark spot on left side of face near eye, [place] [facility] recommended that she have dermatology referral    Has moles all over and history of sun damage/ sun exposure     Depression/anxiety - taking wellbutrin, taking melatonin at bedtime    Chronic dizziness as needed     Review Of Systems:  GEN- denies fatigue, fever, weight loss,weakness, recent illness HEENT- denies eye drainage, change in vision, nasal discharge, CVS- denies chest pain, palpitations RESP- denies SOB, cough, wheeze ABD- denies N/V, change in stools, abd pain GU- denies dysuria, hematuria, dribbling, incontinence MSK- denies joint pain, muscle aches, injury Neuro- denies headache, dizziness, syncope, seizure activity       Objective:    BP 110/64   Pulse 76   Temp 98.4 F (36.9 C) (Oral)   Resp 16   Ht 5' 2.5" (1.588 m)   Wt 136 lb (61.7 kg)   SpO2 98%   BMI 24.48 kg/m  GEN- NAD, alert and oriented x3 HEENT- PERRL, EOMI, non injected sclera, pink conjunctiva, MMM, oropharynx clear Neck- Supple, no thyromegaly CVS- RRR, no murmur RESP-CTAB ABD-NABS,soft,NT,ND Skin- multiple moles all over, flat hyperpigmented macule left cheek, cherry angioms on back EXT- No edema Pulses- Radial2+        Assessment & Plan:      Problem List Items Addressed This Visit    None      Note: This dictation was prepared with Dragon dictation along with smaller phrase technology. Any transcriptional errors that result from this process are unintentional.

## 2017-03-26 ENCOUNTER — Encounter: Payer: Self-pay | Admitting: Family Medicine

## 2017-03-26 LAB — CBC WITH DIFFERENTIAL/PLATELET
BASOS ABS: 48 {cells}/uL (ref 0–200)
Basophils Relative: 0.8 %
EOS PCT: 1 %
Eosinophils Absolute: 60 cells/uL (ref 15–500)
HCT: 43.6 % (ref 35.0–45.0)
Hemoglobin: 14.8 g/dL (ref 11.7–15.5)
Lymphs Abs: 1272 cells/uL (ref 850–3900)
MCH: 30.8 pg (ref 27.0–33.0)
MCHC: 33.9 g/dL (ref 32.0–36.0)
MCV: 90.8 fL (ref 80.0–100.0)
MONOS PCT: 8.1 %
MPV: 9.3 fL (ref 7.5–12.5)
NEUTROS PCT: 68.9 %
Neutro Abs: 4134 cells/uL (ref 1500–7800)
PLATELETS: 258 10*3/uL (ref 140–400)
RBC: 4.8 10*6/uL (ref 3.80–5.10)
RDW: 12.2 % (ref 11.0–15.0)
TOTAL LYMPHOCYTE: 21.2 %
WBC mixed population: 486 cells/uL (ref 200–950)
WBC: 6 10*3/uL (ref 3.8–10.8)

## 2017-03-26 LAB — COMPREHENSIVE METABOLIC PANEL
AG RATIO: 1.8 (calc) (ref 1.0–2.5)
ALT: 13 U/L (ref 6–29)
AST: 18 U/L (ref 10–30)
Albumin: 4.4 g/dL (ref 3.6–5.1)
Alkaline phosphatase (APISO): 39 U/L (ref 33–115)
BUN: 13 mg/dL (ref 7–25)
CO2: 27 mmol/L (ref 20–32)
CREATININE: 0.92 mg/dL (ref 0.50–1.10)
Calcium: 9.2 mg/dL (ref 8.6–10.2)
Chloride: 105 mmol/L (ref 98–110)
GLUCOSE: 83 mg/dL (ref 65–99)
Globulin: 2.5 g/dL (calc) (ref 1.9–3.7)
Potassium: 4.3 mmol/L (ref 3.5–5.3)
Sodium: 139 mmol/L (ref 135–146)
Total Bilirubin: 0.4 mg/dL (ref 0.2–1.2)
Total Protein: 6.9 g/dL (ref 6.1–8.1)

## 2017-03-26 LAB — HIV ANTIBODY (ROUTINE TESTING W REFLEX): HIV 1&2 Ab, 4th Generation: NONREACTIVE

## 2017-03-26 NOTE — Assessment & Plan Note (Signed)
Continue wellbutrin has been stable on this for years

## 2017-04-02 ENCOUNTER — Ambulatory Visit: Payer: BLUE CROSS/BLUE SHIELD

## 2017-04-02 ENCOUNTER — Ambulatory Visit
Admission: RE | Admit: 2017-04-02 | Discharge: 2017-04-02 | Disposition: A | Discharge status: Home / Self Care | Payer: BLUE CROSS/BLUE SHIELD | Source: Ambulatory Visit | Attending: Family Medicine | Admitting: Family Medicine

## 2017-04-02 DIAGNOSIS — Z1231 Encounter for screening mammogram for malignant neoplasm of breast: Secondary | ICD-10-CM | POA: Diagnosis not present

## 2017-04-10 DIAGNOSIS — H119 Unspecified disorder of conjunctiva: Secondary | ICD-10-CM | POA: Diagnosis not present

## 2017-04-16 ENCOUNTER — Telehealth: Payer: Self-pay | Admitting: *Deleted

## 2017-04-16 DIAGNOSIS — E041 Nontoxic single thyroid nodule: Secondary | ICD-10-CM

## 2017-04-16 NOTE — Telephone Encounter (Signed)
Pt wants thyroid US, has nodule, scheduled thyroid US 4/8 at 1;30 pm at [facility], has been dx with ocular cancer

## 2017-04-18 ENCOUNTER — Ambulatory Visit (HOSPITAL_COMMUNITY)
Admission: RE | Admit: 2017-04-18 | Discharge: 2017-04-18 | Disposition: A | Discharge status: Home / Self Care | Payer: BLUE CROSS/BLUE SHIELD | Source: Ambulatory Visit | Attending: Adult Health | Admitting: Adult Health

## 2017-04-18 DIAGNOSIS — E042 Nontoxic multinodular goiter: Secondary | ICD-10-CM | POA: Diagnosis not present

## 2017-04-18 DIAGNOSIS — E041 Nontoxic single thyroid nodule: Secondary | ICD-10-CM | POA: Diagnosis not present

## 2017-04-21 ENCOUNTER — Telehealth: Payer: Self-pay | Admitting: *Deleted

## 2017-04-21 ENCOUNTER — Ambulatory Visit (HOSPITAL_COMMUNITY): Payer: BLUE CROSS/BLUE SHIELD

## 2017-04-21 NOTE — Telephone Encounter (Signed)
Pt aware thyroid US shows small nodule and it is stable, does not meet biopsy criteria

## 2017-05-07 DIAGNOSIS — D489 Neoplasm of uncertain behavior, unspecified: Secondary | ICD-10-CM | POA: Diagnosis not present

## 2017-05-07 DIAGNOSIS — L570 Actinic keratosis: Secondary | ICD-10-CM | POA: Diagnosis not present

## 2017-05-07 DIAGNOSIS — D485 Neoplasm of uncertain behavior of skin: Secondary | ICD-10-CM | POA: Diagnosis not present

## 2017-05-07 DIAGNOSIS — L858 Other specified epidermal thickening: Secondary | ICD-10-CM | POA: Diagnosis not present

## 2017-05-30 DIAGNOSIS — S93491A Sprain of other ligament of right ankle, initial encounter: Secondary | ICD-10-CM | POA: Diagnosis not present

## 2017-06-17 DIAGNOSIS — D4989 Neoplasm of unspecified behavior of other specified sites: Secondary | ICD-10-CM | POA: Diagnosis not present

## 2017-07-06 ENCOUNTER — Emergency Department (HOSPITAL_COMMUNITY)
Admission: EM | Admit: 2017-07-06 | Discharge: 2017-07-06 | Disposition: A | Discharge status: Home / Self Care | Payer: BLUE CROSS/BLUE SHIELD | Attending: Emergency Medicine | Admitting: Emergency Medicine

## 2017-07-06 ENCOUNTER — Emergency Department (HOSPITAL_COMMUNITY): Payer: BLUE CROSS/BLUE SHIELD

## 2017-07-06 ENCOUNTER — Encounter (HOSPITAL_COMMUNITY): Payer: Self-pay | Admitting: Emergency Medicine

## 2017-07-06 ENCOUNTER — Other Ambulatory Visit: Payer: Self-pay

## 2017-07-06 DIAGNOSIS — Z8673 Personal history of transient ischemic attack (TIA), and cerebral infarction without residual deficits: Secondary | ICD-10-CM | POA: Insufficient documentation

## 2017-07-06 DIAGNOSIS — R1013 Epigastric pain: Secondary | ICD-10-CM

## 2017-07-06 DIAGNOSIS — R42 Dizziness and giddiness: Secondary | ICD-10-CM | POA: Insufficient documentation

## 2017-07-06 DIAGNOSIS — Z87442 Personal history of urinary calculi: Secondary | ICD-10-CM | POA: Diagnosis not present

## 2017-07-06 DIAGNOSIS — R319 Hematuria, unspecified: Secondary | ICD-10-CM | POA: Insufficient documentation

## 2017-07-06 DIAGNOSIS — R0789 Other chest pain: Secondary | ICD-10-CM | POA: Diagnosis not present

## 2017-07-06 DIAGNOSIS — Z79899 Other long term (current) drug therapy: Secondary | ICD-10-CM | POA: Diagnosis not present

## 2017-07-06 DIAGNOSIS — R0602 Shortness of breath: Secondary | ICD-10-CM | POA: Diagnosis not present

## 2017-07-06 DIAGNOSIS — R109 Unspecified abdominal pain: Secondary | ICD-10-CM | POA: Diagnosis not present

## 2017-07-06 LAB — URINALYSIS, ROUTINE W REFLEX MICROSCOPIC
Bacteria, UA: NONE SEEN
Bilirubin Urine: NEGATIVE
Glucose, UA: NEGATIVE mg/dL
Ketones, ur: NEGATIVE mg/dL
LEUKOCYTES UA: NEGATIVE
NITRITE: NEGATIVE
Protein, ur: NEGATIVE mg/dL
SPECIFIC GRAVITY, URINE: 1.023 (ref 1.005–1.030)
pH: 6 (ref 5.0–8.0)

## 2017-07-06 LAB — COMPREHENSIVE METABOLIC PANEL
ALT: 16 U/L (ref 14–54)
ANION GAP: 8 (ref 5–15)
AST: 19 U/L (ref 15–41)
Albumin: 4.3 g/dL (ref 3.5–5.0)
Alkaline Phosphatase: 43 U/L (ref 38–126)
BILIRUBIN TOTAL: 0.7 mg/dL (ref 0.3–1.2)
BUN: 10 mg/dL (ref 6–20)
CO2: 23 mmol/L (ref 22–32)
Calcium: 9.2 mg/dL (ref 8.9–10.3)
Chloride: 106 mmol/L (ref 101–111)
Creatinine, Ser: 0.87 mg/dL (ref 0.44–1.00)
GFR calc Af Amer: >60 mL/min (ref >60)
Glucose, Bld: 101 mg/dL — ABNORMAL HIGH (ref 65–99)
POTASSIUM: 3.2 mmol/L — AB (ref 3.5–5.1)
Sodium: 137 mmol/L (ref 135–145)
TOTAL PROTEIN: 7.4 g/dL (ref 6.5–8.1)

## 2017-07-06 LAB — CBC WITH DIFFERENTIAL/PLATELET
Basophils Absolute: 0 10*3/uL (ref 0.0–0.1)
Basophils Relative: 0 %
Eosinophils Absolute: 0.1 10*3/uL (ref 0.0–0.7)
Eosinophils Relative: 1 %
HEMATOCRIT: 44 % (ref 36.0–46.0)
Hemoglobin: 14.5 g/dL (ref 12.0–15.0)
Lymphocytes Relative: 25 %
Lymphs Abs: 2.1 10*3/uL (ref 0.7–4.0)
MCH: 30.3 pg (ref 26.0–34.0)
MCHC: 33 g/dL (ref 30.0–36.0)
MCV: 91.9 fL (ref 78.0–100.0)
MONO ABS: 0.7 10*3/uL (ref 0.1–1.0)
Monocytes Relative: 8 %
Neutro Abs: 5.4 10*3/uL (ref 1.7–7.7)
Neutrophils Relative %: 66 %
Platelets: 233 10*3/uL (ref 150–400)
RBC: 4.79 MIL/uL (ref 3.87–5.11)
RDW: 13.2 % (ref 11.5–15.5)
WBC: 8.3 10*3/uL (ref 4.0–10.5)

## 2017-07-06 LAB — POC URINE PREG, ED: PREG TEST UR: NEGATIVE

## 2017-07-06 LAB — LIPASE, BLOOD: LIPASE: 33 U/L (ref 11–51)

## 2017-07-06 LAB — TROPONIN I: Troponin I: <0.03 ng/mL (ref <0.03)

## 2017-07-06 MED ORDER — METOCLOPRAMIDE HCL 10 MG PO TABS
10.0000 mg | ORAL_TABLET | Freq: Once | ORAL | Status: AC
  Administered 2017-07-06: 10 mg via ORAL
  Filled 2017-07-06: qty 1

## 2017-07-06 MED ORDER — IOPAMIDOL (ISOVUE-300) INJECTION 61%: 30.0000 mL | Freq: Once | INTRAVENOUS | Status: DC | PRN

## 2017-07-06 MED ORDER — MORPHINE SULFATE (PF) 4 MG/ML IV SOLN: 4.0000 mg | Freq: Once | INTRAVENOUS | Status: DC

## 2017-07-06 MED ORDER — MORPHINE SULFATE (PF) 4 MG/ML IV SOLN
4.0000 mg | Freq: Once | INTRAVENOUS | Status: AC
  Administered 2017-07-06: 4 mg via INTRAVENOUS
  Filled 2017-07-06: qty 1

## 2017-07-06 MED ORDER — IOPAMIDOL (ISOVUE-300) INJECTION 61%
100.0000 mL | Freq: Once | INTRAVENOUS | Status: AC | PRN
  Administered 2017-07-06: 100 mL via INTRAVENOUS

## 2017-07-06 MED ORDER — MORPHINE SULFATE (PF) 2 MG/ML IV SOLN
2.0000 mg | Freq: Once | INTRAVENOUS | Status: AC
  Administered 2017-07-06: 2 mg via INTRAVENOUS
  Filled 2017-07-06: qty 1

## 2017-07-06 MED ORDER — METOCLOPRAMIDE HCL 10 MG PO TABS: 10.0000 mg | ORAL_TABLET | Freq: Four times a day (QID) | ORAL | 0 refills | Status: DC | PRN

## 2017-07-06 MED ORDER — ONDANSETRON HCL 4 MG/2ML IJ SOLN
4.0000 mg | Freq: Once | INTRAMUSCULAR | Status: AC
  Administered 2017-07-06: 4 mg via INTRAVENOUS
  Filled 2017-07-06: qty 2

## 2017-07-06 NOTE — ED Notes (Signed)
Pt states she is supposed to have cancerous tumor removed from eye at [facility] in the am.

## 2017-07-06 NOTE — ED Provider Notes (Signed)
Medical screening examination/treatment/procedure(s) were conducted as a shared visit with non-physician practitioner(s) and myself.  I personally evaluated the patient during the encounter.  Clinical Impression:   Final diagnoses:  None    The patient is a 42 year old female, no prior abdominal surgical history, she drinks occasional alcohol but is never had pancreatitis or gallbladder disease or appendicitis.  She does have a prior history of a small stroke because of the patent foramen ovale but this was fixed.  She reports that she was out in the yard when she became dizzy doing yard work, she then started to feel some intermittent abdominal cramping which came on quite acutely and has been intermittent since that time.  This is in the supraumbilical and epigastric area.  On my exam she has a very soft abdomen which is nontender except for that area where she is tender and guarding.  She does not have any peritoneal signs, no Murphy sign, no pain at McBurney's point.  She has no CVA tenderness, clear heart and lung sounds without any tachycardia.  Labs unremarkable, the patient will need to have a CT scan to further rule out anything else.  This does not appear to be pancreatitis or gallbladder disease, could be peptic ulcer disease though that seems unlikely as well.  She does have a history of kidney stones but  with reproducible tenderness that seems unlikely as well.  The patient is agreeable to the plan, she did get better with some intravenous medications   Noemi Chapel, MD 07/06/17 [id number]

## 2017-07-06 NOTE — ED Triage Notes (Signed)
Pt c/o sharp abd pain that started approx. 1 hour ago. Pt denies any n/v/d.

## 2017-07-06 NOTE — ED Provider Notes (Signed)
[facility] EMERGENCY DEPARTMENT Provider Note   CSN: [id number] Arrival date & time: 07/06/17  1930     History   Chief Complaint Chief Complaint  Patient presents with  . Abdominal Pain    HPI Candice Estrada is a 42 y.o. female presenting for an acute onset of epigastric abdominal pain today.  Patient states that epigastric pain began 1 hour ago and is a sharp, stabbing constant pain in her epigastric area.  Patient has not taken anything for her abdominal pain and states that it is unchanged since it began.   Associated symptom: Patient states that approximately 2 hours prior to onset of epigastric pain she was mowing the lawn became dizzy sat down and had approximately 10 minutes of central, sharp chest pain.  Patient states that pain went away on its own without medication.  Patient states that she has this chest pain occasionally for multiple years but has not been evaluated for it.  Patient states that she does not have chest pain at this time.  Patient endorses shortness of breath and states that when she takes a deep breath it causes her epigastric pain to worsen. Tonight she saw blood in her urine, patient has a history of kidney stones, her last one being approximately 2 years ago. Patient denies history of abdominal surgeries. Patient denies nausea/vomiting/diarrhea, recent fever, recent cough or illness. Of note patient is scheduled for surgery of her right eye tomorrow at [facility] for removal of a squamous neoplasia.  HPI  Past Medical History:  Diagnosis Date  . Anxiety   . ASD (atrial septal defect), ostium secundum    Status post closure 08/20/10 with Dr. Burt Knack; procedure complicated by right groin hematoma and acute blood loss anemia  . BV (bacterial vaginosis) 10/06/2014  . Complication of anesthesia    VASO-VAGAL RESPONSE IN RECOVERY ROOM AFTER HEART SURGERY  . Dysrhythmia    A-FIB  . Hematuria   . History of psoriasis    scalp  . History of TIAs   .  Hx of dizziness    random episodes "because my BP is so low"  . Hypotension   . IUD (intrauterine device) in place 12/16/2012  . Kidney stone   . Meniere's disease   . Night terrors   . Ovarian cyst   . Stroke (HCC)    weakness l foot  . 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 Thyroid enlarged 12/16/2012   History of cyst will get [place]  . Vaginal discharge 10/06/2014    Patient Active Problem List   Diagnosis Date Noted  . History of thyroid nodule 10/21/2016  . Vaginal discharge 10/06/2014  . BV (bacterial vaginosis) 10/06/2014  . Ovarian cyst 01/26/2014  . Pelvic pain in female 01/19/2014  . Anxiety 01/19/2014  . Fecal occult blood test positive 01/19/2014  . Night terrors, adult 02/23/2013  . IUD (intrauterine device) in place 12/16/2012  . Thyroid enlarged 12/16/2012  . Well woman exam with routine gynecological exam 09/09/2012  . Irregular bleeding 08/03/2012  . Syncope and collapse 04/26/2011  . Fatigue 02/06/2011  . POTS (postural orthostatic tachycardia syndrome) 09/15/2010  . TIA (transient ischemic attack) 09/15/2010  . Dizziness 09/10/2010  . Hypotension 09/10/2010  . Palpitations 09/06/2010  . Atrial septal defect, secundum 08/10/2010  . OVARIAN CYST, RIGHT 12/28/2007  . Anxiety state 12/03/2005    Past Surgical History:  Procedure Laterality Date  . ASD REPAIR  08/20/2010  . CARDIAC SURGERY    .  CYSTOSCOPY WITH RETROGRADE PYELOGRAM, URETEROSCOPY AND STENT PLACEMENT Left 08/31/2014   Procedure: CYSTOSCOPY WITH BILATERAL RETROGRADE PYELOGRAM, URETEROSCOPY AND STENT PLACEMENT, STONE EXTRACTION WITH BASKET;  Surgeon: Cleon Gustin, MD;  Location: [facility];  Service: Urology;  Laterality: Left;  . FOOT NEUROMA SURGERY  2009   left  . kidney stone removed    . THYROID CYST EXCISION    . TONSILLECTOMY       OB History    Gravida  3   Para  2   Term      Preterm      AB  1   Living  2     SAB  1   TAB      Ectopic       Multiple      Live Births  2            Home Medications    Prior to Admission medications   Medication Sig Start Date End Date Taking? Authorizing Provider  buPROPion [facility] SR) 150 MG 12 hr tablet TAKE 1 TABLET DAILY 02/24/17   Derrek Monaco A, NP  ibuprofen (ADVIL,MOTRIN) 800 MG tablet Take 1 tablet (800 mg total) by mouth every 8 (eight) hours as needed. 10/26/15   Estill Dooms, NP  INTERFERON ALFA-2B IJ Apply to eye. 03/24/17   [provider]  meclizine (ANTIVERT) 12.5 MG tablet Take 1 tablet (12.5 mg total) by mouth daily as needed for dizziness. 07/13/14   Deboraha Sprang, MD  metoCLOPramide (REGLAN) 10 MG tablet Take 1 tablet (10 mg total) by mouth every 6 (six) hours as needed for up to 5 days for nausea. 07/06/17 07/11/17  Nuala Alpha A, PA-C  Multiple Vitamin (MULTIVITAMIN WITH MINERALS) TABS Take 1 tablet by mouth every morning.     [provider]  PARAGARD INTRAUTERINE COPPER IU by Intrauterine route.    [provider]    Family History Family History  Problem Relation Age of Onset  . Hypertension Father 46  . Stroke Father   . Heart attack Father   . Brain cancer Father   . Heart disease Father   . Diabetes Father   . Liver disease Mother 52       dead  . Cancer Mother        breast  . Hypertension Brother 37  . Cancer Paternal Grandmother        breast  . Dementia Maternal Grandmother   . Cancer Maternal Grandmother        thyroid  . Cancer Paternal Aunt        breast   . Kidney failure Daughter   . Cancer Other        mom's grandma-breast    Social History Social History   Tobacco Use  . Smoking status: Never Smoker  . Smokeless tobacco: Never Used  Substance Use Topics  . Alcohol use: Yes    Alcohol/week: 0.0 oz    Comment: very rarely  . Drug use: No     Allergies   Patient has no known allergies.   Review of Systems Review of Systems  Constitutional: Negative.  Negative for chills,  fatigue and fever.  HENT: Negative.  Negative for congestion, ear pain, rhinorrhea, sore throat and trouble swallowing.   Eyes: Negative.  Negative for visual disturbance.  Respiratory: Positive for shortness of breath. Negative for cough and chest tightness.   Cardiovascular: Positive for chest pain. Negative for leg swelling.  Gastrointestinal: Positive  for abdominal pain. Negative for blood in stool, diarrhea, nausea and vomiting.  Genitourinary: Negative.  Negative for dysuria, flank pain, hematuria, pelvic pain, vaginal bleeding and vaginal discharge.  Musculoskeletal: Negative.  Negative for arthralgias, myalgias and neck pain.  Skin: Negative.  Negative for rash.  Neurological: Positive for dizziness. Negative for syncope, weakness, light-headedness and headaches.     Physical Exam Updated Vital Signs BP 108/76   Pulse 68   Temp 98 F (36.7 C) (Oral)   Resp 17   SpO2 98%   Physical Exam  Constitutional: She is oriented to person, place, and time. She appears well-developed and well-nourished.  Non-toxic appearance. She appears distressed.  HENT:  Head: Normocephalic and atraumatic.  Mouth/Throat: Oropharynx is clear and moist. No oropharyngeal exudate.  Eyes: Pupils are equal, round, and reactive to light.  Neck: Normal range of motion. Neck supple.  Cardiovascular: Normal rate, regular rhythm, normal heart sounds and intact distal pulses. Exam reveals no gallop and no friction rub.  No murmur heard. Pulmonary/Chest: Effort normal and breath sounds normal. No respiratory distress. She has no rhonchi. She has no rales.  Abdominal: Soft. Normal appearance and bowel sounds are normal. There is tenderness in the epigastric area. There is CVA tenderness. There is no rebound, no guarding, no tenderness at McBurney's point and negative Murphy's sign.  Neurological: She is alert and oriented to person, place, and time.  Skin: Skin is dry.  Psychiatric: She has a normal mood and  affect. Her behavior is normal.     ED Treatments / Results  Labs (all labs ordered are listed, but only abnormal results are displayed) Labs Reviewed  COMPREHENSIVE METABOLIC PANEL - Abnormal; Notable for the following components:      Result Value   Potassium 3.2 (*)    Glucose, Bld 101 (*)    All other components within normal limits  URINALYSIS, ROUTINE W REFLEX MICROSCOPIC - Abnormal; Notable for the following components:   APPearance HAZY (*)    Hgb urine dipstick SMALL (*)    All other components within normal limits  CBC WITH DIFFERENTIAL/PLATELET  LIPASE, BLOOD  TROPONIN I  POC URINE PREG, ED    EKG EKG Interpretation  Date/Time:  Sunday July 06 2017 20:43:08 EDT Ventricular Rate:  76 PR Interval:    QRS Duration: 88 QT Interval:  394 QTC Calculation: 443 R Axis:   85 Text Interpretation:  Sinus rhythm Normal ECG since last tracing no significant change Confirmed by Noemi Chapel [phone]) on 07/06/2017 8:49:47 PM   Radiology Ct Abdomen Pelvis W Contrast  Result Date: 07/06/2017 CLINICAL DATA:  42 y/o F; abdominal pain, gastroenteritis or colitis suspected EXAM: CT ABDOMEN AND PELVIS WITH CONTRAST TECHNIQUE: Multidetector CT imaging of the abdomen and pelvis was performed using the standard protocol following bolus administration of intravenous contrast. CONTRAST:  100 cc Isovue 300 COMPARISON:  08/08/2014 abdomen MRI. 07/25/2014 CT abdomen and pelvis. FINDINGS: Lower chest: No acute abnormality. Hepatobiliary: No focal liver abnormality is seen. No gallstones, gallbladder wall thickening, or biliary dilatation. Pancreas: Unremarkable. No pancreatic ductal dilatation or surrounding inflammatory changes. Spleen: Normal in size without focal abnormality. Adrenals/Urinary Tract: Adrenal glands are unremarkable. Stable right interpolar kidney 1.2 cm cyst better characterized on prior abdominal MRI. Multiple 2-3 mm nonobstructing stones in the left kidney. No hydronephrosis.  Normal bladder. Stomach/Bowel: Stomach is within normal limits. Appendix appears normal. Nonspecific mildly prominent and fluid-filled loops of small bowel in the left hemiabdomen. No findings of obstruction. Normal appearance  of the colon and stomach. Vascular/Lymphatic: No significant vascular findings are present. No enlarged abdominal or pelvic lymph nodes. Reproductive: IUD well seated within the uterine fundus. Stable 11 mm cervical nabothian cyst. Other: No abdominal wall hernia or abnormality. No abdominopelvic ascites. Musculoskeletal: No acute or significant osseous findings. IMPRESSION: 1. Nonspecific mildly prominent fluid-filled loops of small bowel in the left hemiabdomen, possibly enteritis. 2. Multiple stable nonobstructing punctate stones in the left kidney. 3. IUD well seated within the uterine fundus. Electronically Signed   By: Kristine Garbe M.D.   On: 07/06/2017 21:59   Dg Chest Port 1 View  Result Date: 07/06/2017 CLINICAL DATA:  Shortness of breath. EXAM: PORTABLE CHEST 1 VIEW COMPARISON:  Radiographs of May 28, 2015. FINDINGS: The heart size and mediastinal contours are within normal limits. Both lungs are clear. No pneumothorax or pleural effusion is noted. The visualized skeletal structures are unremarkable. IMPRESSION: No acute cardiopulmonary abnormality seen. Electronically Signed   By: Marijo Conception, M.D.   On: 07/06/2017 20:49    Procedures Procedures (including critical care time)  Medications Ordered in ED Medications  iopamidol (ISOVUE-300) 61 % injection 30 mL ( Oral Canceled Entry 07/06/17 2107)  ondansetron (ZOFRAN) injection 4 mg (4 mg Intravenous Given 07/06/17 2010)  morphine 2 MG/ML injection 2 mg (2 mg Intravenous Given 07/06/17 2010)  morphine 4 MG/ML injection 4 mg (4 mg Intravenous Given 07/06/17 2105)  iopamidol (ISOVUE-300) 61 % injection 100 mL (100 mLs Intravenous Contrast Given 07/06/17 2119)  metoCLOPramide (REGLAN) tablet 10 mg (10 mg Oral  Given 07/06/17 2229)     Initial Impression / Assessment and Plan / ED Course  I have reviewed the triage vital signs and the nursing notes.  Pertinent labs & imaging results that were available during my care of the patient were reviewed by me and considered in my medical decision making (see chart for details).  Clinical Course as of Jul 06 2233  Sun Jul 06, 2017  2223 Patient requesting 1 dose of Reglan prior to discharge.  Medication ordered.   [BM]    Clinical Course User Index [BM] Deliah Boston, PA-C    Patient with abdominal pain treated in department with morphine and Zofran.  CT abdomen pelvis suggest possible enteritis otherwise no acute findings. Patient is nontoxic, nonseptic appearing, in no apparent distress.  Patient's pain and other symptoms adequately managed in emergency department.  Labs, imaging and vitals reviewed.  Patient does not meet the SIRS or Sepsis criteria.  On repeat exam patient does not have a surgical abdomen and there are no peritoneal signs.  No indication of appendicitis, bowel obstruction, bowel perforation, cholecystitis, diverticulitis, PID or ectopic pregnancy.  Patient discharged home with symptomatic treatment and given strict instructions for follow-up with their primary care physician.  I have also discussed reasons to return immediately to the ER.  Patient expresses understanding and agrees with plan.  Chest pain is not likely of cardiac or pulmonary etiology d/t presentation, perc negative, VSS, no tracheal deviation, no JVD or new murmur, RRR, breath sounds equal bilaterally, EKG without acute abnormalities, negative troponin, and negative CXR.  Patient she has had his chest pain periodically for multiple years, patient advised to return to the ED is CP becomes exertional, associated with diaphoresis or nausea, radiates to left jaw/arm, worsens or becomes concerning in any way. Pt appears reliable for follow up and is agreeable to discharge.   Patient has a heart score of 0.  She states that  she will follow-up with her primary care provider for this as well.  Patient and family state understanding of return precautions and are agreeable to plan to discharge.  Case has been discussed with and seen by Dr. Sabra Heck who agrees with the above plan to discharge.     Final Clinical Impressions(s) / ED Diagnoses   Final diagnoses:  Epigastric pain    ED Discharge Orders        Ordered    metoCLOPramide (REGLAN) 10 MG tablet  Every 6 hours PRN     07/06/17 2213       Gari Crown 07/06/17 2237    Noemi Chapel, MD 07/06/17 [phone]

## 2017-07-06 NOTE — Discharge Instructions (Signed)
Patient to follow-up with your primary care provider in the morning. Take Reglan as prescribed for nausea. Over-the-counter Tylenol for pain as needed. Return to the emergency department for any new or worsening symptoms.  Contact a doctor if: Your belly pain changes or gets worse. You are not hungry, or you lose weight without trying. You are having trouble pooping (constipated) or have watery poop (diarrhea) for more than 2-3 days. You have pain when you pee or poop. Your belly pain wakes you up at night. Your pain gets worse with meals, after eating, or with certain foods. You are throwing up and cannot keep anything down. You have a fever. Get help right away if: Your pain does not go away as soon as your doctor says it should. You cannot stop throwing up. Your pain is only in areas of your belly, such as the right side or the left lower part of the belly. You have bloody or black poop, or poop that looks like tar. You have very bad pain, cramping, or bloating in your belly. You have signs of not having enough fluid or water in your body (dehydration), such as: Dark pee, very little pee, or no pee. Cracked lips. Dry mouth. Sunken eyes. Sleepiness. Weakness.

## 2017-07-07 DIAGNOSIS — R1033 Periumbilical pain: Secondary | ICD-10-CM | POA: Diagnosis not present

## 2017-07-07 DIAGNOSIS — R1013 Epigastric pain: Secondary | ICD-10-CM | POA: Diagnosis not present

## 2017-07-07 DIAGNOSIS — D72829 Elevated white blood cell count, unspecified: Secondary | ICD-10-CM | POA: Diagnosis not present

## 2017-07-07 DIAGNOSIS — R1031 Right lower quadrant pain: Secondary | ICD-10-CM | POA: Diagnosis not present

## 2017-07-07 DIAGNOSIS — R10814 Left lower quadrant abdominal tenderness: Secondary | ICD-10-CM | POA: Diagnosis not present

## 2017-07-07 DIAGNOSIS — R1032 Left lower quadrant pain: Secondary | ICD-10-CM | POA: Diagnosis not present

## 2017-07-07 DIAGNOSIS — R10815 Periumbilic abdominal tenderness: Secondary | ICD-10-CM | POA: Diagnosis not present

## 2017-07-07 DIAGNOSIS — R10813 Right lower quadrant abdominal tenderness: Secondary | ICD-10-CM | POA: Diagnosis not present

## 2017-07-07 DIAGNOSIS — R10816 Epigastric abdominal tenderness: Secondary | ICD-10-CM | POA: Diagnosis not present

## 2017-07-08 ENCOUNTER — Encounter (HOSPITAL_COMMUNITY): Payer: Self-pay | Admitting: Emergency Medicine

## 2017-07-08 ENCOUNTER — Inpatient Hospital Stay (HOSPITAL_COMMUNITY)
Admission: EM | Admit: 2017-07-08 | Discharge: 2017-07-09 | DRG: 392 | Disposition: A | Discharge status: Home / Self Care | Payer: BLUE CROSS/BLUE SHIELD | Attending: Internal Medicine | Admitting: Internal Medicine

## 2017-07-08 ENCOUNTER — Other Ambulatory Visit: Payer: Self-pay

## 2017-07-08 ENCOUNTER — Emergency Department (HOSPITAL_COMMUNITY): Payer: BLUE CROSS/BLUE SHIELD

## 2017-07-08 DIAGNOSIS — E860 Dehydration: Secondary | ICD-10-CM | POA: Diagnosis not present

## 2017-07-08 DIAGNOSIS — Z823 Family history of stroke: Secondary | ICD-10-CM | POA: Diagnosis not present

## 2017-07-08 DIAGNOSIS — Z8249 Family history of ischemic heart disease and other diseases of the circulatory system: Secondary | ICD-10-CM

## 2017-07-08 DIAGNOSIS — R7989 Other specified abnormal findings of blood chemistry: Secondary | ICD-10-CM | POA: Diagnosis present

## 2017-07-08 DIAGNOSIS — Z808 Family history of malignant neoplasm of other organs or systems: Secondary | ICD-10-CM

## 2017-07-08 DIAGNOSIS — I4891 Unspecified atrial fibrillation: Secondary | ICD-10-CM | POA: Diagnosis not present

## 2017-07-08 DIAGNOSIS — Z8673 Personal history of transient ischemic attack (TIA), and cerebral infarction without residual deficits: Secondary | ICD-10-CM | POA: Diagnosis not present

## 2017-07-08 DIAGNOSIS — C6991 Malignant neoplasm of unspecified site of right eye: Secondary | ICD-10-CM | POA: Diagnosis present

## 2017-07-08 DIAGNOSIS — Z975 Presence of (intrauterine) contraceptive device: Secondary | ICD-10-CM | POA: Diagnosis not present

## 2017-07-08 DIAGNOSIS — G90A Postural orthostatic tachycardia syndrome (POTS): Secondary | ICD-10-CM | POA: Diagnosis present

## 2017-07-08 DIAGNOSIS — E876 Hypokalemia: Secondary | ICD-10-CM | POA: Diagnosis not present

## 2017-07-08 DIAGNOSIS — K56609 Unspecified intestinal obstruction, unspecified as to partial versus complete obstruction: Secondary | ICD-10-CM | POA: Diagnosis present

## 2017-07-08 DIAGNOSIS — A09 Infectious gastroenteritis and colitis, unspecified: Principal | ICD-10-CM

## 2017-07-08 DIAGNOSIS — Z833 Family history of diabetes mellitus: Secondary | ICD-10-CM | POA: Diagnosis not present

## 2017-07-08 DIAGNOSIS — L409 Psoriasis, unspecified: Secondary | ICD-10-CM | POA: Diagnosis not present

## 2017-07-08 DIAGNOSIS — I498 Other specified cardiac arrhythmias: Secondary | ICD-10-CM | POA: Diagnosis present

## 2017-07-08 DIAGNOSIS — I951 Orthostatic hypotension: Secondary | ICD-10-CM

## 2017-07-08 DIAGNOSIS — K56600 Partial intestinal obstruction, unspecified as to cause: Secondary | ICD-10-CM | POA: Diagnosis not present

## 2017-07-08 DIAGNOSIS — R000 Tachycardia, unspecified: Secondary | ICD-10-CM

## 2017-07-08 DIAGNOSIS — Z87442 Personal history of urinary calculi: Secondary | ICD-10-CM

## 2017-07-08 DIAGNOSIS — F411 Generalized anxiety disorder: Secondary | ICD-10-CM | POA: Diagnosis not present

## 2017-07-08 DIAGNOSIS — R1030 Lower abdominal pain, unspecified: Secondary | ICD-10-CM | POA: Diagnosis not present

## 2017-07-08 DIAGNOSIS — K529 Noninfective gastroenteritis and colitis, unspecified: Secondary | ICD-10-CM | POA: Diagnosis present

## 2017-07-08 LAB — URINALYSIS, ROUTINE W REFLEX MICROSCOPIC
BACTERIA UA: NONE SEEN
BILIRUBIN URINE: NEGATIVE
Glucose, UA: NEGATIVE mg/dL
KETONES UR: 80 mg/dL — AB
Leukocytes, UA: NEGATIVE
NITRITE: NEGATIVE
PROTEIN: NEGATIVE mg/dL
Specific Gravity, Urine: 1.026 (ref 1.005–1.030)
pH: 5 (ref 5.0–8.0)

## 2017-07-08 LAB — CBC WITH DIFFERENTIAL/PLATELET
BASOS ABS: 0 10*3/uL (ref 0.0–0.1)
Basophils Relative: 0 %
EOS PCT: 0 %
Eosinophils Absolute: 0 10*3/uL (ref 0.0–0.7)
HEMATOCRIT: 46.8 % — AB (ref 36.0–46.0)
HEMOGLOBIN: 15.7 g/dL — AB (ref 12.0–15.0)
LYMPHS ABS: 0.9 10*3/uL (ref 0.7–4.0)
LYMPHS PCT: 6 %
MCH: 31.1 pg (ref 26.0–34.0)
MCHC: 33.5 g/dL (ref 30.0–36.0)
MCV: 92.7 fL (ref 78.0–100.0)
Monocytes Absolute: 1 10*3/uL (ref 0.1–1.0)
Monocytes Relative: 7 %
NEUTROS ABS: 13.4 10*3/uL — AB (ref 1.7–7.7)
NEUTROS PCT: 87 %
PLATELETS: 252 10*3/uL (ref 150–400)
RBC: 5.05 MIL/uL (ref 3.87–5.11)
RDW: 13.3 % (ref 11.5–15.5)
WBC: 15.4 10*3/uL — AB (ref 4.0–10.5)

## 2017-07-08 LAB — I-STAT CG4 LACTIC ACID, ED
Lactic Acid, Venous: 2.46 mmol/L (ref 0.5–1.9)
Lactic Acid, Venous: 0.79 mmol/L (ref 0.5–1.9)

## 2017-07-08 LAB — COMPREHENSIVE METABOLIC PANEL
ALK PHOS: 48 U/L (ref 38–126)
ALT: 16 U/L (ref 0–44)
ANION GAP: 12 (ref 5–15)
AST: 23 U/L (ref 15–41)
Albumin: 4.4 g/dL (ref 3.5–5.0)
BUN: 11 mg/dL (ref 6–20)
CHLORIDE: 105 mmol/L (ref 98–111)
CO2: 23 mmol/L (ref 22–32)
CREATININE: 0.85 mg/dL (ref 0.44–1.00)
Calcium: 9.6 mg/dL (ref 8.9–10.3)
GFR calc Af Amer: >60 mL/min (ref >60)
GFR calc non Af Amer: >60 mL/min (ref >60)
GLUCOSE: 104 mg/dL — AB (ref 70–99)
Potassium: 4.2 mmol/L (ref 3.5–5.1)
Sodium: 140 mmol/L (ref 135–145)
Total Bilirubin: 1.5 mg/dL — ABNORMAL HIGH (ref 0.3–1.2)
Total Protein: 8 g/dL (ref 6.5–8.1)

## 2017-07-08 LAB — LIPASE, BLOOD: Lipase: 25 U/L (ref 11–51)

## 2017-07-08 LAB — PREGNANCY, URINE: PREG TEST UR: NEGATIVE

## 2017-07-08 MED ORDER — METRONIDAZOLE IN NACL 5-0.79 MG/ML-% IV SOLN
500.0000 mg | Freq: Three times a day (TID) | INTRAVENOUS | Status: DC
  Administered 2017-07-08 – 2017-07-09 (×4): 500 mg via INTRAVENOUS
  Filled 2017-07-08 (×4): qty 100

## 2017-07-08 MED ORDER — ACETAMINOPHEN 325 MG PO TABS: 650.0000 mg | ORAL_TABLET | Freq: Four times a day (QID) | ORAL | Status: DC | PRN

## 2017-07-08 MED ORDER — ACETAMINOPHEN 650 MG RE SUPP: 650.0000 mg | Freq: Four times a day (QID) | RECTAL | Status: DC | PRN

## 2017-07-08 MED ORDER — SODIUM CHLORIDE 0.9 % IV BOLUS
1000.0000 mL | Freq: Once | INTRAVENOUS | Status: AC
  Administered 2017-07-08: 1000 mL via INTRAVENOUS

## 2017-07-08 MED ORDER — LIDOCAINE HCL URETHRAL/MUCOSAL 2 % EX GEL
CUTANEOUS | Status: AC
  Filled 2017-07-08: qty 10

## 2017-07-08 MED ORDER — ENOXAPARIN SODIUM 40 MG/0.4ML ~~LOC~~ SOLN: 40.0000 mg | SUBCUTANEOUS | Status: DC

## 2017-07-08 MED ORDER — MORPHINE SULFATE (PF) 2 MG/ML IV SOLN
2.0000 mg | INTRAVENOUS | Status: DC | PRN
  Administered 2017-07-08: 2 mg via INTRAVENOUS
  Filled 2017-07-08: qty 1

## 2017-07-08 MED ORDER — CIPROFLOXACIN IN D5W 400 MG/200ML IV SOLN
400.0000 mg | Freq: Two times a day (BID) | INTRAVENOUS | Status: DC
  Administered 2017-07-08 – 2017-07-09 (×3): 400 mg via INTRAVENOUS
  Filled 2017-07-08 (×3): qty 200

## 2017-07-08 MED ORDER — FENTANYL CITRATE (PF) 100 MCG/2ML IJ SOLN
50.0000 ug | Freq: Once | INTRAMUSCULAR | Status: AC
  Administered 2017-07-08: 50 ug via INTRAVENOUS
  Filled 2017-07-08: qty 2

## 2017-07-08 MED ORDER — POTASSIUM CHLORIDE IN NACL 40-0.9 MEQ/L-% IV SOLN
INTRAVENOUS | Status: DC
  Administered 2017-07-08 – 2017-07-09 (×3): 125 mL/h via INTRAVENOUS
  Filled 2017-07-08 (×4): qty 1000

## 2017-07-08 MED ORDER — SODIUM CHLORIDE 0.9 % IV SOLN: INTRAVENOUS | Status: DC

## 2017-07-08 MED ORDER — ONDANSETRON HCL 4 MG/2ML IJ SOLN
4.0000 mg | INTRAMUSCULAR | Status: DC | PRN
  Administered 2017-07-08 (×2): 4 mg via INTRAVENOUS
  Filled 2017-07-08 (×2): qty 2

## 2017-07-08 MED ORDER — ONDANSETRON HCL 4 MG/2ML IJ SOLN
4.0000 mg | Freq: Once | INTRAMUSCULAR | Status: AC
  Administered 2017-07-08: 4 mg via INTRAVENOUS
  Filled 2017-07-08: qty 2

## 2017-07-08 MED ORDER — ONDANSETRON HCL 4 MG PO TABS: 4.0000 mg | ORAL_TABLET | Freq: Four times a day (QID) | ORAL | Status: DC | PRN

## 2017-07-08 NOTE — ED Notes (Signed)
CRITICAL VALUE ALERT  Critical Value: Lactic 2.46  Date & Time Notied:  07/08/17 0658  Provider Notified: [facility] EDP  Orders Received/Actions taken: No orders at this time

## 2017-07-08 NOTE — ED Notes (Signed)
Per MD, no NG tube.

## 2017-07-08 NOTE — ED Provider Notes (Signed)
[facility] Provider Note   CSN: [id number] Arrival date & time: 07/08/17  0540     History   Chief Complaint Chief Complaint  Patient presents with  . Abdominal Pain    HPI Candice Estrada is a 42 y.o. female.  Patient returns with worsening abdominal pain for the past 2 days.  States she is had about 10 episodes of dark emesis both on June 23 June 24th.  She started with epigastric pain that is since moved to her lower abdomen.  She was seen for the same pain on June 23 and had a negative CT scan that showed fluid-filled small bowel loops.  She was seen at [facility] ED yesterday and had an x-ray that showed some dilated loops and possible ileus versus early bowel obstruction.  Her eye surgery was canceled due to her abdominal pain.  States she has not been able to keep anything down for the past 2 days.  Denies any diarrhea.  Last bowel movement was on June 22.  Temperature at 99 at home.  No pain with urination or blood in the urine.  No vaginal bleeding or vaginal discharge.  No previous abdominal surgeries.  No previous abdominal history.  The history is provided by the patient.  Abdominal Pain   Associated symptoms include nausea and vomiting. Pertinent negatives include fever, diarrhea, constipation, dysuria, hematuria, headaches, arthralgias and myalgias.    Past Medical History:  Diagnosis Date  . Anxiety   . ASD (atrial septal defect), ostium secundum    Status post closure 08/20/10 with Dr. Burt Knack; procedure complicated by right groin hematoma and acute blood loss anemia  . BV (bacterial vaginosis) 10/06/2014  . Complication of anesthesia    VASO-VAGAL RESPONSE IN RECOVERY ROOM AFTER HEART SURGERY  . Dysrhythmia    A-FIB  . Hematuria   . History of psoriasis    scalp  . History of TIAs   . Hx of dizziness    random episodes "because my BP is so low"  . Hypotension   . IUD (intrauterine device) in place 12/16/2012  . Kidney stone   . Meniere's  disease   . Night terrors   . Ovarian cyst   . Stroke (HCC)    weakness l foot  . 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 Thyroid enlarged 12/16/2012   History of cyst will get [place]  . Vaginal discharge 10/06/2014    Patient Active Problem List   Diagnosis Date Noted  . History of thyroid nodule 10/21/2016  . Vaginal discharge 10/06/2014  . BV (bacterial vaginosis) 10/06/2014  . Ovarian cyst 01/26/2014  . Pelvic pain in female 01/19/2014  . Anxiety 01/19/2014  . Fecal occult blood test positive 01/19/2014  . Night terrors, adult 02/23/2013  . IUD (intrauterine device) in place 12/16/2012  . Thyroid enlarged 12/16/2012  . Well woman exam with routine gynecological exam 09/09/2012  . Irregular bleeding 08/03/2012  . Syncope and collapse 04/26/2011  . Fatigue 02/06/2011  . POTS (postural orthostatic tachycardia syndrome) 09/15/2010  . TIA (transient ischemic attack) 09/15/2010  . Dizziness 09/10/2010  . Hypotension 09/10/2010  . Palpitations 09/06/2010  . Atrial septal defect, secundum 08/10/2010  . OVARIAN CYST, RIGHT 12/28/2007  . Anxiety state 12/03/2005    Past Surgical History:  Procedure Laterality Date  . ASD REPAIR  08/20/2010  . CARDIAC SURGERY    . CYSTOSCOPY WITH RETROGRADE PYELOGRAM, URETEROSCOPY AND STENT PLACEMENT Left 08/31/2014   Procedure: CYSTOSCOPY WITH  BILATERAL RETROGRADE PYELOGRAM, URETEROSCOPY AND STENT PLACEMENT, STONE EXTRACTION WITH BASKET;  Surgeon: Cleon Gustin, MD;  Location: [facility];  Service: Urology;  Laterality: Left;  . FOOT NEUROMA SURGERY  2009   left  . kidney stone removed    . THYROID CYST EXCISION    . TONSILLECTOMY       OB History    Gravida  3   Para  2   Term      Preterm      AB  1   Living  2     SAB  1   TAB      Ectopic      Multiple      Live Births  2            Home Medications    Prior to Admission medications   Medication Sig Start Date End Date Taking? Authorizing  Provider  buPROPion [facility] SR) 150 MG 12 hr tablet TAKE 1 TABLET DAILY 02/24/17   Derrek Monaco A, NP  ibuprofen (ADVIL,MOTRIN) 800 MG tablet Take 1 tablet (800 mg total) by mouth every 8 (eight) hours as needed. 10/26/15   Estill Dooms, NP  INTERFERON ALFA-2B IJ Apply to eye. 03/24/17   [provider]  meclizine (ANTIVERT) 12.5 MG tablet Take 1 tablet (12.5 mg total) by mouth daily as needed for dizziness. 07/13/14   Deboraha Sprang, MD  metoCLOPramide (REGLAN) 10 MG tablet Take 1 tablet (10 mg total) by mouth every 6 (six) hours as needed for up to 5 days for nausea. 07/06/17 07/11/17  Nuala Alpha A, PA-C  Multiple Vitamin (MULTIVITAMIN WITH MINERALS) TABS Take 1 tablet by mouth every morning.     [provider]  PARAGARD INTRAUTERINE COPPER IU by Intrauterine route.    [provider]    Family History Family History  Problem Relation Age of Onset  . Hypertension Father 30  . Stroke Father   . Heart attack Father   . Brain cancer Father   . Heart disease Father   . Diabetes Father   . Liver disease Mother 69       dead  . Cancer Mother        breast  . Hypertension Brother 59  . Cancer Paternal Grandmother        breast  . Dementia Maternal Grandmother   . Cancer Maternal Grandmother        thyroid  . Cancer Paternal Aunt        breast   . Kidney failure Daughter   . Cancer Other        mom's grandma-breast    Social History Social History   Tobacco Use  . Smoking status: Never Smoker  . Smokeless tobacco: Never Used  Substance Use Topics  . Alcohol use: Yes    Alcohol/week: 0.0 oz    Comment: very rarely  . Drug use: No     Allergies   Patient has no known allergies.   Review of Systems Review of Systems  Constitutional: Positive for activity change and appetite change. Negative for fatigue and fever.  HENT: Negative for congestion.   Eyes: Negative for visual disturbance.  Respiratory: Negative for cough,  chest tightness and shortness of breath.   Cardiovascular: Negative for chest pain.  Gastrointestinal: Positive for abdominal pain, nausea and vomiting. Negative for constipation and diarrhea.  Genitourinary: Negative for dysuria, hematuria, vaginal bleeding and vaginal discharge.  Musculoskeletal: Negative for arthralgias and myalgias.  Neurological: Negative for dizziness, weakness and headaches.   all other systems are negative except as noted in the HPI and PMH.     Physical Exam Updated Vital Signs BP 111/79 (BP Location: Right Arm)   Pulse 99   Temp 97.8 F (36.6 C) (Oral)   Resp 16   Wt 61.7 kg (136 lb)   SpO2 99%   BMI 24.48 kg/m   Physical Exam  Constitutional: She is oriented to person, place, and time. She appears well-developed and well-nourished. No distress.  Uncomfortable appearing  HENT:  Head: Normocephalic and atraumatic.  Mouth/Throat: Oropharynx is clear and moist. No oropharyngeal exudate.  Eyes: Pupils are equal, round, and reactive to light. Conjunctivae and EOM are normal.  Neck: Normal range of motion. Neck supple.  No meningismus.  Cardiovascular: Normal rate, regular rhythm, normal heart sounds and intact distal pulses.  No murmur heard. Pulmonary/Chest: Effort normal and breath sounds normal. No respiratory distress.  Abdominal: Soft. There is tenderness. There is guarding. There is no rebound.  Diffuse tenderness, worse in epigastrium and periumbilical.  Voluntary guarding throughout.  No rebound  Musculoskeletal: Normal range of motion. She exhibits no edema or tenderness.  No CVA tenderness  Neurological: She is alert and oriented to person, place, and time. No cranial nerve deficit. She exhibits normal muscle tone. Coordination normal.   5/5 strength throughout. CN 2-12 intact.Equal grip strength.   Skin: Skin is warm. Capillary refill takes less than 2 seconds. No rash noted.  Psychiatric: She has a normal mood and affect. Her behavior is  normal.  Nursing note and vitals reviewed.    ED Treatments / Results  Labs (all labs ordered are listed, but only abnormal results are displayed) Labs Reviewed  CBC WITH DIFFERENTIAL/PLATELET - Abnormal; Notable for the following components:      Result Value   WBC 15.4 (*)    Hemoglobin 15.7 (*)    HCT 46.8 (*)    Neutro Abs 13.4 (*)    All other components within normal limits  COMPREHENSIVE METABOLIC PANEL - Abnormal; Notable for the following components:   Glucose, Bld 104 (*)    Total Bilirubin 1.5 (*)    All other components within normal limits  URINALYSIS, ROUTINE W REFLEX MICROSCOPIC - Abnormal; Notable for the following components:   APPearance HAZY (*)    Hgb urine dipstick MODERATE (*)    Ketones, ur 80 (*)    All other components within normal limits  I-STAT CG4 LACTIC ACID, ED - Abnormal; Notable for the following components:   Lactic Acid, Venous 2.46 (*)    All other components within normal limits  LIPASE, BLOOD  PREGNANCY, URINE  I-STAT CG4 LACTIC ACID, ED    EKG None  Radiology Ct Abdomen Pelvis W Contrast  Result Date: 07/06/2017 CLINICAL DATA:  42 y/o F; abdominal pain, gastroenteritis or colitis suspected EXAM: CT ABDOMEN AND PELVIS WITH CONTRAST TECHNIQUE: Multidetector CT imaging of the abdomen and pelvis was performed using the standard protocol following bolus administration of intravenous contrast. CONTRAST:  100 cc Isovue 300 COMPARISON:  08/08/2014 abdomen MRI. 07/25/2014 CT abdomen and pelvis. FINDINGS: Lower chest: No acute abnormality. Hepatobiliary: No focal liver abnormality is seen. No gallstones, gallbladder wall thickening, or biliary dilatation. Pancreas: Unremarkable. No pancreatic ductal dilatation or surrounding inflammatory changes. Spleen: Normal in size without focal abnormality. Adrenals/Urinary Tract: Adrenal glands are unremarkable. Stable right interpolar kidney 1.2 cm cyst better characterized on prior abdominal MRI. Multiple  2-3 mm nonobstructing stones  in the left kidney. No hydronephrosis. Normal bladder. Stomach/Bowel: Stomach is within normal limits. Appendix appears normal. Nonspecific mildly prominent and fluid-filled loops of small bowel in the left hemiabdomen. No findings of obstruction. Normal appearance of the colon and stomach. Vascular/Lymphatic: No significant vascular findings are present. No enlarged abdominal or pelvic lymph nodes. Reproductive: IUD well seated within the uterine fundus. Stable 11 mm cervical nabothian cyst. Other: No abdominal wall hernia or abnormality. No abdominopelvic ascites. Musculoskeletal: No acute or significant osseous findings. IMPRESSION: 1. Nonspecific mildly prominent fluid-filled loops of small bowel in the left hemiabdomen, possibly enteritis. 2. Multiple stable nonobstructing punctate stones in the left kidney. 3. IUD well seated within the uterine fundus. Electronically Signed   By: Kristine Garbe M.D.   On: 07/06/2017 21:59   Dg Chest Port 1 View  Result Date: 07/06/2017 CLINICAL DATA:  Shortness of breath. EXAM: PORTABLE CHEST 1 VIEW COMPARISON:  Radiographs of May 28, 2015. FINDINGS: The heart size and mediastinal contours are within normal limits. Both lungs are clear. No pneumothorax or pleural effusion is noted. The visualized skeletal structures are unremarkable. IMPRESSION: No acute cardiopulmonary abnormality seen. Electronically Signed   By: Marijo Conception, M.D.   On: 07/06/2017 20:49   Dg Abdomen Acute W/chest  Result Date: 07/08/2017 CLINICAL DATA:  Lower abdominal pain and nausea for 3 days. EXAM: DG ABDOMEN ACUTE W/ 1V CHEST COMPARISON:  None. FINDINGS: The cardiac silhouette, mediastinal and hilar contours are normal. The lungs are clear. No pleural effusion. There are dilated small bowel loops with air-fluid levels on the upright film consistent with small-bowel obstruction. There is some stool in the right colon but otherwise the colon appears  decompressed. No free air. The soft tissue shadows are maintained. An IUD is noted in the central pelvis. Bony structures are unremarkable. IMPRESSION: 1. No acute cardiopulmonary findings. 2. Small-bowel obstruction bowel gas pattern.  No free air. Electronically Signed   By: Marijo Sanes M.D.   On: 07/08/2017 08:10    Procedures Procedures (including critical care time)  Medications Ordered in ED Medications  ondansetron (ZOFRAN) injection 4 mg (4 mg Intravenous Given 07/08/17 0609)  sodium chloride 0.9 % bolus 1,000 mL (1,000 mLs Intravenous New Bag/Given 07/08/17 0609)     Initial Impression / Assessment and Plan / ED Course  I have reviewed the triage vital signs and the nursing notes.  Pertinent labs & imaging results that were available during my care of the patient were reviewed by me and considered in my medical decision making (see chart for details).    Patient returns with worsening abdominal pain and vomiting.  She is diffusely tender.  CT scan reviewed.  Patient did have x-ray at [facility] yesterday that showed dilated small bowel loops and concern for partial obstruction.  Labs today show leukocytosis.  We will recheck acute abdominal series  IVF, pain control, nausea control. AAS with evidence of SBO. No Free air.   Place NG tube, d/w Dr. Arnoldo Morale. He will see patient and recommends medical admission. D/w Dr. Clementeen Graham. Continue IVF, NGT, symptom control, NPO.  Final Clinical Impressions(s) / ED Diagnoses   Final diagnoses:  Small bowel obstruction [facility])  Dehydration    ED Discharge Orders    None       Ezequiel Essex, MD 07/08/17 [phone]

## 2017-07-08 NOTE — H&P (Signed)
TRH H&P   Patient Demographics:    Candice Estrada, is a 42 y.o. female  MRN: [id number]   DOB - 1975/08/22  Admit Date - 07/08/2017  Outpatient Primary MD for the patient is Buelah Manis, Modena Nunnery, MD  Referring MD: Dr. Wyvonnia Dusky  Outpatient Specialists: Ophthalmologist at [place]  Patient coming from: Home  Chief Complaint  Patient presents with  . Abdominal Pain      HPI:    Candice Estrada  is a 42 y.o. female, with history of anxiety, history of TIA, hypertension, history of ASD status post closure in 2012, recently diagnosed ocular surface squamous neoplasia, Right eye was scheduled to be removed this week presented to the ED with ongoing abdominal pain associated with several episodes of nausea and vomiting since yesterday morning.  She presented to the ED 2 days back feeling dizzy followed by supraumbilical and epigastric abdominal cramping.  Labs were unremarkable.  CT of the abdomen done cephalic prominent fluid-filled loops of small bowel possible for enteritis.  She was sent home getting some fluids and medications in the ED.  She then went to [facility] yesterday where she had scheduled eye surgery but was complaining of ongoing periumbilical pain associated with several episodes of nausea and vomiting.  In the [facility] ED she had an x-ray done finding concerning for ileus.  She also had elevated WBC of 15.5K.  She was discharged home.  Patient however could not keep anything down and had persistent crampy abdominal pain and came to the ED.  She reports eating at a restaurant 3 days back with her husband but no one else in the family was sick.  She reports having a temperature of 99.8 F at home, last bowel movement was 3 days ago.  She denies any headache, further dizziness, chills, chest pain, shortness of breath, hematemesis or melena.  Denies any dysuria.  Denies any recent travel or sick  contact.  Denies any prior bowel surgeries or similar history. In the ED vitals were stable.  Blood work showed WBC of 15.4K, normal electrolytes including lipase and LFTs.  Lactic acid was elevated at 2.46.  X-ray of the abdomen showed findings concerning for small bowel obstruction bowel gas pattern with no free air. Patient given 2 L normal saline bolus, antiemetic and pain medication.  Hospitalist consulted for admission to medical floor.  General surgery consulted as well.        Review of systems:    In addition to the HPI above,  Subjective fever, no chills No Headache, No changes with Vision or hearing, No problems swallowing food or Liquids, No Chest pain, Cough or Shortness of Breath,  no diarrhea, constipation + No Blood in stool or Urine,Abdominal pain+++nausea and vomiting+++++, No dysuria, No new skin rashes or bruises, No new joints pains-aches,  No new weakness, tingling, numbness in any extremity, No recent weight gain  or loss, No polyuria, polydypsia or polyphagia, No significant Mental Stressors.     With Past History of the following :    Past Medical History:  Diagnosis Date  . Anxiety   . ASD (atrial septal defect), ostium secundum    Status post closure 08/20/10 with Dr. Burt Knack; procedure complicated by right groin hematoma and acute blood loss anemia  . BV (bacterial vaginosis) 10/06/2014  . Complication of anesthesia    VASO-VAGAL RESPONSE IN RECOVERY ROOM AFTER HEART SURGERY  . Dysrhythmia    A-FIB  . Hematuria   . History of psoriasis    scalp  . History of TIAs   . Hx of dizziness    random episodes "because my BP is so low"  . Hypotension   . IUD (intrauterine device) in place 12/16/2012  . Kidney stone   . Meniere's disease   . Night terrors   . Ovarian cyst   . Stroke (HCC)    weakness l foot  . 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 Thyroid enlarged 12/16/2012   History of cyst will get [place]  . Vaginal discharge  10/06/2014      Past Surgical History:  Procedure Laterality Date  . ASD REPAIR  08/20/2010  . CARDIAC SURGERY    . CYSTOSCOPY WITH RETROGRADE PYELOGRAM, URETEROSCOPY AND STENT PLACEMENT Left 08/31/2014   Procedure: CYSTOSCOPY WITH BILATERAL RETROGRADE PYELOGRAM, URETEROSCOPY AND STENT PLACEMENT, STONE EXTRACTION WITH BASKET;  Surgeon: Cleon Gustin, MD;  Location: [facility];  Service: Urology;  Laterality: Left;  . FOOT NEUROMA SURGERY  2009   left  . kidney stone removed    . THYROID CYST EXCISION    . TONSILLECTOMY        Social History:     Social History   Tobacco Use  . Smoking status: Never Smoker  . Smokeless tobacco: Never Used  Substance Use Topics  . Alcohol use: Yes    Alcohol/week: 0.0 oz    Comment: very rarely     Lives -with husband  Mobility -independent     Family History :     Family History  Problem Relation Age of Onset  . Hypertension Father 39  . Stroke Father   . Heart attack Father   . Brain cancer Father   . Heart disease Father   . Diabetes Father   . Liver disease Mother 64       dead  . Cancer Mother        breast  . Hypertension Brother 30  . Cancer Paternal Grandmother        breast  . Dementia Maternal Grandmother   . Cancer Maternal Grandmother        thyroid  . Cancer Paternal Aunt        breast   . Kidney failure Daughter   . Cancer Other        mom's grandma-breast      Home Medications:   Prior to Admission medications   Medication Sig Start Date End Date Taking? Authorizing Provider  buPROPion [facility] SR) 150 MG 12 hr tablet TAKE 1 TABLET DAILY 02/24/17   Derrek Monaco A, NP  ibuprofen (ADVIL,MOTRIN) 800 MG tablet Take 1 tablet (800 mg total) by mouth every 8 (eight) hours as needed. 10/26/15   Estill Dooms, NP  INTERFERON ALFA-2B IJ Apply to eye. 03/24/17   [provider]  meclizine (ANTIVERT) 12.5 MG tablet Take 1 tablet (12.5 mg total) by  mouth daily as needed for dizziness. 07/13/14    Deboraha Sprang, MD  metoCLOPramide (REGLAN) 10 MG tablet Take 1 tablet (10 mg total) by mouth every 6 (six) hours as needed for up to 5 days for nausea. 07/06/17 07/11/17  Nuala Alpha A, PA-C  Multiple Vitamin (MULTIVITAMIN WITH MINERALS) TABS Take 1 tablet by mouth every morning.     [provider]  PARAGARD INTRAUTERINE COPPER IU by Intrauterine route.    [provider]     Allergies:    No Known Allergies   Physical Exam:   Vitals  Blood pressure 120/77, pulse 76, temperature 97.8 F (36.6 C), temperature source Oral, resp. rate 12, weight 61.7 kg (136 lb), last menstrual period 06/24/2017, SpO2 100 %.   1. General middle-aged female lying in bed appears fatigued, not in distress HEENT: Pupils reactive bilaterally, EOMI, no pallor, no icterus, dry oral mucosa, supple neck, no cervical lymphadenopathy Chest: Clear to auscultation bilaterally, no added sound CVS: Normal S1 and S2, no murmurs rub or gallop GI: Soft, nondistended, bowel sounds present, diffuse tenderness to palpation more pronounced over the periumbilical area Musculoskeletal: Warm, no edema, normal skin CNS: Alert and oriented, nonfocal     Data Review:    CBC Recent Labs  Lab 07/06/17 1955 07/08/17 0610  WBC 8.3 15.4*  HGB 14.5 15.7*  HCT 44.0 46.8*  PLT 233 252  MCV 91.9 92.7  MCH 30.3 31.1  MCHC 33.0 33.5  RDW 13.2 13.3  LYMPHSABS 2.1 0.9  MONOABS 0.7 1.0  EOSABS 0.1 0.0  BASOSABS 0.0 0.0   ------------------------------------------------------------------------------------------------------------------  Chemistries  Recent Labs  Lab 07/06/17 1955 07/08/17 0610  NA 137 140  K 3.2* 4.2  CL 106 105  CO2 23 23  GLUCOSE 101* 104*  BUN 10 11  CREATININE 0.87 0.85  CALCIUM 9.2 9.6  AST 19 23  ALT 16 16  ALKPHOS 43 48  BILITOT 0.7 1.5*   ------------------------------------------------------------------------------------------------------------------ estimated  creatinine clearance is 74.8 mL/min (by C-G formula based on SCr of 0.85 mg/dL). ------------------------------------------------------------------------------------------------------------------ No results for input(s): TSH, T4TOTAL, T3FREE, THYROIDAB in the last 72 hours.  Invalid input(s): FREET3  Coagulation profile No results for input(s): INR, PROTIME in the last 168 hours. ------------------------------------------------------------------------------------------------------------------- No results for input(s): DDIMER in the last 72 hours. -------------------------------------------------------------------------------------------------------------------  Cardiac Enzymes Recent Labs  Lab 07/06/17 1955  TROPONINI <0.03   ------------------------------------------------------------------------------------------------------------------ No results found for: BNP   ---------------------------------------------------------------------------------------------------------------  Urinalysis    Component Value Date/Time   COLORURINE YELLOW 07/08/2017 0553   APPEARANCEUR HAZY (A) 07/08/2017 0553   LABSPEC 1.026 07/08/2017 0553   PHURINE 5.0 07/08/2017 0553   GLUCOSEU NEGATIVE 07/08/2017 0553   HGBUR MODERATE (A) 07/08/2017 0553   HGBUR large 08/19/2008 1522   BILIRUBINUR NEGATIVE 07/08/2017 0553   KETONESUR 80 (A) 07/08/2017 0553   PROTEINUR NEGATIVE 07/08/2017 0553   UROBILINOGEN 0.2 07/19/2014 1019   NITRITE NEGATIVE 07/08/2017 0553   LEUKOCYTESUR NEGATIVE 07/08/2017 0553    ----------------------------------------------------------------------------------------------------------------   Imaging Results:    Ct Abdomen Pelvis W Contrast  Result Date: 07/06/2017 CLINICAL DATA:  42 y/o F; abdominal pain, gastroenteritis or colitis suspected EXAM: CT ABDOMEN AND PELVIS WITH CONTRAST TECHNIQUE: Multidetector CT imaging of the abdomen and pelvis was performed using the standard  protocol following bolus administration of intravenous contrast. CONTRAST:  100 cc Isovue 300 COMPARISON:  08/08/2014 abdomen MRI. 07/25/2014 CT abdomen and pelvis. FINDINGS: Lower chest: No acute abnormality. Hepatobiliary: No focal liver abnormality is seen. No  gallstones, gallbladder wall thickening, or biliary dilatation. Pancreas: Unremarkable. No pancreatic ductal dilatation or surrounding inflammatory changes. Spleen: Normal in size without focal abnormality. Adrenals/Urinary Tract: Adrenal glands are unremarkable. Stable right interpolar kidney 1.2 cm cyst better characterized on prior abdominal MRI. Multiple 2-3 mm nonobstructing stones in the left kidney. No hydronephrosis. Normal bladder. Stomach/Bowel: Stomach is within normal limits. Appendix appears normal. Nonspecific mildly prominent and fluid-filled loops of small bowel in the left hemiabdomen. No findings of obstruction. Normal appearance of the colon and stomach. Vascular/Lymphatic: No significant vascular findings are present. No enlarged abdominal or pelvic lymph nodes. Reproductive: IUD well seated within the uterine fundus. Stable 11 mm cervical nabothian cyst. Other: No abdominal wall hernia or abnormality. No abdominopelvic ascites. Musculoskeletal: No acute or significant osseous findings. IMPRESSION: 1. Nonspecific mildly prominent fluid-filled loops of small bowel in the left hemiabdomen, possibly enteritis. 2. Multiple stable nonobstructing punctate stones in the left kidney. 3. IUD well seated within the uterine fundus. Electronically Signed   By: Kristine Garbe M.D.   On: 07/06/2017 21:59   Dg Chest Port 1 View  Result Date: 07/06/2017 CLINICAL DATA:  Shortness of breath. EXAM: PORTABLE CHEST 1 VIEW COMPARISON:  Radiographs of May 28, 2015. FINDINGS: The heart size and mediastinal contours are within normal limits. Both lungs are clear. No pneumothorax or pleural effusion is noted. The visualized skeletal structures are  unremarkable. IMPRESSION: No acute cardiopulmonary abnormality seen. Electronically Signed   By: Marijo Conception, M.D.   On: 07/06/2017 20:49   Dg Abdomen Acute W/chest  Result Date: 07/08/2017 CLINICAL DATA:  Lower abdominal pain and nausea for 3 days. EXAM: DG ABDOMEN ACUTE W/ 1V CHEST COMPARISON:  None. FINDINGS: The cardiac silhouette, mediastinal and hilar contours are normal. The lungs are clear. No pleural effusion. There are dilated small bowel loops with air-fluid levels on the upright film consistent with small-bowel obstruction. There is some stool in the right colon but otherwise the colon appears decompressed. No free air. The soft tissue shadows are maintained. An IUD is noted in the central pelvis. Bony structures are unremarkable. IMPRESSION: 1. No acute cardiopulmonary findings. 2. Small-bowel obstruction bowel gas pattern.  No free air. Electronically Signed   By: Marijo Sanes M.D.   On: 07/08/2017 08:10    My personal review of EKG: None   Assessment & Plan:    Principal Problem:   Enteritis of infectious origin Symptoms are likely due to enteritis rather than small bowel obstruction.  Patient has good bowel sounds and nondistended abdomen. Admit to MedSurg.  Keep n.p.o.  IV hydration with normal saline.  Keep K >4.  Empiric IV ciprofloxacin and Flagyl.  Pain control with PRN IV morphine.  Does not need NG tube. Serial abdominal exam.  Discussed with surgery and recommended they will follow peripherally.  Agree that this is likely enteritis and does not seem to have bowel obstruction.  Active Problems:   Anxiety state Hold home medications.  Elevated lactic acid Secondary to acute infection.  Monitor with fluids.  DVT Prophylaxis subcu Lovenox  AM Labs Ordered, also please review Full Orders  Family Communication: Admission, patients condition and plan of care including tests being ordered have been discussed with the patient and her husband at bedside  Code Status  full code  Likely DC to  Home once improved possibly in the next 48 hours  Condition: Fair  Consults called: Discussed with general surgery  Admission status: Inpatient  Time spent in minutes :  Coleman M.D on 07/08/2017 at 9:00 AM  Between 7am to 7pm - Pager - [phone]. After 7pm go to [email or web address] - password [facility]  [facility] - Office  [phone]

## 2017-07-08 NOTE — ED Notes (Signed)
Attempting to insert NG tube. MD at bedside.

## 2017-07-08 NOTE — ED Triage Notes (Signed)
Pt c/o abd pain with vomiting, she has been seen here and [facility] for the same.

## 2017-07-08 NOTE — ED Notes (Signed)
Pt to xray. Will give pain meds on return

## 2017-07-09 DIAGNOSIS — F411 Generalized anxiety disorder: Secondary | ICD-10-CM

## 2017-07-09 LAB — BASIC METABOLIC PANEL
Anion gap: 5 (ref 5–15)
BUN: 7 mg/dL (ref 6–20)
CALCIUM: 8 mg/dL — AB (ref 8.9–10.3)
CO2: 20 mmol/L — AB (ref 22–32)
CREATININE: 0.71 mg/dL (ref 0.44–1.00)
Chloride: 112 mmol/L — ABNORMAL HIGH (ref 98–111)
GFR calc non Af Amer: >60 mL/min (ref >60)
Glucose, Bld: 119 mg/dL — ABNORMAL HIGH (ref 70–99)
Potassium: 3.9 mmol/L (ref 3.5–5.1)
SODIUM: 137 mmol/L (ref 135–145)

## 2017-07-09 LAB — CBC
HCT: 39.5 % (ref 36.0–46.0)
Hemoglobin: 12.7 g/dL (ref 12.0–15.0)
MCH: 30.5 pg (ref 26.0–34.0)
MCHC: 32.2 g/dL (ref 30.0–36.0)
MCV: 95 fL (ref 78.0–100.0)
PLATELETS: 193 10*3/uL (ref 150–400)
RBC: 4.16 MIL/uL (ref 3.87–5.11)
RDW: 13.6 % (ref 11.5–15.5)
WBC: 8.5 10*3/uL (ref 4.0–10.5)

## 2017-07-09 LAB — MAGNESIUM: MAGNESIUM: 1.8 mg/dL (ref 1.7–2.4)

## 2017-07-09 MED ORDER — SACCHAROMYCES BOULARDII 250 MG PO CAPS
250.0000 mg | ORAL_CAPSULE | Freq: Two times a day (BID) | ORAL | Status: DC
  Administered 2017-07-09: 250 mg via ORAL
  Filled 2017-07-09: qty 1

## 2017-07-09 MED ORDER — CIPROFLOXACIN HCL 500 MG PO TABS: 500.0000 mg | ORAL_TABLET | Freq: Two times a day (BID) | ORAL | 0 refills | Status: AC

## 2017-07-09 MED ORDER — SACCHAROMYCES BOULARDII 250 MG PO CAPS: 250.0000 mg | ORAL_CAPSULE | Freq: Two times a day (BID) | ORAL | 0 refills | Status: DC

## 2017-07-09 MED ORDER — METRONIDAZOLE 500 MG PO TABS: 500.0000 mg | ORAL_TABLET | Freq: Three times a day (TID) | ORAL | 0 refills | Status: AC

## 2017-07-09 NOTE — Consult Note (Signed)
Discussed with Dr. Clementeen Graham yesterday.  As patient's primary diagnosis is enteritis, no need for acute surgical intervention at this time.  Please call me if surgical assistance needed.  We will follow peripherally with you.

## 2017-07-09 NOTE — Progress Notes (Signed)
Patient's 2 IVs removed and sites intact.

## 2017-07-09 NOTE — Progress Notes (Signed)
Patient discharged home with personal belongings and prescriptions.

## 2017-07-09 NOTE — Progress Notes (Signed)
Patient refusing AM lab draw. Manuella Ghazi, MD notified.

## 2017-07-09 NOTE — Discharge Summary (Signed)
Physician Discharge Summary  Candice Estrada ZOX:[id number] DOB: 1975/10/20 DOA: 07/08/2017  PCP: Alycia Rossetti, MD  Admit date: 07/08/2017 Discharge date: 07/09/2017  Time spent: 35 minutes  Recommendations for Outpatient Follow-up:  1. Repeat CBC to follow WBC's trend  2. Repeat BMET to follow electrolytes and renal function.   Discharge Diagnoses:  Principal Problem:   Enteritis of infectious origin Active Problems:   Anxiety state   POTS (postural orthostatic tachycardia syndrome)   Small bowel obstruction (HCC)   Partial small bowel obstruction (HCC)   Hypokalemia   Enteritis   Discharge Condition: stable and improved. Discharge home with instructions to follow up with PCP in 2 weeks.  Diet recommendation: Soft consistency/low residue diet.  Filed Weights   07/08/17 0545 07/08/17 1652  Weight: 61.7 kg (136 lb) 62.5 kg (137 lb 11.2 oz)    History of present illness:  As per H&P written by Dr. Clementeen Graham on 07/08/17. 42 y.o. female, with history of anxiety, history of TIA, hypertension, history of ASD status post closure in 2012, recently diagnosed ocular surface squamous neoplasia, Right eye was scheduled to be removed this week presented to the ED with ongoing abdominal pain associated with several episodes of nausea and vomiting since yesterday morning.  She presented to the ED 2 days back feeling dizzy followed by supraumbilical and epigastric abdominal cramping.  Labs were unremarkable.  CT of the abdomen done cephalic prominent fluid-filled loops of small bowel possible for enteritis.  She was sent home getting some fluids and medications in the ED.  She then went to [facility] yesterday where she had scheduled eye surgery but was complaining of ongoing periumbilical pain associated with several episodes of nausea and vomiting.  In the [facility] ED she had an x-ray done finding concerning for ileus.  She also had elevated WBC of 15.5K.  She was discharged home.  Patient however could  not keep anything down and had persistent crampy abdominal pain and came to the ED.  She reports eating at a restaurant 3 days back with her husband but no one else in the family was sick.  She reports having a temperature of 99.8 F at home, last bowel movement was 3 days ago.  She denies any headache, further dizziness, chills, chest pain, shortness of breath, hematemesis or melena.  Denies any dysuria.  Denies any recent travel or sick contact.  Denies any prior bowel surgeries or similar history. In the ED vitals were stable.  Blood work showed WBC of 15.4K, normal electrolytes including lipase and LFTs.  Lactic acid was elevated at 2.46.  X-ray of the abdomen showed findings concerning for small bowel obstruction bowel gas pattern with no free air. Patient given 2 L normal saline bolus, antiemetic and pain medication.  Hospitalist consulted for admission to medical floor.  General surgery consulted as well.  Hospital Course:  1-acute enteritis of infectious origin: -No fever, no further nausea, no vomiting and tolerating diet. -Patient will be discharged with a 10 days course of Flagyl and Cipro -Advised to keep herself well-hydrated and to follow-up with PCP in 2 weeks. -Florastor has been also added to improve population of normal intestinal flora. -continue PRN antiemetics   2-anxiety -no SI or hallucinations  -resume home psychiatry medications (bupropion)   3-dehydration and elevated lactic acid  -resolved with IVF's  4-hypokalemia -repleted and WNL at discharge  5-hx of ocular neoplasia -continue eye drops and follow up with ophthalmology at [facility] hospital    Procedures:  See below for x-ray reports.  Consultations:  General surgery was consulted by the emergency department  Discharge Exam: Vitals:   07/09/17 0648 07/09/17 1450  BP: 95/72 101/64  Pulse: 79 82  Resp: 20 16  Temp: 98.2 F (36.8 C) 98.2 F (36.8 C)  SpO2: 97% 97%    General: Afebrile, no chest  pain, no SOB, no nausea, tolerating diet and reporting no abdominal pain.  Patient having BM's. Cardiovascular: S1 and S2, no rubs, no gallops, no murmurs, no JVD. Respiratory: Good air movement bilaterally, no wheezing, no crackles. Abdomen: Soft, nontender, positive bowel sounds, no distention or guarding appreciated on exam. Extremities: No edema, no cyanosis, no clubbing.  Discharge Instructions   Discharge Instructions    Discharge instructions   Complete by:  As directed    Keep yourself well-hydrated (half your weight in ounces of water, minimum). Take medications as prescribed Arrange follow-up with PCP in 2 weeks.     Allergies as of 07/09/2017   No Known Allergies     Medication List    STOP taking these medications   metoCLOPramide 10 MG tablet Commonly known as:  REGLAN     TAKE these medications   augmented betamethasone dipropionate 0.05 % ointment Commonly known as:  DIPROLENE-AF Apply 1 application topically 2 (two) times daily as needed.   buPROPion 150 MG 12 hr tablet Commonly known as:  WELLBUTRIN SR TAKE 1 TABLET DAILY   ciprofloxacin 500 MG tablet Commonly known as:  CIPRO Take 1 tablet (500 mg total) by mouth 2 (two) times daily for 10 days.   fluorouracil 5 % cream Commonly known as:  EFUDEX Apply 1 application topically 2 (two) times daily.   ibuprofen 800 MG tablet Commonly known as:  ADVIL,MOTRIN Take 1 tablet (800 mg total) by mouth every 8 (eight) hours as needed.   INTERFERON ALFA-2B IJ Place into the right eye.   Melatonin 3 MG Tabs Take 1 tablet by mouth daily.   metroNIDAZOLE 500 MG tablet Commonly known as:  FLAGYL Take 1 tablet (500 mg total) by mouth 3 (three) times daily for 10 days.   multivitamin with minerals Tabs tablet Take 1 tablet by mouth every morning.   ondansetron 4 MG disintegrating tablet Commonly known as:  ZOFRAN-ODT Take 1 tablet by mouth every 8 (eight) hours as needed.   PARAGARD INTRAUTERINE COPPER  IU by Intrauterine route.   saccharomyces boulardii 250 MG capsule Commonly known as:  FLORASTOR Take 1 capsule (250 mg total) by mouth 2 (two) times daily.      No Known Allergies Follow-up Information    [place], Modena Nunnery, MD. Schedule an appointment as soon as possible for a visit in 2 week(s).   Specialty:  Family Medicine Contact information: [address], [place] [address] [phone]           The results of significant diagnostics from this hospitalization (including imaging, microbiology, ancillary and laboratory) are listed below for reference.    Significant Diagnostic Studies: Ct Abdomen Pelvis W Contrast  Result Date: 07/06/2017 CLINICAL DATA:  42 y/o F; abdominal pain, gastroenteritis or colitis suspected EXAM: CT ABDOMEN AND PELVIS WITH CONTRAST TECHNIQUE: Multidetector CT imaging of the abdomen and pelvis was performed using the standard protocol following bolus administration of intravenous contrast. CONTRAST:  100 cc Isovue 300 COMPARISON:  08/08/2014 abdomen MRI. 07/25/2014 CT abdomen and pelvis. FINDINGS: Lower chest: No acute abnormality. Hepatobiliary: No focal liver abnormality is seen. No gallstones, gallbladder wall  thickening, or biliary dilatation. Pancreas: Unremarkable. No pancreatic ductal dilatation or surrounding inflammatory changes. Spleen: Normal in size without focal abnormality. Adrenals/Urinary Tract: Adrenal glands are unremarkable. Stable right interpolar kidney 1.2 cm cyst better characterized on prior abdominal MRI. Multiple 2-3 mm nonobstructing stones in the left kidney. No hydronephrosis. Normal bladder. Stomach/Bowel: Stomach is within normal limits. Appendix appears normal. Nonspecific mildly prominent and fluid-filled loops of small bowel in the left hemiabdomen. No findings of obstruction. Normal appearance of the colon and stomach. Vascular/Lymphatic: No significant vascular findings are present. No enlarged abdominal or  pelvic lymph nodes. Reproductive: IUD well seated within the uterine fundus. Stable 11 mm cervical nabothian cyst. Other: No abdominal wall hernia or abnormality. No abdominopelvic ascites. Musculoskeletal: No acute or significant osseous findings. IMPRESSION: 1. Nonspecific mildly prominent fluid-filled loops of small bowel in the left hemiabdomen, possibly enteritis. 2. Multiple stable nonobstructing punctate stones in the left kidney. 3. IUD well seated within the uterine fundus. Electronically Signed   By: Kristine Garbe M.D.   On: 07/06/2017 21:59   Dg Chest Port 1 View  Result Date: 07/06/2017 CLINICAL DATA:  Shortness of breath. EXAM: PORTABLE CHEST 1 VIEW COMPARISON:  Radiographs of May 28, 2015. FINDINGS: The heart size and mediastinal contours are within normal limits. Both lungs are clear. No pneumothorax or pleural effusion is noted. The visualized skeletal structures are unremarkable. IMPRESSION: No acute cardiopulmonary abnormality seen. Electronically Signed   By: Marijo Conception, M.D.   On: 07/06/2017 20:49   Dg Abdomen Acute W/chest  Result Date: 07/08/2017 CLINICAL DATA:  Lower abdominal pain and nausea for 3 days. EXAM: DG ABDOMEN ACUTE W/ 1V CHEST COMPARISON:  None. FINDINGS: The cardiac silhouette, mediastinal and hilar contours are normal. The lungs are clear. No pleural effusion. There are dilated small bowel loops with air-fluid levels on the upright film consistent with small-bowel obstruction. There is some stool in the right colon but otherwise the colon appears decompressed. No free air. The soft tissue shadows are maintained. An IUD is noted in the central pelvis. Bony structures are unremarkable. IMPRESSION: 1. No acute cardiopulmonary findings. 2. Small-bowel obstruction bowel gas pattern.  No free air. Electronically Signed   By: Marijo Sanes M.D.   On: 07/08/2017 08:10   Labs: Basic Metabolic Panel: Recent Labs  Lab 07/06/17 1955 07/08/17 0610 07/09/17 0923   NA 137 140 137  K 3.2* 4.2 3.9  CL 106 105 112*  CO2 23 23 20*  GLUCOSE 101* 104* 119*  BUN 10 11 7   CREATININE 0.87 0.85 0.71  CALCIUM 9.2 9.6 8.0*  MG  --   --  1.8   Liver Function Tests: Recent Labs  Lab 07/06/17 1955 07/08/17 0610  AST 19 23  ALT 16 16  ALKPHOS 43 48  BILITOT 0.7 1.5*  PROT 7.4 8.0  ALBUMIN 4.3 4.4   Recent Labs  Lab 07/06/17 1955 07/08/17 0610  LIPASE 33 25   CBC: Recent Labs  Lab 07/06/17 1955 07/08/17 0610 07/09/17 0923  WBC 8.3 15.4* 8.5  NEUTROABS 5.4 13.4*  --   HGB 14.5 15.7* 12.7  HCT 44.0 46.8* 39.5  MCV 91.9 92.7 95.0  PLT 233 252 193   Cardiac Enzymes: Recent Labs  Lab 07/06/17 1955  TROPONINI <0.03    Signed:  Barton Dubois MD.  [facility] 07/09/2017, 4:26 PM

## 2017-07-14 ENCOUNTER — Encounter: Payer: Self-pay | Admitting: Family Medicine

## 2017-07-14 ENCOUNTER — Ambulatory Visit (INDEPENDENT_AMBULATORY_CARE_PROVIDER_SITE_OTHER): Payer: BLUE CROSS/BLUE SHIELD | Admitting: Family Medicine

## 2017-07-14 ENCOUNTER — Other Ambulatory Visit: Payer: Self-pay

## 2017-07-14 VITALS — BP 112/62 | HR 82 | Temp 98.3°F | Resp 14 | Ht 62.5 in | Wt 135.0 lb

## 2017-07-14 DIAGNOSIS — K529 Noninfective gastroenteritis and colitis, unspecified: Secondary | ICD-10-CM | POA: Diagnosis not present

## 2017-07-14 DIAGNOSIS — K56600 Partial intestinal obstruction, unspecified as to cause: Secondary | ICD-10-CM

## 2017-07-14 DIAGNOSIS — R609 Edema, unspecified: Secondary | ICD-10-CM | POA: Diagnosis not present

## 2017-07-14 LAB — CBC WITH DIFFERENTIAL/PLATELET
BASOS PCT: 0.6 %
Basophils Absolute: 29 cells/uL (ref 0–200)
EOS ABS: 139 {cells}/uL (ref 15–500)
Eosinophils Relative: 2.9 %
HCT: 38.4 % (ref 35.0–45.0)
Hemoglobin: 13.1 g/dL (ref 11.7–15.5)
Lymphs Abs: 1027 cells/uL (ref 850–3900)
MCH: 30.4 pg (ref 27.0–33.0)
MCHC: 34.1 g/dL (ref 32.0–36.0)
MCV: 89.1 fL (ref 80.0–100.0)
MONOS PCT: 6.2 %
MPV: 9 fL (ref 7.5–12.5)
Neutro Abs: 3307 cells/uL (ref 1500–7800)
Neutrophils Relative %: 68.9 %
PLATELETS: 320 10*3/uL (ref 140–400)
RBC: 4.31 10*6/uL (ref 3.80–5.10)
RDW: 12.1 % (ref 11.0–15.0)
TOTAL LYMPHOCYTE: 21.4 %
WBC mixed population: 298 cells/uL (ref 200–950)
WBC: 4.8 10*3/uL (ref 3.8–10.8)

## 2017-07-14 LAB — COMPREHENSIVE METABOLIC PANEL
AG Ratio: 1.7 (calc) (ref 1.0–2.5)
ALT: 10 U/L (ref 6–29)
AST: 16 U/L (ref 10–30)
Albumin: 4.1 g/dL (ref 3.6–5.1)
Alkaline phosphatase (APISO): 37 U/L (ref 33–115)
BUN: 7 mg/dL (ref 7–25)
CHLORIDE: 105 mmol/L (ref 98–110)
CO2: 28 mmol/L (ref 20–32)
CREATININE: 0.83 mg/dL (ref 0.50–1.10)
Calcium: 9.1 mg/dL (ref 8.6–10.2)
GLOBULIN: 2.4 g/dL (ref 1.9–3.7)
GLUCOSE: 109 mg/dL — AB (ref 65–99)
Potassium: 4 mmol/L (ref 3.5–5.3)
Sodium: 138 mmol/L (ref 135–146)
Total Bilirubin: 0.4 mg/dL (ref 0.2–1.2)
Total Protein: 6.5 g/dL (ref 6.1–8.1)

## 2017-07-14 MED ORDER — FUROSEMIDE 20 MG PO TABS: 20.0000 mg | ORAL_TABLET | Freq: Every day | ORAL | 0 refills | Status: DC

## 2017-07-14 NOTE — Progress Notes (Signed)
   Subjective:    Patient ID: Candice Estrada, female    DOB: 1975-06-17, 42 y.o.   MRN: [id number]  Patient presents for Edema (x4 days- B feet/ shins red and swollen- has been in ER for bowel blockage and was given a lot of fluids)   Pt here for Hospital f/u. Admitted secondary to enteritis and partial SBP, she had mild leukocytosis. Given Flagyl and Cipro for 10 days and florastor  She had actually went for her surgery for her eye   Since she left the hospital had swelling on feet since last Friday, went to the beach, came back on Saturday  no SOB, no Chest pain. No hives with antibiotics  Elevating her feet Was out in sun for 30 minutes on Thursday at the beach,next day had the redness   Has not been back to work yet, she is due to be out until July 9th  Only drops for eyes no oral meds- but currently off cycle for drops   Review Of Systems:  GEN- denies fatigue, fever, weight loss,weakness, recent illness HEENT- denies eye drainage, change in vision, nasal discharge, CVS- denies chest pain, palpitations RESP- denies SOB, cough, wheeze ABD- denies N/V, change in stools, abd pain GU- denies dysuria, hematuria, dribbling, incontinence MSK- denies joint pain, muscle aches, injury Neuro- denies headache, dizziness, syncope, seizure activity       Objective:    BP 112/62   Pulse 82   Temp 98.3 F (36.8 C) (Oral)   Resp 14   Ht 5' 2.5" (1.588 m)   Wt 135 lb (61.2 kg)   LMP 06/24/2017   SpO2 99%   BMI 24.30 kg/m  GEN- NAD, alert and oriented x3 HEENT- PERRL, EOMI, non injected sclera, pink conjunctiva, MMM, oropharynx clear Neck- Supple, no JVD CVS- RRR, no murmur RESP-CTAB ABD-NABS,soft,NT,ND EXT- non pitting  Edema to mid shins, erythema top of foot and shins, no bruising  Pulses- Radial, DP- 2+        Assessment & Plan:      Problem List Items Addressed This Visit      Unprioritized   Enteritis    Other Visit Diagnoses    Partial intestinal  obstruction, unspecified cause ([place])    -  Primary   Bowels are still a little sluggish, can add stool softner, will also get F/U with GI, complete antibotics   intereseting SBO with no Abdominal surgeries    Relevant Orders   CBC with Differential/Platelet   Comprehensive metabolic panel   Peripheral edema       Obtian STAT labs, ensure renal and liver function, WBC are normal, if so treat for dependent edema, legs look sunburned not like a cellulitis or medication reaction No sign of DVT  If above is all that I would give her Lasix 20 mg to take for a few days to get the fluid off.  We do have to watch her blood pressures as she is known to have hypotensive episodes.   Relevant Orders   CBC with Differential/Platelet   Comprehensive metabolic panel      Note: This dictation was prepared with Dragon dictation along with smaller phrase technology. Any transcriptional errors that result from this process are unintentional.

## 2017-07-14 NOTE — Patient Instructions (Addendum)
Referral to Dr. Oneida Alar  Lasix 49m once a day  F/U pending results

## 2017-07-16 ENCOUNTER — Other Ambulatory Visit: Payer: Self-pay

## 2017-07-16 ENCOUNTER — Encounter: Payer: Self-pay | Admitting: Family Medicine

## 2017-07-16 ENCOUNTER — Ambulatory Visit (INDEPENDENT_AMBULATORY_CARE_PROVIDER_SITE_OTHER): Payer: BLUE CROSS/BLUE SHIELD | Admitting: Family Medicine

## 2017-07-16 VITALS — BP 114/80 | HR 68 | Temp 98.7°F | Resp 14 | Ht 62.5 in | Wt 128.0 lb

## 2017-07-16 DIAGNOSIS — L559 Sunburn, unspecified: Secondary | ICD-10-CM

## 2017-07-16 DIAGNOSIS — R609 Edema, unspecified: Secondary | ICD-10-CM

## 2017-07-16 NOTE — Progress Notes (Signed)
   Subjective:    Patient ID: Candice Estrada, female    DOB: Aug 17, 1975, 42 y.o.   MRN: [id number]  Patient presents for Follow-up Pt here for intemrin f/u on leg swelling.  Seen 2 days ago.  Given Lasix for leg swelling she also had erythema on her feet and lower legs which look more like a sunburn.  There is been no change in the erythema on her feet but the legs have improved some. They still feel tight at the feet when she walks  She has had less pain.  The swelling has gone down but she does state that she still gets it in the evening time.  Her weight is down 7 pounds in the past 2 days.  She states that she did check her weight at home and also noted the weight loss.  She feels much better today.  She was also able to have a significant bowel movement which she had not had in a few days which may also contribute to some of the weight loss.   Review Of Systems:  GEN- denies fatigue, fever, weight loss,weakness, recent illness HEENT- denies eye drainage, change in vision, nasal discharge, CVS- denies chest pain, palpitations RESP- denies SOB, cough, wheeze ABD- denies N/V, change in stools, abd pain MSK- denies joint pain, muscle aches, injury Neuro- denies headache, dizziness, syncope, seizure activity       Objective:    BP 114/80   Pulse 68   Temp 98.7 F (37.1 C) (Oral)   Resp 14   Ht 5' 2.5" (1.588 m)   Wt 128 lb (58.1 kg)   LMP 06/24/2017   SpO2 98%   BMI 23.04 kg/m  GEN- NAD, alert and oriented x3 HEENT- PERRL, EOMI, non injected sclera, pink conjunctiva, MMM, oropharynx clear Neck- Supple, no thyromegaly CVS- RRR, no murmur RESP-CTAB ABD-NABS,soft,NT,ND ABD-NABS,soft,NT,ND EXT- trace ankle edema bilat, erythema top of foot , no bruising ,no warmth Pulses- Radial, DP- 2+        Assessment & Plan:      Problem List Items Addressed This Visit    None    Visit Diagnoses    Peripheral edema    -  Primary   I think edema MTF from sunburn to skin, IVF  resusciation and being in bed while hospialized. now weight and edema down. will try silvadene to feet/leg. will see how she does.   Sunburn          Note: This dictation was prepared with Dragon dictation along with smaller phrase technology. Any transcriptional errors that result from this process are unintentional.

## 2017-07-16 NOTE — Patient Instructions (Signed)
Cancel appt for July 16th F/U as needed

## 2017-07-21 ENCOUNTER — Telehealth: Payer: Self-pay | Admitting: Family Medicine

## 2017-07-21 ENCOUNTER — Telehealth: Payer: Self-pay | Admitting: *Deleted

## 2017-07-21 ENCOUNTER — Encounter: Payer: Self-pay | Admitting: Gastroenterology

## 2017-07-21 NOTE — Telephone Encounter (Signed)
Call placed to patient to inquire.  States that she only had BM on 7/3 d/t using mild laxative. Advised that bowels may remain sluggish for a while after partial obstruction. Advised that she can use mild laxative q 3 days as needed and stool softener daily until back to baseline.   Patient also states that GI appointment has been scheduled, but it is in October. Requested that we try to schedule sooner.    Also, patient states that she is going to try to go back to work on 07/22/2017. States that if her edema/ pain become unbearable though, she will need note to leave. Ok to write note?

## 2017-07-21 NOTE — Telephone Encounter (Signed)
Pt is still having issues with her feet being purple/ red and burning sensation they are not swelling anymore, still has not had a bowel movement and July 3rd. Pt wants work note saying that she can try to return on 07/22/2017 but if she has to leave she may need note for tomorrow if she is still having burning sensation.

## 2017-07-22 NOTE — Telephone Encounter (Signed)
Call placed to patient to inquire as to how she is faring today. [place].

## 2017-07-22 NOTE — Telephone Encounter (Signed)
Give note for work to [place] 7/9

## 2017-07-22 NOTE — Telephone Encounter (Signed)
noted 

## 2017-07-22 NOTE — Telephone Encounter (Signed)
Received fax from [employer] for [employer] forms.   Call placed to patient for more information.   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   Forms routed to provider.

## 2017-07-23 NOTE — Telephone Encounter (Signed)
Multiple calls placed to patient with no answer and no return call.   Message to be closed.

## 2017-07-29 ENCOUNTER — Inpatient Hospital Stay: Payer: BLUE CROSS/BLUE SHIELD | Admitting: Family Medicine

## 2017-07-30 ENCOUNTER — Telehealth: Payer: Self-pay | Admitting: *Deleted

## 2017-07-30 NOTE — Telephone Encounter (Signed)
Received call from patient.   States that she continues to have abd pain and is not having BM without use of laxative. Reports that she is also having increased issues with dehydration. 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 Reports that she is sometimes working 10-11 hours days helping others complete their routes.   Requested note for work stating that she can only work her 8 hour day and complete her route.   MD please advise.

## 2017-07-30 NOTE — Telephone Encounter (Signed)
Call placed to patient. Candice Estrada.

## 2017-07-30 NOTE — Telephone Encounter (Signed)
She would need OV, to document, before restrictions can be put I place

## 2017-07-31 NOTE — Telephone Encounter (Signed)
Call placed to patient and patient made aware per VM.

## 2017-08-08 DIAGNOSIS — Z8719 Personal history of other diseases of the digestive system: Secondary | ICD-10-CM | POA: Diagnosis not present

## 2017-08-08 DIAGNOSIS — K5900 Constipation, unspecified: Secondary | ICD-10-CM | POA: Diagnosis not present

## 2017-08-08 DIAGNOSIS — R194 Change in bowel habit: Secondary | ICD-10-CM | POA: Diagnosis not present

## 2017-08-08 DIAGNOSIS — A048 Other specified bacterial intestinal infections: Secondary | ICD-10-CM | POA: Diagnosis not present

## 2017-08-14 DIAGNOSIS — L570 Actinic keratosis: Secondary | ICD-10-CM | POA: Diagnosis not present

## 2017-09-02 DIAGNOSIS — R194 Change in bowel habit: Secondary | ICD-10-CM | POA: Diagnosis not present

## 2017-09-02 DIAGNOSIS — K228 Other specified diseases of esophagus: Secondary | ICD-10-CM | POA: Diagnosis not present

## 2017-09-02 DIAGNOSIS — B9681 Helicobacter pylori [H. pylori] as the cause of diseases classified elsewhere: Secondary | ICD-10-CM | POA: Diagnosis not present

## 2017-09-02 DIAGNOSIS — Q438 Other specified congenital malformations of intestine: Secondary | ICD-10-CM | POA: Diagnosis not present

## 2017-09-02 DIAGNOSIS — K2930 Chronic superficial gastritis without bleeding: Secondary | ICD-10-CM | POA: Diagnosis not present

## 2017-09-02 DIAGNOSIS — K6289 Other specified diseases of anus and rectum: Secondary | ICD-10-CM | POA: Diagnosis not present

## 2017-09-02 DIAGNOSIS — D126 Benign neoplasm of colon, unspecified: Secondary | ICD-10-CM | POA: Diagnosis not present

## 2017-09-02 DIAGNOSIS — K5900 Constipation, unspecified: Secondary | ICD-10-CM | POA: Diagnosis not present

## 2017-09-08 DIAGNOSIS — D126 Benign neoplasm of colon, unspecified: Secondary | ICD-10-CM | POA: Diagnosis not present

## 2017-09-08 DIAGNOSIS — K6289 Other specified diseases of anus and rectum: Secondary | ICD-10-CM | POA: Diagnosis not present

## 2017-09-08 DIAGNOSIS — K228 Other specified diseases of esophagus: Secondary | ICD-10-CM | POA: Diagnosis not present

## 2017-09-17 DIAGNOSIS — K5900 Constipation, unspecified: Secondary | ICD-10-CM | POA: Diagnosis not present

## 2017-09-17 DIAGNOSIS — Z86010 Personal history of colonic polyps: Secondary | ICD-10-CM | POA: Diagnosis not present

## 2017-09-17 DIAGNOSIS — Z8619 Personal history of other infectious and parasitic diseases: Secondary | ICD-10-CM | POA: Diagnosis not present

## 2017-09-24 DIAGNOSIS — D4989 Neoplasm of unspecified behavior of other specified sites: Secondary | ICD-10-CM | POA: Diagnosis not present

## 2017-10-17 ENCOUNTER — Encounter: Payer: Self-pay | Admitting: Gastroenterology

## 2017-10-17 ENCOUNTER — Ambulatory Visit: Payer: BLUE CROSS/BLUE SHIELD | Admitting: Gastroenterology

## 2017-11-10 DIAGNOSIS — H11001 Unspecified pterygium of right eye: Secondary | ICD-10-CM | POA: Diagnosis not present

## 2017-11-10 DIAGNOSIS — H18891 Other specified disorders of cornea, right eye: Secondary | ICD-10-CM | POA: Diagnosis not present

## 2017-11-10 DIAGNOSIS — H11051 Peripheral pterygium, progressive, right eye: Secondary | ICD-10-CM | POA: Diagnosis not present

## 2017-11-14 ENCOUNTER — Other Ambulatory Visit: Payer: Self-pay | Admitting: Adult Health

## 2017-11-14 MED ORDER — IBUPROFEN 800 MG PO TABS: 800.0000 mg | ORAL_TABLET | Freq: Three times a day (TID) | ORAL | 2 refills | Status: DC | PRN

## 2017-11-14 NOTE — Progress Notes (Signed)
Refill motrin

## 2017-11-24 ENCOUNTER — Other Ambulatory Visit: Payer: Self-pay | Admitting: *Deleted

## 2017-11-24 MED ORDER — IBUPROFEN 800 MG PO TABS: 800.0000 mg | ORAL_TABLET | Freq: Three times a day (TID) | ORAL | 4 refills | Status: DC | PRN

## 2017-12-02 ENCOUNTER — Ambulatory Visit: Payer: BLUE CROSS/BLUE SHIELD | Admitting: Family Medicine

## 2017-12-03 DIAGNOSIS — Z9889 Other specified postprocedural states: Secondary | ICD-10-CM | POA: Diagnosis not present

## 2017-12-24 ENCOUNTER — Encounter: Payer: Self-pay | Admitting: Family Medicine

## 2018-01-12 ENCOUNTER — Other Ambulatory Visit: Payer: Self-pay | Admitting: Orthopaedic Surgery

## 2018-01-12 DIAGNOSIS — M25572 Pain in left ankle and joints of left foot: Secondary | ICD-10-CM

## 2018-01-16 ENCOUNTER — Ambulatory Visit
Admission: RE | Admit: 2018-01-16 | Discharge: 2018-01-16 | Disposition: A | Discharge status: Home / Self Care | Payer: Worker's Compensation | Source: Ambulatory Visit | Attending: Orthopaedic Surgery | Admitting: Orthopaedic Surgery

## 2018-01-16 DIAGNOSIS — M25572 Pain in left ankle and joints of left foot: Secondary | ICD-10-CM

## 2018-01-28 ENCOUNTER — Encounter: Payer: Self-pay | Admitting: Physician Assistant

## 2018-01-28 NOTE — Progress Notes (Signed)
Cardiology Office Note    Date:  01/30/2018  ID:  Candice Estrada, DOB 29-Dec-1975, MRN [id number] PCP:  Alycia Rossetti, MD  Cardiologist:  Sherren Mocha, MD/Steve Caryl Comes MD   Chief Complaint: dizzness, chest pain  History of Present Illness:  Candice Estrada is a 43 y.o. female with history of transcatheter ASD closure in 2012 after presenting with a TIA in the setting of a small ostium secundum ASD, POTS/NSVT (followed by Dr. Caryl Comes), migraines, B12 deficiency, ocular surface squamous neoplasia, SBO 06/2017 who presents for evaluation of chest pain and dizziness.  She was remotely followed by Dr. Burt Knack for ASD closure but most of her care has been with Dr. Caryl Comes with unusual presentations of recurrent palpitations and near-syncope. Dr. Caryl Comes indicated possible POTS (although see below - possible relationship to ear). He also previously suggested possible neurologic component. Regarding h/o palpitations and near syncope, Florinef, proamantine, and beta blocker have not been helpful. Her symptoms were previously aggravated by temperature changes and menses. Signal average EKG was normal. Zio patch in 2015 showed PACs and 4 beat run NSVT. Monitor in 12/2015 showed isolated PACs and one ventricular couplet noted; symptoms correlated with NSR though. 2D echo 07/2013 showed EF 55-60%, ASD closure appeared normal. MRI of the brain in 02/2015 was normal. 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 Last lab 07/2017 showed Hgb 13.1, Plt 320, CMET wnl except glucose 109; 10/2016 LDL 86 and TSH wnl.  She has been under increased stress lately related to 19 y/o son and diagnosis of eye neoplasm. She reports palpitations have recurrent and are more frequent, worse when lying down at night, described as heart racing. They are associated with SOB. She has not had any syncope. She also reports on NYE she had an  episode of significant chest discomfort associated with nausea, vomiting, and dizziness. This too was under an episode of stress so she is not sure if it was acutely related. She has since had an episode of random chest pressure about once a week occurring at random. She has been out of work since November dealing with eye issue. 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 She has since gradually begun using elliptical without issues. She is concerned because her dad had CAD diagnosed at age 64. Orthostatic vitals signs showed Laying 96/71 pulse 81, sitting 112/69, pulse 73, standing 110/73, pulse 88, standing 33mn 106/75, pulse 90 (so negative).   Past Medical History:  Diagnosis Date  . Anxiety   . ASD (atrial septal defect), ostium secundum    Status post closure 08/20/10 with Dr. CBurt Knack procedure complicated by right groin hematoma and acute blood loss anemia  . BV (bacterial vaginosis) 10/06/2014  . Complication of anesthesia    VASO-VAGAL RESPONSE IN RECOVERY ROOM AFTER HEART SURGERY  . Dysrhythmia    A-FIB  . Hematuria   . History of psoriasis    scalp  . History of TIAs   . Hx of dizziness    random episodes "because my BP is so low"  . Hypotension   . IUD (intrauterine device) in place 12/16/2012  . Kidney stone   . Meniere's disease   . Night terrors   . NSVT (nonsustained ventricular tachycardia) (HCC)    a. 4 beat run on Zio 2015.  .Marland KitchenOcular neoplasm   . Ovarian cyst   .  POTS (postural orthostatic tachycardia syndrome)   . SBO (small bowel obstruction) ([facility])   . 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 Thyroid enlarged 12/16/2012   History of cyst will get [place]    Past Surgical History:  Procedure Laterality Date  . ASD REPAIR  08/20/2010  . CARDIAC SURGERY    . CYSTOSCOPY WITH RETROGRADE PYELOGRAM, URETEROSCOPY AND STENT PLACEMENT Left 08/31/2014   Procedure: CYSTOSCOPY WITH BILATERAL RETROGRADE PYELOGRAM, URETEROSCOPY AND STENT PLACEMENT, STONE  EXTRACTION WITH BASKET;  Surgeon: Cleon Gustin, MD;  Location: [facility];  Service: Urology;  Laterality: Left;  . FOOT NEUROMA SURGERY  2009   left  . kidney stone removed    . THYROID CYST EXCISION    . TONSILLECTOMY      Current Medications: Current Meds  Medication Sig  . buPROPion (WELLBUTRIN SR) 150 MG 12 hr tablet TAKE 1 TABLET DAILY  . fluorouracil (EFUDEX) 5 % cream Apply 1 application topically 2 (two) times daily.  Marland Kitchen ibuprofen (ADVIL,MOTRIN) 800 MG tablet Take 1 tablet (800 mg total) by mouth every 8 (eight) hours as needed.  . Melatonin 3 MG TABS Take 1 tablet by mouth daily.  . Multiple Vitamin (MULTIVITAMIN WITH MINERALS) TABS Take 1 tablet by mouth every morning.   Marland Kitchen PARAGARD INTRAUTERINE COPPER IU by Intrauterine route.  . saccharomyces boulardii (FLORASTOR) 250 MG capsule Take 1 capsule (250 mg total) by mouth 2 (two) times daily.      Allergies:   Patient has no known allergies.   Social History   Socioeconomic History  . Marital status: Married    Spouse name: Not on file  . Number of children: 2  . Years of education: College  . Highest education level: Not on file  Occupational History  . 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 Financial resource strain: Not on file  . Food insecurity:    Worry: Not on file    Inability: Not on file  . Transportation needs:    Medical: Not on file    Non-medical: Not on file  Tobacco Use  . Smoking status: Never Smoker  . Smokeless tobacco: Never Used  Substance and Sexual Activity  . Alcohol use: Yes    Alcohol/week: 0.0 standard drinks    Comment: very rarely  . Drug use: No  . Sexual activity: Yes    Birth control/protection: I.U.D.  Lifestyle  . Physical activity:    Days per week: Not on file    Minutes per session: Not on file  . Stress: Not on file  Relationships  . Social connections:    Talks on phone: Not on file    Gets together:  Not on file    Attends religious service: Not on file    Active member of club or organization: Not on file    Attends meetings of clubs or organizations: Not on file    Relationship status: Not on file  Other Topics Concern  . Not on file  Social History Narrative   Patient lives at home with her family.   Caffeine Use: rarely     Family History:  The patient's family history includes Brain cancer in her father; Cancer in her maternal grandmother, mother, paternal aunt, paternal grandmother, and another family member; Dementia in her maternal grandmother; Diabetes in her father; Heart attack in her father; Heart disease in her father; Hypertension (age  of onset: 37) in her brother; Hypertension (age of onset: 52) in her father; Kidney failure in her daughter; Liver disease (age of onset: 54) in her mother; Stroke in her father.  ROS:   Please see the history of present illness.  All other systems are reviewed and otherwise negative.    PHYSICAL EXAM:   VS:  BP 98/78   Pulse 75   Ht 5' 2.5" (1.588 m)   Wt 146 lb 6.4 oz (66.4 kg)   SpO2 98%   BMI 26.35 kg/m   BMI: Body mass index is 26.35 kg/m. GEN: Well nourished, well developed WF, in no acute distress HEENT: normocephalic, atraumatic Neck: no JVD, carotid bruits, or masses Cardiac: RRR; no murmurs, rubs, or gallops, no edema  Respiratory:  clear to auscultation bilaterally, normal work of breathing GI: soft, nontender, nondistended, + BS MS: no deformity or atrophy Skin: warm and dry, no rash Neuro:  Alert and Oriented x 3, Strength and sensation are intact, follows commands Psych: euthymic mood, full affect  Wt Readings from Last 3 Encounters:  01/30/18 146 lb 6.4 oz (66.4 kg)  07/16/17 128 lb (58.1 kg)  07/14/17 135 lb (61.2 kg)      Studies/Labs Reviewed:   EKG:  EKG was ordered today and personally reviewed by me and demonstrates NSR 75bpm, no acute ST-T changes  Recent Labs: 07/09/2017: Magnesium  1.8 07/14/2017: ALT 10; BUN 7; Creat 0.83; Hemoglobin 13.1; Platelets 320; Potassium 4.0; Sodium 138   Lipid Panel    Component Value Date/Time   CHOL 158 10/21/2016 1010   TRIG 63 10/21/2016 1010   HDL 59 10/21/2016 1010   CHOLHDL 2.7 10/21/2016 1010   CHOLHDL 2.8 09/09/2012 0910   VLDL 15 09/09/2012 0910   LDLCALC 86 10/21/2016 1010    Additional studies/ records that were reviewed today include: Summarized above   ASSESSMENT & PLAN:   1. Chest pain - her episode on NYE sounds somewhat concerning but subsequent episodes have been atypical. EKG is normal. Given family history, will undertake stress echocardiogram since it would be helpful to also have reassessment of LV function and ASD closure. If unrevealing, would suggest she enact measures to help reduce stress. She herself is cognizant that this could be affecting her health and wellbeing. 2. Palpitations - plan 14 day monitor. She's not sure if they happen often enough to capture on 48 hour monitor. Will also update labs to ensure to inciting metabolic factor. 3. POTS - seems less of an issue these days. She seems to have chronically low BP. She also has other somatic complaints such as violaceous appearance to legs if they are hanging down. She has never been evaluated for adrenal insufficiency. We do not perform this in our office as we do not have the equipment to perform ACTH stim testing but it may be worthwhile for her to review with PCP. I will route this note to her primary care per her request. 4. ASD closure - will update echocardiogram.  Disposition: F/u with me in 6 weeks.  Medication Adjustments/Labs and Tests Ordered: Current medicines are reviewed at length with the patient today.  Concerns regarding medicines are outlined above. Medication changes, Labs and Tests ordered today are summarized above and listed in the Patient Instructions accessible in Encounters.   Signed, Charlie Pitter, PA-C  01/30/2018 10:25 AM     [facility] [place], [place], [place]  [address] Phone: [phone]; Fax: [phone]

## 2018-01-30 ENCOUNTER — Ambulatory Visit (INDEPENDENT_AMBULATORY_CARE_PROVIDER_SITE_OTHER): Payer: BLUE CROSS/BLUE SHIELD | Admitting: Physician Assistant

## 2018-01-30 ENCOUNTER — Telehealth: Payer: Self-pay | Admitting: *Deleted

## 2018-01-30 ENCOUNTER — Encounter: Payer: Self-pay | Admitting: Physician Assistant

## 2018-01-30 VITALS — BP 98/78 | HR 75 | Ht 62.5 in | Wt 146.4 lb

## 2018-01-30 DIAGNOSIS — R079 Chest pain, unspecified: Secondary | ICD-10-CM

## 2018-01-30 DIAGNOSIS — E2740 Unspecified adrenocortical insufficiency: Secondary | ICD-10-CM

## 2018-01-30 DIAGNOSIS — I498 Other specified cardiac arrhythmias: Secondary | ICD-10-CM

## 2018-01-30 DIAGNOSIS — I951 Orthostatic hypotension: Secondary | ICD-10-CM | POA: Diagnosis not present

## 2018-01-30 DIAGNOSIS — R002 Palpitations: Secondary | ICD-10-CM

## 2018-01-30 DIAGNOSIS — R000 Tachycardia, unspecified: Secondary | ICD-10-CM

## 2018-01-30 DIAGNOSIS — Z8774 Personal history of (corrected) congenital malformations of heart and circulatory system: Secondary | ICD-10-CM | POA: Diagnosis not present

## 2018-01-30 DIAGNOSIS — G90A Postural orthostatic tachycardia syndrome (POTS): Secondary | ICD-10-CM

## 2018-01-30 NOTE — Patient Instructions (Addendum)
Medication Instructions:  Your physician recommends that you continue on your current medications as directed. Please refer to the Current Medication list given to you today.  If you need a refill on your cardiac medications before your next appointment, please call your pharmacy.   Lab work: TODAY:  BMET, MAG, CBC, & TSH  If you have labs (blood work) drawn today and your tests are completely normal, you will receive your results only by: Marland Kitchen MyChart Message (if you have MyChart) OR . A paper copy in the mail If you have any lab test that is abnormal or we need to change your treatment, we will call you to review the results.  Testing/Procedures: Your physician has requested that you have a stress echocardiogram. For further information please visit [email or web address]. Please follow instruction sheet as given.  Your physician has requested that you have an echocardiogram. Echocardiography is a painless test that uses sound waves to create images of your heart. It provides your doctor with information about the size and shape of your heart and how well your heart's chambers and valves are working. This procedure takes approximately one hour. There are no restrictions for this procedure.  Your physician has recommended that you wear a LONGTERM [place] monitor.    Follow-Up: Your physician recommends that you schedule a follow-up appointment in: [place], PA-C  Any Other Special Instructions Will Be Listed Below (If Applicable).  Exercise Stress Echocardiogram, Care After This sheet gives you information about how to care for yourself after your procedure. Your doctor may also give you more specific instructions. If you have problems or questions, call your doctor. Follow these instructions at home:   You may do these things as told by your doctor: ? Eat what you normally eat. ? Do your normal activities. ? Take your normal medicines.  Take over-the-counter and  prescription medicines only as told by your doctor.  Keep all follow-up visits as told by your doctor. This is important. Contact a doctor if:  You keep feeling dizzy or light-headed.  You feel like your heart is beating fast.  You keep feeling sick to your stomach (nauseous) or you throw up (vomit).  You have a headache.  You feel short of breath. Get help right away if:  You have pain or pressure in any of these areas: ? Your chest. ? Your jaw or neck. ? Between your shoulder blades. ? Down your left arm.  You pass out (faint).  You have trouble breathing. Summary  After your procedure, you may eat like normal, do your normal activities, and take your normal medicines as told by your doctor.  Contact your doctor if you have dizziness, a fast heartbeat, or a headache.  You should also contact your doctor if you feel sick to your stomach (nauseous), you throw up (vomit), or you feel short of breath.  Get help right away if you feel pain or pressure in any of these areas: your chest, jaw, neck, between your shoulder blades, or down your right arm.  You should also get help right away if you pass out (faint) or have trouble breathing. This information is not intended to replace advice given to you by your health care provider. Make sure you discuss any questions you have with your health care provider. Document Released: 10/21/2012 Document Revised: 09/25/2015 Document Reviewed: 09/25/2015 Elsevier Interactive Patient Education  2019 [employer].   Echocardiogram An echocardiogram is a procedure that uses painless sound  waves (ultrasound) to produce an image of the heart. Images from an echocardiogram can provide important information about:  Signs of coronary artery disease (CAD).  Aneurysm detection. An aneurysm is a weak or damaged part of an artery wall that bulges out from the normal force of blood pumping through the body.  Heart size and shape. Changes in the  size or shape of the heart can be associated with certain conditions, including heart failure, aneurysm, and CAD.  Heart muscle function.  Heart valve function.  Signs of a past heart attack.  Fluid buildup around the heart.  Thickening of the heart muscle.  A tumor or infectious growth around the heart valves. Tell a health care provider about:  Any allergies you have.  All medicines you are taking, including vitamins, herbs, eye drops, creams, and over-the-counter medicines.  Any blood disorders you have.  Any surgeries you have had.  Any medical conditions you have.  Whether you are pregnant or may be pregnant. What are the risks? Generally, this is a safe procedure. However, problems may occur, including:  Allergic reaction to dye (contrast) that may be used during the procedure. What happens before the procedure? No specific preparation is needed. You may eat and drink normally. What happens during the procedure?   An IV tube may be inserted into one of your veins.  You may receive contrast through this tube. A contrast is an injection that improves the quality of the pictures from your heart.  A gel will be applied to your chest.  A wand-like tool (transducer) will be moved over your chest. The gel will help to transmit the sound waves from the transducer.  The sound waves will harmlessly bounce off of your heart to allow the heart images to be captured in real-time motion. The images will be recorded on a computer. The procedure may vary among health care providers and hospitals. What happens after the procedure?  You may return to your normal, everyday life, including diet, activities, and medicines, unless your health care provider tells you not to do that. Summary  An echocardiogram is a procedure that uses painless sound waves (ultrasound) to produce an image of the heart.  Images from an echocardiogram can provide important information about the size  and shape of your heart, heart muscle function, heart valve function, and fluid buildup around your heart.  You do not need to do anything to prepare before this procedure. You may eat and drink normally.  After the echocardiogram is completed, you may return to your normal, everyday life, unless your health care provider tells you not to do that. This information is not intended to replace advice given to you by your health care provider. Make sure you discuss any questions you have with your health care provider. Document Released: 12/29/1999 Document Revised: 02/03/2016 Document Reviewed: 02/03/2016 Elsevier Interactive Patient Education  2019 [employer].

## 2018-01-30 NOTE — Telephone Encounter (Signed)
-----   Message from Alycia Rossetti, MD sent at 01/30/2018 12:51 PM EST ----- Regarding: Call pt, I can refer her to endocrine to evaluated for adrenal insuffiency based on cardiology note Referral  DX- POTS syndrome, adrenal insufficiency

## 2018-01-30 NOTE — Telephone Encounter (Signed)
Call placed to patient and patient made aware.  Patient is agreeable to plan.   Referral orders placed.

## 2018-01-31 LAB — TSH: TSH: 1.49 u[IU]/mL (ref 0.450–4.500)

## 2018-01-31 LAB — CBC
Hematocrit: 43.1 % (ref 34.0–46.6)
Hemoglobin: 14.7 g/dL (ref 11.1–15.9)
MCH: 30.5 pg (ref 26.6–33.0)
MCHC: 34.1 g/dL (ref 31.5–35.7)
MCV: 89 fL (ref 79–97)
PLATELETS: 276 10*3/uL (ref 150–450)
RBC: 4.82 x10E6/uL (ref 3.77–5.28)
RDW: 12.5 % (ref 11.7–15.4)
WBC: 10.1 10*3/uL (ref 3.4–10.8)

## 2018-01-31 LAB — BASIC METABOLIC PANEL
BUN / CREAT RATIO: 8 — AB (ref 9–23)
BUN: 8 mg/dL (ref 6–24)
CO2: 21 mmol/L (ref 20–29)
CREATININE: 0.96 mg/dL (ref 0.57–1.00)
Calcium: 9.2 mg/dL (ref 8.7–10.2)
Chloride: 103 mmol/L (ref 96–106)
GFR calc Af Amer: 83 mL/min/{1.73_m2} (ref >59)
GFR, EST NON AFRICAN AMERICAN: 72 mL/min/{1.73_m2} (ref >59)
Glucose: 81 mg/dL (ref 65–99)
Potassium: 4.1 mmol/L (ref 3.5–5.2)
SODIUM: 141 mmol/L (ref 134–144)

## 2018-01-31 LAB — MAGNESIUM: Magnesium: 2.1 mg/dL (ref 1.6–2.3)

## 2018-02-03 ENCOUNTER — Other Ambulatory Visit: Payer: Self-pay

## 2018-02-03 ENCOUNTER — Other Ambulatory Visit (HOSPITAL_COMMUNITY): Payer: Self-pay

## 2018-02-03 ENCOUNTER — Ambulatory Visit (HOSPITAL_COMMUNITY): Payer: BLUE CROSS/BLUE SHIELD | Attending: Cardiovascular Disease

## 2018-02-03 DIAGNOSIS — Z8774 Personal history of (corrected) congenital malformations of heart and circulatory system: Secondary | ICD-10-CM

## 2018-02-05 ENCOUNTER — Encounter: Payer: Self-pay | Admitting: Family Medicine

## 2018-02-05 ENCOUNTER — Ambulatory Visit (INDEPENDENT_AMBULATORY_CARE_PROVIDER_SITE_OTHER): Payer: BLUE CROSS/BLUE SHIELD | Admitting: Family Medicine

## 2018-02-05 VITALS — BP 126/80 | HR 73 | Temp 98.3°F | Resp 16 | Ht 62.5 in | Wt 143.1 lb

## 2018-02-05 DIAGNOSIS — J209 Acute bronchitis, unspecified: Secondary | ICD-10-CM | POA: Diagnosis not present

## 2018-02-05 DIAGNOSIS — J069 Acute upper respiratory infection, unspecified: Secondary | ICD-10-CM | POA: Diagnosis not present

## 2018-02-05 DIAGNOSIS — J9801 Acute bronchospasm: Secondary | ICD-10-CM

## 2018-02-05 MED ORDER — CETIRIZINE HCL 10 MG PO TBDP: 10.0000 mg | ORAL_TABLET | Freq: Every day | ORAL | 0 refills | Status: DC

## 2018-02-05 MED ORDER — HYDROCODONE-HOMATROPINE 5-1.5 MG/5ML PO SYRP: 5.0000 mL | ORAL_SOLUTION | Freq: Three times a day (TID) | ORAL | 0 refills | Status: DC | PRN

## 2018-02-05 MED ORDER — ALBUTEROL SULFATE (2.5 MG/3ML) 0.083% IN NEBU: 2.5000 mg | INHALATION_SOLUTION | Freq: Four times a day (QID) | RESPIRATORY_TRACT | 1 refills | Status: DC | PRN

## 2018-02-05 MED ORDER — ALBUTEROL SULFATE 108 (90 BASE) MCG/ACT IN AEPB: 2.0000 | INHALATION_SPRAY | RESPIRATORY_TRACT | 0 refills | Status: DC | PRN

## 2018-02-05 MED ORDER — PREDNISONE 20 MG PO TABS: ORAL_TABLET | ORAL | 0 refills | Status: DC

## 2018-02-05 MED ORDER — MOMETASONE FUROATE 50 MCG/ACT NA SUSP: 2.0000 | Freq: Every day | NASAL | 1 refills | Status: DC

## 2018-02-05 MED ORDER — BENZONATATE 100 MG PO CAPS: 100.0000 mg | ORAL_CAPSULE | Freq: Three times a day (TID) | ORAL | 0 refills | Status: DC | PRN

## 2018-02-05 NOTE — Progress Notes (Signed)
Patient ID: Candice Estrada, female    DOB: 02-27-1975, 43 y.o.   MRN: [id number]  PCP: Alycia Rossetti, MD  Chief Complaint  Patient presents with  . Cough    Patient in with c/o cough, headache, hoarseness, sore throat    Subjective:   Candice Estrada is a 43 y.o. female, presents to clinic with CC of uri sx with fatigue and SOB URI   This is a new problem. The current episode started 1 to 4 weeks ago. The problem has been gradually worsening. There has been no fever. Associated symptoms include congestion, coughing, headaches, a plugged ear sensation, rhinorrhea, sinus pain, a sore throat and wheezing. Pertinent negatives include no chest pain, diarrhea, dysuria or neck pain. Treatments tried: otc meds. The treatment provided no relief.  Cough  This is a new problem. The current episode started 1 to 4 weeks ago. The problem has been gradually worsening. Associated symptoms include headaches, rhinorrhea, a sore throat and wheezing. Pertinent negatives include no chest pain.      Patient Active Problem List   Diagnosis Date Noted  . Small bowel obstruction ([place]) 07/08/2017  . Partial small bowel obstruction ([place]) 07/08/2017  . Enteritis of infectious origin 07/08/2017  . Hypokalemia 07/08/2017  . Enteritis 07/08/2017  . Dehydration   . History of thyroid nodule 10/21/2016  . Vaginal discharge 10/06/2014  . BV (bacterial vaginosis) 10/06/2014  . Ovarian cyst 01/26/2014  . Pelvic pain in female 01/19/2014  . Anxiety 01/19/2014  . Fecal occult blood test positive 01/19/2014  . Night terrors, adult 02/23/2013  . IUD (intrauterine device) in place 12/16/2012  . Thyroid enlarged 12/16/2012  . Well woman exam with routine gynecological exam 09/09/2012  . Irregular bleeding 08/03/2012  . Syncope and collapse 04/26/2011  . Fatigue 02/06/2011  . POTS (postural orthostatic tachycardia syndrome) 09/15/2010  . TIA (transient ischemic attack) 09/15/2010  . Dizziness  09/10/2010  . Hypotension 09/10/2010  . Palpitations 09/06/2010  . Atrial septal defect, secundum 08/10/2010  . OVARIAN CYST, RIGHT 12/28/2007  . Anxiety state 12/03/2005     Prior to Admission medications   Medication Sig Start Date End Date Taking? Authorizing Provider  buPROPion [facility] SR) 150 MG 12 hr tablet TAKE 1 TABLET DAILY 02/24/17  Yes Derrek Monaco A, NP  ibuprofen (ADVIL,MOTRIN) 800 MG tablet Take 1 tablet (800 mg total) by mouth every 8 (eight) hours as needed. 11/24/17  Yes Florian Buff, MD  Melatonin 3 MG TABS Take 1 tablet by mouth daily.   Yes [provider]  Multiple Vitamin (MULTIVITAMIN WITH MINERALS) TABS Take 1 tablet by mouth every morning.    Yes [provider]  PARAGARD INTRAUTERINE COPPER IU by Intrauterine route.   Yes [provider]  saccharomyces boulardii (FLORASTOR) 250 MG capsule Take 1 capsule (250 mg total) by mouth 2 (two) times daily. 07/09/17  Yes Barton Dubois, MD  Albuterol Sulfate (PROAIR RESPICLICK) 001 (90 Base) MCG/ACT AEPB Inhale 2 puffs into the lungs every 4 (four) hours as needed (for wheeze, chest tightness or SOB). 02/05/18   Delsa Grana, PA-C  benzonatate (TESSALON) 100 MG capsule Take 1 capsule (100 mg total) by mouth 3 (three) times daily as needed for cough. 02/05/18   Delsa Grana, PA-C  Cetirizine HCl (ZYRTEC ALLERGY) 10 MG TBDP Take 10 mg by mouth at bedtime. 02/05/18   Delsa Grana, PA-C  fluorouracil (EFUDEX) 5 % cream Apply 1 application topically 2 (two) times daily. 05/20/17  [provider]  HYDROcodone-homatropine (HYCODAN) 5-1.5 MG/5ML syrup Take 5 mLs by mouth every 8 (eight) hours as needed for cough. 02/05/18   Delsa Grana, PA-C  mometasone (NASONEX) 50 MCG/ACT nasal spray Place 2 sprays into the nose daily. 02/05/18   Delsa Grana, PA-C  predniSONE (DELTASONE) 20 MG tablet 3 tabs poqday 1-3, 2 tabs poqday 4-6, 1 tab poqday 7-9 02/05/18   Delsa Grana, PA-C     No Known  Allergies   Family History  Problem Relation Age of Onset  . Hypertension Father 71  . Stroke Father   . Heart attack Father   . Brain cancer Father   . Heart disease Father   . Diabetes Father   . Liver disease Mother 51       dead  . Cancer Mother        breast  . Hypertension Brother 17  . Cancer Paternal Grandmother        breast  . Dementia Maternal Grandmother   . Cancer Maternal Grandmother        thyroid  . Cancer Paternal Aunt        breast   . Kidney failure Daughter   . Cancer Other        mom's grandma-breast     Social History   Socioeconomic History  . Marital status: Married    Spouse name: Not on file  . Number of children: 2  . Years of education: College  . Highest education level: Not on file  Occupational History  . 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 Financial resource strain: Not on file  . Food insecurity:    Worry: Not on file    Inability: Not on file  . Transportation needs:    Medical: Not on file    Non-medical: Not on file  Tobacco Use  . Smoking status: Never Smoker  . Smokeless tobacco: Never Used  Substance and Sexual Activity  . Alcohol use: Yes    Alcohol/week: 0.0 standard drinks    Comment: very rarely  . Drug use: No  . Sexual activity: Yes    Birth control/protection: I.U.D.  Lifestyle  . Physical activity:    Days per week: Not on file    Minutes per session: Not on file  . Stress: Not on file  Relationships  . Social connections:    Talks on phone: Not on file    Gets together: Not on file    Attends religious service: Not on file    Active member of club or organization: Not on file    Attends meetings of clubs or organizations: Not on file    Relationship status: Not on file  . Intimate partner violence:    Fear of current or ex partner: Not on file    Emotionally abused: Not on file    Physically abused: Not on file    Forced sexual  activity: Not on file  Other Topics Concern  . Not on file  Social History Narrative   Patient lives at home with her family.   Caffeine Use: rarely     Review of Systems  Constitutional: Negative.   HENT: Positive for congestion, rhinorrhea, sinus pain and sore throat.   Eyes: Negative.   Respiratory: Positive for cough and wheezing.   Cardiovascular: Negative.  Negative for chest pain.  Gastrointestinal: Negative.  Negative for diarrhea.  Endocrine: Negative.  Genitourinary: Negative.  Negative for dysuria.  Musculoskeletal: Negative.  Negative for neck pain.  Skin: Negative.   Allergic/Immunologic: Negative.   Neurological: Positive for headaches.  Hematological: Negative.   Psychiatric/Behavioral: Negative.   All other systems reviewed and are negative.      Objective:    Vitals:   02/05/18 1615  BP: 126/80  Pulse: 73  Resp: 16  Temp: 98.3 F (36.8 C)  TempSrc: Oral  Weight: 143 lb 2 oz (64.9 kg)  Height: 5' 2.5" (1.588 m)      Physical Exam Vitals signs and nursing note reviewed.  Constitutional:      General: She is not in acute distress.    Appearance: Normal appearance. She is well-developed. She is not ill-appearing, toxic-appearing or diaphoretic.  HENT:     Head: Normocephalic and atraumatic.     Right Ear: Hearing, tympanic membrane, ear canal and external ear normal.     Left Ear: Hearing, tympanic membrane, ear canal and external ear normal.     Nose: Mucosal edema, congestion and rhinorrhea present.     Right Sinus: No maxillary sinus tenderness or frontal sinus tenderness.     Left Sinus: No maxillary sinus tenderness or frontal sinus tenderness.     Mouth/Throat:     Mouth: Mucous membranes are moist. Mucous membranes are not pale.     Pharynx: Oropharynx is clear. Uvula midline. No oropharyngeal exudate or uvula swelling.     Tonsils: No tonsillar abscesses.  Eyes:     General: No scleral icterus.       Right eye: No discharge.         Left eye: No discharge.     Conjunctiva/sclera: Conjunctivae normal.     Pupils: Pupils are equal, round, and reactive to light.  Neck:     Musculoskeletal: Normal range of motion and neck supple.     Trachea: No tracheal deviation.  Cardiovascular:     Rate and Rhythm: Normal rate and regular rhythm.     Pulses: Normal pulses.     Heart sounds: Normal heart sounds. No murmur. No friction rub.  Pulmonary:     Effort: Pulmonary effort is normal. No respiratory distress.     Breath sounds: No stridor. Wheezing present. No rhonchi or rales.  Abdominal:     General: Bowel sounds are normal. There is no distension.     Palpations: Abdomen is soft.     Tenderness: There is no abdominal tenderness. There is no guarding.  Musculoskeletal: Normal range of motion.  Lymphadenopathy:     Cervical: No cervical adenopathy.  Skin:    General: Skin is warm and dry.     Capillary Refill: Capillary refill takes less than 2 seconds.     Coloration: Skin is not pale.     Findings: No rash.  Neurological:     General: No focal deficit present.     Mental Status: She is alert.     Motor: No abnormal muscle tone.     Coordination: Coordination normal.  Psychiatric:        Behavior: Behavior normal.           Assessment & Plan:      ICD-10-CM   1. Acute bronchitis, unspecified organism J20.9 predniSONE (DELTASONE) 20 MG tablet    benzonatate (TESSALON) 100 MG capsule    HYDROcodone-homatropine (HYCODAN) 5-1.5 MG/5ML syrup    Albuterol Sulfate (PROAIR RESPICLICK) 563 (90 Base) MCG/ACT AEPB    albuterol (PROVENTIL) (2.5 MG/3ML) 0.083%  nebulizer solution  2. Upper respiratory tract infection, unspecified type J06.9 Cetirizine HCl (ZYRTEC ALLERGY) 10 MG TBDP    mometasone (NASONEX) 50 MCG/ACT nasal spray    Initial exam, pt with severe difficulty with inspiratory effort, had frequent coughing lungs very congested and tight sounding she was given a breathing treatment and reexamined she had  improved inspiratory effort, actually had worsening wheeze but improved breath sounds throughout and no rales or rhonchi.  Currently feel that she does have a viral illness and no indication currently for antibiotics.  Supportive and symptomatic treatment for the URI symptoms, steroids cough medicines and nebulizers for bronchitis, follow-up if not improving    Delsa Grana, PA-C 02/05/18 4:41 PM

## 2018-02-05 NOTE — Patient Instructions (Signed)
Likely a viral illness - starting with URI and then causing acute bronchitis.  Take steroids continue your Mucinex and you can try the prescribed cough medicines.  Continue to support your body with plenty of fluids, Tylenol and ibuprofen for aches pains fever chills sweats, you can do antihistamines nasal sprays for nasal symptoms.  The steroid dose starting very high and will decrease you should be able to decrease how much to use the inhaler as the steroids begin to work.  If you have any worsening any shortness of breath not relieved by the inhaler you need to get seen immediately for evaluation.  Viral symptoms do last few days to several weeks and sometimes a cough can linger for even longer and it is very frustrating.  We want you to be feeling much better in the next 5 to 7 days.  If you are not improving follow-up if you continue to cough for extended period of time and it still fairly severe you need to get seen again for reevaluation or we need to get a chest x-ray.

## 2018-02-06 MED ORDER — ALBUTEROL SULFATE (2.5 MG/3ML) 0.083% IN NEBU
2.5000 mg | INHALATION_SOLUTION | Freq: Once | RESPIRATORY_TRACT | Status: AC
  Administered 2018-02-05: 2.5 mg via RESPIRATORY_TRACT

## 2018-02-09 ENCOUNTER — Telehealth (HOSPITAL_COMMUNITY): Payer: Self-pay | Admitting: *Deleted

## 2018-02-09 NOTE — Telephone Encounter (Signed)
Patient given detailed instructions per Myocardial Perfusion Study Information Sheet for the test on 02/12/18 at 2:00. Patient notified to arrive 15 minutes early and that it is imperative to arrive on time for appointment to keep from having the test rescheduled.  If you need to cancel or reschedule your appointment, please call the office within 24 hours of your appointment. . Patient verbalized understanding.Candice Estrada

## 2018-02-11 ENCOUNTER — Other Ambulatory Visit (HOSPITAL_COMMUNITY): Payer: Self-pay

## 2018-02-12 ENCOUNTER — Ambulatory Visit (HOSPITAL_BASED_OUTPATIENT_CLINIC_OR_DEPARTMENT_OTHER): Payer: BLUE CROSS/BLUE SHIELD

## 2018-02-12 ENCOUNTER — Ambulatory Visit (HOSPITAL_COMMUNITY): Payer: BLUE CROSS/BLUE SHIELD | Attending: Cardiovascular Disease

## 2018-02-12 ENCOUNTER — Encounter: Payer: Self-pay | Admitting: *Deleted

## 2018-02-12 ENCOUNTER — Telehealth: Payer: Self-pay | Admitting: *Deleted

## 2018-02-12 ENCOUNTER — Ambulatory Visit (INDEPENDENT_AMBULATORY_CARE_PROVIDER_SITE_OTHER): Payer: BLUE CROSS/BLUE SHIELD

## 2018-02-12 ENCOUNTER — Other Ambulatory Visit (HOSPITAL_COMMUNITY): Payer: Self-pay

## 2018-02-12 DIAGNOSIS — R002 Palpitations: Secondary | ICD-10-CM | POA: Diagnosis not present

## 2018-02-12 DIAGNOSIS — R079 Chest pain, unspecified: Secondary | ICD-10-CM

## 2018-02-12 NOTE — Telephone Encounter (Signed)
   [facility] Pre-operative Risk Assessment    Request for surgical clearance:  What type of surgery is being performed? LEFT ANKLE ARTHROSCOPY, DEBRIDEMENT LATERAL LIGAMENT RECONSTRUCTION POSSIBLE OSTEOCHONDRALTREATMENT 1. When is this surgery scheduled? 02/17/18   2. What type of clearance is required (medical clearance vs. Pharmacy clearance to hold med vs. Both)? MEDICAL  3. Are there any medications that need to be held prior to surgery and how long?NONE LISTED   4. Practice name and name of physician performing surgery? [facility] ; DR. Gerald Stabs ADAIR   5. What is your office phone number [phone]   7.   What is your office fax number [phone]  8.   Anesthesia type (None, local, MAC, general) ? CHOICE   Julaine Hua 02/12/2018, 1:20 PM  _________________________________________________________________   (provider comments below)

## 2018-02-12 NOTE — Progress Notes (Signed)
Patient ID: Candice Estrada, female   DOB: [birth year], 43 y.o.   MRN: [id number] Patient expressed concern about wearing ZIO patch 14 day holter monitor.  She is scheduled for surgery 02/17/18, and would not be able to wear it the full 14 days.  Switched monitor type to the Preventice patch 14 day holter. The Preventice patch holter offers multiple strips, so patient will be able to wear up to surgery date and reapply after surgery.

## 2018-02-13 ENCOUNTER — Telehealth: Payer: Self-pay | Admitting: *Deleted

## 2018-02-13 NOTE — Telephone Encounter (Signed)
LMOM for the surgery scheduler named Elmyra Ricks at [facility] about pending status of clearance. The number listed on clearance is wrong instead it is [phone].

## 2018-02-13 NOTE — Telephone Encounter (Signed)
   Primary Cardiologist: Sherren Mocha, MD  Chart reviewed as part of pre-operative protocol coverage. Pt recently seen in clinic and endorsed atypical CP and palpitations. Stress echo and cardiac monitor pending. Clearance TBD based on study results.   Clearance pending.   Will have preop callback pool notify requesting MD of current pending status.    Lyda Jester, PA-C 02/13/2018, 12:09 PM

## 2018-02-13 NOTE — Telephone Encounter (Signed)
Candice Estrada from [facility] returned my call and I informed her of the pending clearance. Candice Estrada thanked me for the information because the pt told her that she was cleared for surgery.

## 2018-02-13 NOTE — Telephone Encounter (Signed)
-----   Message from Daune Perch, NP sent at 02/13/2018  4:22 PM EST ----- The stress echo normal with no ischemia on EKG and no echocardiographic evidence of stress induced ischemia. However, there was hypotension with exercise. BP lowered as HR increased which is abnormal. I will route this to Dr. Burt Knack and Lisbeth Renshaw since she knows the patient for further review. (Dayna will be back on the 5th).  The patient may benefit from a cardiac CTA to evaluate coronary anatomy.   Please send copy to [facility], Modena Nunnery, MD  Daune Perch, NP

## 2018-02-13 NOTE — Telephone Encounter (Signed)
Patient notified of result.  Please refer to phone note from today for complete details.   Candice Estrada, [facility] 02/13/2018 4:47 PM  Pt called back and has been notified of Stress Echo results.   I advised pt that Daune Perch, NP does advise that we will have Dr. Burt Knack and Melina Copa, PA review further as they are familiar with her history. I did go over the possible recommendation of doing a Cardiac CTA to review the coronary anatomy further. I explained that we will call back once Dr. Burt Knack and Melina Copa, PA review further. Pt thanked me for the call.

## 2018-02-16 NOTE — Telephone Encounter (Signed)
   Primary Cardiologist: Sherren Mocha, MD  Chart reviewed as part of pre-operative protocol coverage. Given past medical history and time since last visit, based on ACC/AHA guidelines, Candice Estrada would be at acceptable risk for the planned procedure without further cardiovascular testing. She had normal stress echo. She tends to run a low BP and will need monitoring and IV fluids as needed.   I will route this recommendation to the requesting party via Epic fax function and remove from pre-op pool.  Please call with questions.  Daune Perch, NP 02/16/2018, 5:51 AM

## 2018-02-16 NOTE — Telephone Encounter (Signed)
Called Dr Pollie Friar office and they have not received cardiac clearance. I have printed the clearance and refaxed it to their office.

## 2018-02-17 ENCOUNTER — Encounter (HOSPITAL_BASED_OUTPATIENT_CLINIC_OR_DEPARTMENT_OTHER): Payer: Self-pay | Admitting: *Deleted

## 2018-02-17 ENCOUNTER — Other Ambulatory Visit: Payer: Self-pay

## 2018-02-17 ENCOUNTER — Other Ambulatory Visit: Payer: Self-pay | Admitting: Orthopaedic Surgery

## 2018-02-17 ENCOUNTER — Telehealth: Payer: Self-pay | Admitting: Internal Medicine

## 2018-02-17 NOTE — Telephone Encounter (Signed)
Pt calling today because she is concerned with her chronically low BP and the safety of anesthesia. I advised her she was cleared for surgery and her surgeon/anesthesiologist was advised to monitor her BP during the procedure. I advised her that anesthesia providers would monitor her closely and give her fluids and/or medications to control her BP during her procedure.   Pt has verbalized understanding and had no additional questions.

## 2018-02-17 NOTE — Progress Notes (Signed)
Patient chart, ECHO and stress test results reviewed with Dr Daiva Huge. Patient also recently 02-05-18, was treated for acute bronchitis with steroids and inhaler. Cough better per pt but still has. Dr Daiva Huge states anesthesia will evaluate her DOS.

## 2018-02-17 NOTE — Telephone Encounter (Signed)
  Patient is calling because she was told by this office to monitor her blood pressure before she had her ankle surgery. Someone from our office called and told her that it was okay for her to have the surgery. Her concern is that her bp has been running around 90/50 and she wants to make sure that with her BP running low that it is okay for her to have the surgery.

## 2018-02-19 ENCOUNTER — Ambulatory Visit (HOSPITAL_BASED_OUTPATIENT_CLINIC_OR_DEPARTMENT_OTHER): Admitting: Anesthesiology

## 2018-02-19 ENCOUNTER — Encounter (HOSPITAL_BASED_OUTPATIENT_CLINIC_OR_DEPARTMENT_OTHER)
Admission: RE | Disposition: A | Discharge status: Home / Self Care | Payer: Self-pay | Source: Home / Self Care | Attending: Orthopaedic Surgery

## 2018-02-19 ENCOUNTER — Ambulatory Visit (HOSPITAL_BASED_OUTPATIENT_CLINIC_OR_DEPARTMENT_OTHER)
Admission: RE | Admit: 2018-02-19 | Discharge: 2018-02-19 | Disposition: A | Discharge status: Home / Self Care | Attending: Orthopaedic Surgery | Admitting: Orthopaedic Surgery

## 2018-02-19 ENCOUNTER — Encounter (HOSPITAL_BASED_OUTPATIENT_CLINIC_OR_DEPARTMENT_OTHER): Payer: Self-pay | Admitting: *Deleted

## 2018-02-19 ENCOUNTER — Other Ambulatory Visit: Payer: Self-pay

## 2018-02-19 DIAGNOSIS — F419 Anxiety disorder, unspecified: Secondary | ICD-10-CM | POA: Diagnosis not present

## 2018-02-19 DIAGNOSIS — M899 Disorder of bone, unspecified: Secondary | ICD-10-CM | POA: Insufficient documentation

## 2018-02-19 DIAGNOSIS — S93432A Sprain of tibiofibular ligament of left ankle, initial encounter: Secondary | ICD-10-CM | POA: Insufficient documentation

## 2018-02-19 DIAGNOSIS — Z8774 Personal history of (corrected) congenital malformations of heart and circulatory system: Secondary | ICD-10-CM | POA: Insufficient documentation

## 2018-02-19 DIAGNOSIS — I4891 Unspecified atrial fibrillation: Secondary | ICD-10-CM | POA: Insufficient documentation

## 2018-02-19 DIAGNOSIS — M25372 Other instability, left ankle: Secondary | ICD-10-CM | POA: Insufficient documentation

## 2018-02-19 DIAGNOSIS — M65872 Other synovitis and tenosynovitis, left ankle and foot: Secondary | ICD-10-CM | POA: Diagnosis not present

## 2018-02-19 DIAGNOSIS — X58XXXA Exposure to other specified factors, initial encounter: Secondary | ICD-10-CM | POA: Insufficient documentation

## 2018-02-19 DIAGNOSIS — S93422A Sprain of deltoid ligament of left ankle, initial encounter: Secondary | ICD-10-CM | POA: Insufficient documentation

## 2018-02-19 DIAGNOSIS — M25872 Other specified joint disorders, left ankle and foot: Secondary | ICD-10-CM | POA: Diagnosis present

## 2018-02-19 DIAGNOSIS — Z8673 Personal history of transient ischemic attack (TIA), and cerebral infarction without residual deficits: Secondary | ICD-10-CM | POA: Insufficient documentation

## 2018-02-19 DIAGNOSIS — Z79899 Other long term (current) drug therapy: Secondary | ICD-10-CM | POA: Insufficient documentation

## 2018-02-19 HISTORY — PX: ANKLE ARTHROSCOPY WITH RECONSTRUCTION: SHX5583

## 2018-02-19 LAB — POCT PREGNANCY, URINE: Preg Test, Ur: NEGATIVE

## 2018-02-19 SURGERY — ARTHROSCOPY, ANKLE, WITH RECONSTRUCTION
Anesthesia: General | Site: Ankle | Laterality: Left

## 2018-02-19 MED ORDER — BUPIVACAINE HCL 0.5 % IJ SOLN
INTRAMUSCULAR | Status: DC | PRN
  Administered 2018-02-19: 30 mL

## 2018-02-19 MED ORDER — PROPOFOL 10 MG/ML IV BOLUS
INTRAVENOUS | Status: DC | PRN
  Administered 2018-02-19: 150 mg via INTRAVENOUS

## 2018-02-19 MED ORDER — PHENYLEPHRINE HCL 10 MG/ML IJ SOLN
INTRAMUSCULAR | Status: DC | PRN
  Administered 2018-02-19: 120 ug via INTRAVENOUS

## 2018-02-19 MED ORDER — FENTANYL CITRATE (PF) 100 MCG/2ML IJ SOLN
INTRAMUSCULAR | Status: AC
  Filled 2018-02-19: qty 2

## 2018-02-19 MED ORDER — MIDAZOLAM HCL 2 MG/2ML IJ SOLN
1.0000 mg | INTRAMUSCULAR | Status: DC | PRN
  Administered 2018-02-19: 2 mg via INTRAVENOUS

## 2018-02-19 MED ORDER — ONDANSETRON HCL 4 MG/2ML IJ SOLN
INTRAMUSCULAR | Status: DC | PRN
  Administered 2018-02-19: 4 mg via INTRAVENOUS

## 2018-02-19 MED ORDER — OXYCODONE HCL 5 MG PO TABS: 5.0000 mg | ORAL_TABLET | ORAL | 0 refills | Status: AC | PRN

## 2018-02-19 MED ORDER — MIDAZOLAM HCL 2 MG/2ML IJ SOLN
INTRAMUSCULAR | Status: AC
  Filled 2018-02-19: qty 2

## 2018-02-19 MED ORDER — METHOCARBAMOL 500 MG PO TABS: 500.0000 mg | ORAL_TABLET | Freq: Four times a day (QID) | ORAL | 2 refills | Status: DC

## 2018-02-19 MED ORDER — FENTANYL CITRATE (PF) 100 MCG/2ML IJ SOLN
25.0000 ug | INTRAMUSCULAR | Status: DC | PRN
  Administered 2018-02-19 (×2): 50 ug via INTRAVENOUS

## 2018-02-19 MED ORDER — KETOROLAC TROMETHAMINE 30 MG/ML IJ SOLN
INTRAMUSCULAR | Status: DC | PRN
  Administered 2018-02-19: 30 mg via INTRAVENOUS

## 2018-02-19 MED ORDER — LIDOCAINE 2% (20 MG/ML) 5 ML SYRINGE
INTRAMUSCULAR | Status: DC | PRN
  Administered 2018-02-19: 60 mg via INTRAVENOUS

## 2018-02-19 MED ORDER — DEXAMETHASONE SODIUM PHOSPHATE 4 MG/ML IJ SOLN
INTRAMUSCULAR | Status: DC | PRN
  Administered 2018-02-19: 10 mg via INTRAVENOUS

## 2018-02-19 MED ORDER — LIDOCAINE 2% (20 MG/ML) 5 ML SYRINGE
INTRAMUSCULAR | Status: AC
  Filled 2018-02-19: qty 5

## 2018-02-19 MED ORDER — LACTATED RINGERS IV SOLN
INTRAVENOUS | Status: DC
  Administered 2018-02-19: 08:00:00 via INTRAVENOUS

## 2018-02-19 MED ORDER — CEFAZOLIN SODIUM-DEXTROSE 2-4 GM/100ML-% IV SOLN
2.0000 g | INTRAVENOUS | Status: AC
  Administered 2018-02-19: 2 g via INTRAVENOUS

## 2018-02-19 MED ORDER — ACETAMINOPHEN 500 MG PO TABS: 1000.0000 mg | ORAL_TABLET | Freq: Once | ORAL | Status: DC

## 2018-02-19 MED ORDER — OXYCODONE HCL 5 MG PO TABS
5.0000 mg | ORAL_TABLET | Freq: Once | ORAL | Status: AC
  Administered 2018-02-19: 5 mg via ORAL

## 2018-02-19 MED ORDER — CEFAZOLIN SODIUM-DEXTROSE 2-4 GM/100ML-% IV SOLN
INTRAVENOUS | Status: AC
  Filled 2018-02-19: qty 100

## 2018-02-19 MED ORDER — KETOROLAC TROMETHAMINE 30 MG/ML IJ SOLN
INTRAMUSCULAR | Status: AC
  Filled 2018-02-19: qty 1

## 2018-02-19 MED ORDER — OXYCODONE HCL 5 MG PO TABS
ORAL_TABLET | ORAL | Status: AC
  Filled 2018-02-19: qty 1

## 2018-02-19 MED ORDER — CHLORHEXIDINE GLUCONATE 4 % EX LIQD: 60.0000 mL | Freq: Once | CUTANEOUS | Status: DC

## 2018-02-19 MED ORDER — DEXAMETHASONE SODIUM PHOSPHATE 10 MG/ML IJ SOLN
INTRAMUSCULAR | Status: AC
  Filled 2018-02-19: qty 1

## 2018-02-19 MED ORDER — ONDANSETRON HCL 4 MG/2ML IJ SOLN
INTRAMUSCULAR | Status: AC
  Filled 2018-02-19: qty 2

## 2018-02-19 MED ORDER — 0.9 % SODIUM CHLORIDE (POUR BTL) OPTIME
TOPICAL | Status: DC | PRN
  Administered 2018-02-19: 200 mL

## 2018-02-19 MED ORDER — FENTANYL CITRATE (PF) 100 MCG/2ML IJ SOLN
50.0000 ug | INTRAMUSCULAR | Status: AC | PRN
  Administered 2018-02-19: 100 ug via INTRAVENOUS
  Administered 2018-02-19 (×2): 25 ug via INTRAVENOUS
  Administered 2018-02-19: 50 ug via INTRAVENOUS

## 2018-02-19 MED ORDER — SCOPOLAMINE 1 MG/3DAYS TD PT72: 1.0000 | MEDICATED_PATCH | Freq: Once | TRANSDERMAL | Status: DC | PRN

## 2018-02-19 SURGICAL SUPPLY — 63 items
BANDAGE ACE 4X5 VEL STRL LF (GAUZE/BANDAGES/DRESSINGS) ×2 IMPLANT
BANDAGE ESMARK 6X9 LF (GAUZE/BANDAGES/DRESSINGS) ×1 IMPLANT
BENZOIN TINCTURE PRP APPL 2/3 (GAUZE/BANDAGES/DRESSINGS) IMPLANT
BLADE CUDA GRT WHITE 3.5 (BLADE) IMPLANT
BLADE SURG 15 STRL LF DISP TIS (BLADE) ×5 IMPLANT
BLADE SURG 15 STRL SS (BLADE) ×5
BNDG ESMARK 4X9 LF (GAUZE/BANDAGES/DRESSINGS) IMPLANT
BNDG ESMARK 6X9 LF (GAUZE/BANDAGES/DRESSINGS) ×2
BUR CUDA 2.9 (BURR) IMPLANT
BUR GATOR 2.9 (BURR) IMPLANT
CHLORAPREP W/TINT 26ML (MISCELLANEOUS) ×2 IMPLANT
COVER WAND RF STERILE (DRAPES) IMPLANT
CUFF TOURNIQUET SINGLE 34IN LL (TOURNIQUET CUFF) ×2 IMPLANT
DECANTER SPIKE VIAL GLASS SM (MISCELLANEOUS) IMPLANT
DRAPE ARTHROSCOPY W/POUCH 90 (DRAPES) ×2 IMPLANT
DRAPE OEC MINIVIEW 54X84 (DRAPES) ×2 IMPLANT
DRAPE U-SHAPE 47X51 STRL (DRAPES) ×2 IMPLANT
ELECT REM PT RETURN 9FT ADLT (ELECTROSURGICAL) ×2
ELECTRODE REM PT RTRN 9FT ADLT (ELECTROSURGICAL) ×1 IMPLANT
GAUZE 4X4 16PLY RFD (DISPOSABLE) ×2 IMPLANT
GAUZE SPONGE 4X4 12PLY STRL (GAUZE/BANDAGES/DRESSINGS) ×2 IMPLANT
GAUZE XEROFORM 1X8 LF (GAUZE/BANDAGES/DRESSINGS) ×2 IMPLANT
GLOVE BIO SURGEON STRL SZ7 (GLOVE) ×2 IMPLANT
GLOVE BIO SURGEON STRL SZ7.5 (GLOVE) ×2 IMPLANT
GLOVE BIOGEL PI IND STRL 7.0 (GLOVE) ×1 IMPLANT
GLOVE BIOGEL PI IND STRL 7.5 (GLOVE) ×1 IMPLANT
GLOVE BIOGEL PI IND STRL 8 (GLOVE) ×1 IMPLANT
GLOVE BIOGEL PI INDICATOR 7.0 (GLOVE) ×1
GLOVE BIOGEL PI INDICATOR 7.5 (GLOVE) ×1
GLOVE BIOGEL PI INDICATOR 8 (GLOVE) ×1
GOWN STRL REUS W/ TWL LRG LVL3 (GOWN DISPOSABLE) ×1 IMPLANT
GOWN STRL REUS W/ TWL XL LVL3 (GOWN DISPOSABLE) ×1 IMPLANT
GOWN STRL REUS W/TWL LRG LVL3 (GOWN DISPOSABLE) ×1
GOWN STRL REUS W/TWL XL LVL3 (GOWN DISPOSABLE) ×1
KIT SUTURETAK 2.4 DRILL BIT (KITS) ×2 IMPLANT
NEEDLE KEITH (NEEDLE) ×2 IMPLANT
NS IRRIG 1000ML POUR BTL (IV SOLUTION) IMPLANT
PACK ARTHROSCOPY DSU (CUSTOM PROCEDURE TRAY) ×2 IMPLANT
PACK BASIN DAY SURGERY FS (CUSTOM PROCEDURE TRAY) ×2 IMPLANT
PADDING CAST SYNTHETIC 4 (CAST SUPPLIES) ×1
PADDING CAST SYNTHETIC 4X4 STR (CAST SUPPLIES) ×1 IMPLANT
PENCIL BUTTON HOLSTER BLD 10FT (ELECTRODE) ×2 IMPLANT
SLEEVE SCD COMPRESS KNEE MED (MISCELLANEOUS) ×2 IMPLANT
SPLINT FIBERGLASS 4X30 (CAST SUPPLIES) IMPLANT
SPONGE LAP 18X18 RF (DISPOSABLE) ×2 IMPLANT
STOCKINETTE 6  STRL (DRAPES) ×1
STOCKINETTE 6 STRL (DRAPES) ×1 IMPLANT
STRAP ANKLE FOOT DISTRACTOR (ORTHOPEDIC SUPPLIES) ×2 IMPLANT
STRIP CLOSURE SKIN 1/2X4 (GAUZE/BANDAGES/DRESSINGS) IMPLANT
SUCTION FRAZIER HANDLE 10FR (MISCELLANEOUS) ×1
SUCTION TUBE FRAZIER 10FR DISP (MISCELLANEOUS) ×1 IMPLANT
SUT BONE WAX W31G (SUTURE) ×2 IMPLANT
SUT ETHILON 3 0 PS 1 (SUTURE) ×2 IMPLANT
SUT MNCRL AB 3-0 PS2 18 (SUTURE) ×2 IMPLANT
SUT PDS AB 2-0 CT2 27 (SUTURE) ×2 IMPLANT
SUT VIC AB 0 SH 27 (SUTURE) ×2 IMPLANT
SUT VIC AB 3-0 FS2 27 (SUTURE) ×2 IMPLANT
SYR BULB 3OZ (MISCELLANEOUS) ×2 IMPLANT
Shaver ×2 IMPLANT
Suture Anchor ×2 IMPLANT
TOWEL GREEN STERILE FF (TOWEL DISPOSABLE) ×4 IMPLANT
TUBING ARTHROSCOPY IRRIG 16FT (MISCELLANEOUS) ×2 IMPLANT
UNDERPAD 30X30 (UNDERPADS AND DIAPERS) ×2 IMPLANT

## 2018-02-19 NOTE — Transfer of Care (Signed)
Immediate Anesthesia Transfer of Care Note  Patient: Candice Estrada  Procedure(s) Performed: LEFT ANKLE ARTHROSCOPIC DEBRIDEMENT, LATERAL LIGAMENT RECONSTRUCTION, POSSIBLE OSTEOCHONDRAL TREATMENT (Left Ankle)  Patient Location: PACU  Anesthesia Type:General  Level of Consciousness: awake and sedated  Airway & Oxygen Therapy: Patient Spontanous Breathing and Patient connected to face mask oxygen  Post-op Assessment: Report given to RN and Post -op Vital signs reviewed and stable  Post vital signs: Reviewed and stable  Last Vitals:  Vitals Value Taken Time  BP 98/65 02/19/2018 11:22 AM  Temp    Pulse 89 02/19/2018 11:24 AM  Resp 10 02/19/2018 11:24 AM  SpO2 99 % 02/19/2018 11:24 AM  Vitals shown include unvalidated device data.  Last Pain:  Vitals:   02/19/18 0749  TempSrc: Oral  PainSc: 3       Patients Stated Pain Goal: 3 (93/11/21 6244)  Complications: No apparent anesthesia complications

## 2018-02-19 NOTE — Anesthesia Postprocedure Evaluation (Signed)
Anesthesia Post Note  Patient: Candice Estrada  Procedure(s) Performed: LEFT ANKLE ARTHROSCOPIC DEBRIDEMENT, LATERAL LIGAMENT RECONSTRUCTION, POSSIBLE OSTEOCHONDRAL TREATMENT (Left Ankle)     Patient location during evaluation: PACU Anesthesia Type: General Level of consciousness: awake and alert Pain management: pain level controlled Vital Signs Assessment: post-procedure vital signs reviewed and stable Respiratory status: spontaneous breathing, nonlabored ventilation, respiratory function stable and patient connected to nasal cannula oxygen Cardiovascular status: blood pressure returned to baseline and stable Postop Assessment: no apparent nausea or vomiting Anesthetic complications: no    Last Vitals:  Vitals:   02/19/18 1230 02/19/18 1308  BP:  129/88  Pulse: 83 88  Resp: (!) 9 18  Temp:  36.4 C  SpO2: 99% 96%    Last Pain:  Vitals:   02/19/18 1308  TempSrc:   PainSc: 5                  Zayla Agar L [name]

## 2018-02-19 NOTE — H&P (Signed)
Candice Estrada is an 43 y.o. female.   Chief Complaint: Left ankle and foot pain HPI: Patient had a twisting injury to her ankle several months ago.  This has turned into chronic pain in her left lateral ankle and foot.  She had a round of boot immobilization, physical therapy, anti-inflammatory use, activity modification but continues have pain in the anterolateral ankle and foot.  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  There is bony edema there.  She was sent to my office where CT scan was performed and showed that the coalition was not a bony coalition.  There were some changes within the enterprise the calcaneus and the lateral navicular.  She is here today for arthroscopic ankle debridement with hospital lateral ligament reconstruction and coalition excision.  She continues to have the same pain.  It is limiting her activities.  Past Medical History:  Diagnosis Date  . Anxiety   . ASD (atrial septal defect), ostium secundum    Status post closure 08/20/10 with Dr. Burt Knack; procedure complicated by right groin hematoma and acute blood loss anemia  . BV (bacterial vaginosis) 10/06/2014  . Complication of anesthesia    VASO-VAGAL RESPONSE IN RECOVERY ROOM AFTER HEART SURGERY  . Dysrhythmia    A-FIB  . Hematuria   . History of psoriasis    scalp  . History of TIAs   . Hx of dizziness    random episodes "because my BP is so low"  . Hypotension   . IUD (intrauterine device) in place 12/16/2012  . Kidney stone   . Meniere's disease   . Night terrors   . NSVT (nonsustained ventricular tachycardia) (HCC)    a. 4 beat run on Zio 2015.  Marland Kitchen Ocular neoplasm   . Ovarian cyst   . POTS (postural orthostatic tachycardia syndrome)   . SBO (small bowel obstruction) ([place])   . 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 Thyroid enlarged 12/16/2012   History of cyst will get [place]    Past Surgical History:  Procedure Laterality  Date  . ASD REPAIR  08/20/2010  . CARDIAC SURGERY    . CYSTOSCOPY WITH RETROGRADE PYELOGRAM, URETEROSCOPY AND STENT PLACEMENT Left 08/31/2014   Procedure: CYSTOSCOPY WITH BILATERAL RETROGRADE PYELOGRAM, URETEROSCOPY AND STENT PLACEMENT, STONE EXTRACTION WITH BASKET;  Surgeon: Cleon Gustin, MD;  Location: [facility];  Service: Urology;  Laterality: Left;  . FOOT NEUROMA SURGERY  2009   left  . kidney stone removed    . THYROID CYST EXCISION    . TONSILLECTOMY      Family History  Problem Relation Age of Onset  . Hypertension Father 33  . Stroke Father   . Heart attack Father   . Brain cancer Father   . Heart disease Father   . Diabetes Father   . Liver disease Mother 77       dead  . Cancer Mother        breast  . Hypertension Brother 36  . Cancer Paternal Grandmother        breast  . Dementia Maternal Grandmother   . Cancer Maternal Grandmother        thyroid  . Cancer Paternal Aunt        breast   . Kidney failure Daughter   . Cancer Other        mom's grandma-breast   Social History:  reports that she has never smoked.  She has never used smokeless tobacco. She reports current alcohol use. She reports that she does not use drugs.  Allergies: No Known Allergies  Medications Prior to Admission  Medication Sig Dispense Refill  . buPROPion (WELLBUTRIN SR) 150 MG 12 hr tablet TAKE 1 TABLET DAILY 90 tablet 4  . ketorolac (ACULAR) 0.5 % ophthalmic solution 1 drop 4 (four) times daily.    . Melatonin 3 MG TABS Take 1 tablet by mouth daily.    . Multiple Vitamin (MULTIVITAMIN WITH MINERALS) TABS Take 1 tablet by mouth every morning.     Marland Kitchen PARAGARD INTRAUTERINE COPPER IU by Intrauterine route.    Marland Kitchen ibuprofen (ADVIL,MOTRIN) 800 MG tablet Take 1 tablet (800 mg total) by mouth every 8 (eight) hours as needed. 90 tablet 4    Results for orders placed or performed during the hospital encounter of 02/19/18 (from the past 48 hour(s))  Pregnancy, urine POC     Status: None    Collection Time: 02/19/18  7:39 AM  Result Value Ref Range   Preg Test, Ur NEGATIVE NEGATIVE    Comment:        THE SENSITIVITY OF THIS METHODOLOGY IS >24 mIU/mL    No results found.  Review of Systems  Constitutional: Negative.   HENT: Negative.   Eyes: Negative.   Respiratory: Negative.   Cardiovascular: Negative.   Gastrointestinal: Negative.   Musculoskeletal:       Left foot and ankle pain  Skin: Negative.   Neurological: Negative.   Psychiatric/Behavioral: Negative.     Blood pressure 125/70, pulse 94, temperature 98 F (36.7 C), temperature source Oral, resp. rate 18, height 5' 2.5" (1.588 m), weight 65.6 kg, last menstrual period 02/13/2018, SpO2 100 %. Physical Exam  Constitutional: She appears well-developed.  HENT:  Head: Normocephalic.  Eyes: Pupils are equal, round, and reactive to light.  Neck: Neck supple.  Cardiovascular: Normal rate.  Respiratory: Effort normal.  GI: Soft.  Musculoskeletal:     Comments: Evaluation the left foot demonstrates tenderness palpation anterolateral ankle.  She has tenderness over a bony prominence noted.  She has mild discomfort in the anterior ankle.  She has mild discomfort with passive ankle motion.  She has stiffness with hindfoot motion and discomfort.  No swelling.  No ecchymosis.  Motor and sensation intact distally.  Foot is warm and well-perfused.  Neurological: She is alert.  Skin: Skin is warm.  Psychiatric: She has a normal mood and affect.     Assessment/Plan We will proceed with planned procedure.  Arthroscopic ankle debridement, possible lateral ligament reconstruction and coalition excision.  We will do an exam under anesthesia to assess for ankle stability given her guarding.  She understands the risk, benefits and alternatives to surgery which include but were not limited to wound healing complications, infection, continued pain, persistent swelling, need for further surgery, damage surrounding structures.  We  also discussed the perioperative and anesthetic risk which include death.  She understands these risks and wished to proceed.  She understands the postoperative weightbearing restrictions.  She agrees to comply.  Erle Crocker, MD 02/19/2018, 8:51 AM

## 2018-02-19 NOTE — Anesthesia Preprocedure Evaluation (Addendum)
Anesthesia Evaluation  Patient identified by MRN, date of birth, ID band Patient awake    Reviewed: Allergy & Precautions, NPO status , Patient's Chart, lab work & pertinent test results  Airway Mallampati: I  TM Distance: >3 FB Neck ROM: Full    Dental no notable dental hx. (+) Teeth Intact, Dental Advisory Given   Pulmonary neg pulmonary ROS,    Pulmonary exam normal breath sounds clear to auscultation       Cardiovascular Normal cardiovascular exam+ dysrhythmias (pAfib not on anticoagulation) Atrial Fibrillation  Rhythm:Regular Rate:Normal  ASD s/p closure 2012  TTE 01/2018 - Left ventricle: The cavity size was normal. Systolic function was normal. The estimated ejection fraction was in the range of 60% to 65%. Wall motion was normal; there were no regional wall  motion abnormalities. Left ventricular diastolic function parameters were normal. - Aortic valve: Transvalvular velocity was within the normal range. There was no stenosis. There was no regurgitation. - Mitral valve: Transvalvular velocity was within the normal range.There was no evidence for stenosis. There was trivial regurgitation. - Right ventricle: The cavity size was normal. Wall thickness was normal. Systolic function was normal. - Atrial septum: No defect or patent foramen ovale was identified by color flow Doppler. - Tricuspid valve: There was mild regurgitation. - Pulmonary arteries: Systolic pressure was within the normal range.  Stress Test 01/2018 - Normal stress echo   Patient had hypotensive respnse to exercise with systolic at rest 680 mmHg dropping to 70mHg at peak stress when study stopped Patient had nasuea with dizziness   Neuro/Psych PSYCHIATRIC DISORDERS Anxiety TIA   GI/Hepatic negative GI ROS, Neg liver ROS,   Endo/Other  negative endocrine ROS  Renal/GU negative Renal ROS  negative genitourinary   Musculoskeletal negative musculoskeletal  ROS (+)   Abdominal   Peds  Hematology negative hematology ROS (+)   Anesthesia Other Findings   Reproductive/Obstetrics negative OB ROS                            Anesthesia Physical Anesthesia Plan  ASA: III  Anesthesia Plan: General   Post-op Pain Management:    Induction: Intravenous  PONV Risk Score and Plan: 3 and Midazolam, Ondansetron and Dexamethasone  Airway Management Planned: LMA  Additional Equipment:   Intra-op Plan:   Post-operative Plan:   Informed Consent: I have reviewed the patients History and Physical, chart, labs and discussed the procedure including the risks, benefits and alternatives for the proposed anesthesia with the patient or authorized representative who has indicated his/her understanding and acceptance.     Dental advisory given  Plan Discussed with: CRNA  Anesthesia Plan Comments:        Anesthesia Quick Evaluation

## 2018-02-19 NOTE — Anesthesia Procedure Notes (Signed)
Procedure Name: LMA Insertion Performed by: Terrance Mass, CRNA Pre-anesthesia Checklist: Patient identified, Emergency Drugs available, Suction available and Patient being monitored Patient Re-evaluated:Patient Re-evaluated prior to induction Oxygen Delivery Method: Circle system utilized Preoxygenation: Pre-oxygenation with 100% oxygen Induction Type: IV induction Ventilation: Mask ventilation without difficulty LMA: LMA inserted LMA Size: 4.0 Number of attempts: 1 Placement Confirmation: positive ETCO2 Tube secured with: Tape Dental Injury: Teeth and Oropharynx as per pre-operative assessment

## 2018-02-19 NOTE — Op Note (Signed)
Candice Estrada female 43 y.o. 02/19/2018  PreOperative Diagnosis: Left calcaneonavicular coalition Ankle impingement Ankle instability   PostOperative Diagnosis: Left calcaneonavicular coalition Left ankle impingement Left ankle instability Left anterolateral talar exostosis  PROCEDURE: Left calcaneonavicular coalition excision Left ankle arthroscopic debridement, extensive. Left ankle lateral ligament reconstruction Left ankle arthrotomy  Partial excision of talus  SURGEON: Radene Journey, MD  ASSISTANT: None  ANESTHESIA: LMA  FINDINGS: See postoperative diagnosis No significant chondrosis within the ankle joint  IMPLANTS: Arthrex suture tack  INDICATIONS:42 y.o. female has been dealing with chronic ankle pain for greater than 1 year.  She had an inversion injury and never fully recovered.  She had appropriate conservative treatment the form of boot immobilization, ASO brace usage, anti-inflammatories, physical therapy but continued to have pain in the anterolateral aspect of her ankle and dorsal lateral aspect of her foot.  It was sharp in quality.  MRI was performed by an outside physician and demonstrated a fibrous coalition between the navicular and the calcaneus.  There is significant bone edema surrounding this area.  This corresponded with her area of discomfort.  Also on exam she was found to have ankle instability and a palpable bony prominence of the anterior lateral aspect of the talar dome.  Given this she was indicated for the above procedures.  The risk, benefits and alternatives of the surgery were discussed with her.  Risks include but are not limited to wound healing complications, infection, continued pain, stiffness, need for further surgery, damage surrounding structures.  We also discussed the perioperative and anesthetic risk which include death.  After weighing these risks he opted proceed.  PROCEDURE: Patient was identified in the preoperative holding  area.  The left leg was marked by myself.  Consent was signed by myself and the patient.  Discussed with anesthesia by peripheral nerve block resulted in no peripheral nerve block per the patient.  She is taken the operative suite placed supine the operative table.  General LMA anesthesia was induced without difficulty.  A bump was placed under the left hip and left thigh tourniquet was placed.  All bony prominences well-padded.  Preoperative antibiotics were given.  The left lower extremity was prepped and draped in the usual sterile fashion.  Surgical timeout was performed.  The left leg was elevated and the thigh tourniquet was inflated to 250 mmHg.  Then the ankle distractor set was placed and confirmed to be well-padded.  We began by making an anteromedial portal to the ankle joint.  This was done sharply down through skin subcutaneous tissue with a 15 blade.  Then using a hemostat through blunt dissection the ankle joint was entered.  Then the camera was inserted there is found to be synovitis and scarring within the anterior ankle joint.  Then a anterolateral portal was placed in a similar fashion.  Then using a shaver the ankle joint was debrided extensively of synovitic tissue and scarring from her prior inversion injury.  The cartilage of the ankle joint was inspected and found to have no evidence of significant chondrosis.  The syndesmosis was stable on direct manipulation.  There is no synovitis within the syndesmotic ligament complex.  Attempt was made to identify the large bony prominence on the anterior lateral aspect of her talus and given the position of this it was unsuccessful.  We then turned our attention to the coalition.  The scope and shaver was removed.  The ankle distractor was removed.  An incision was made overlying the extensor  digitorum brevis muscle belly in line with the tip of the fibula and the fourth ray.  This taken sharply down through skin subcutaneous tissue.  There were no  branches of the superficial peroneal nerve were within the surgical field.  The extensor digitorum brevis muscle belly was identified and the fascia overlying this was incised.  The muscle belly was elevated off the underlying calcaneocuboid joint and anterior process of the calcaneus sharply with 15 blade.  Then fluoroscopy was used to identify the coalition site between the navicular and the calcaneus.  After this sharply the fibrous coalition was entered with a 15 blade.  Then using 1/2 inch osteotome the coalition was removed.  Complete coalition excision of the calcaneonavicular coalition was removed and confirmed on fluoroscopy.  Then using a 0 Vicryl stitch the proximal portion of the extensor digitorum brevis muscle belly and overlying fascia was captured and using a Keith needle the 2 ends of the suture were brought down through the coalition site out through the medial plantar portion of the foot.  Then a small incision was made on the plantar foot at the site of the suture and the extensor digitorum brevis muscle belly was sutured down into the coalition site to prevent recurrence or scarring.  We then extended the incision laterally up in line with the fibula.  This was taken sharply down through skin subcutaneous tissue and again no branch of the superficial peroneal nerve were identified.  Skin flaps were created anteriorly and posteriorly to identify the underlying fat pad.  This was incised sharply.  Then the anterolateral ankle joint capsule was identified and there was a large bony prominence of the antero-lateral portion of the talar dome that was palpable.  The ankle capsule was incised with a 15 blade and opened.  It was elevated in this area of the talus was inspected.  There was an extra-articular area of exostosis that was palpable and was pressing on the overlying soft tissues.  Using a rondure this was removed and smoothed down completing the partial excision of talus portion of the case.   The ankle was taken through range of motion there was no bony impingement anteriorly.  Then the tissue overlying the distal fibula and the lateral ligament complex was elevated.  The ATFL ligament was identified and found to be attenuated.  Then an elliptical incision was created around the tip of the fibula releasing the ATFL.  The distal end of the fibula was then roughened up with a rondure back to cancellus bone.  Then the Arthrex suture tack was placed in the standard fashion.  The ATFL tissue was then imbricated and while holding the ankle in a dorsiflexion in the everted position the tissue was tightened down.  This was oversewed with 0 FiberWire stitch.  After the reconstruction of the ligaments the ankle was stable to anterior drawer testing.  Then the wounds were all irrigated.  The deep tissue overlying the collection was closed with 2-0 PDS and the subcuticular tissue was closed with 3-0 Monocryl and the skin with 3-0 nylon.  Prior to skin closure the tourniquet was released and hemostasis was obtained with Bovie cautery and compression.  She was placed in a soft dressing of Xeroform, 4 x 4's, sterile sheet cotton and a short leg splint was placed.  She was awakened from anesthesia and taken to recovery in stable condition.  All counts were correct at the end the case.  There were no complications.  POST OPERATIVE  INSTRUCTIONS: Nonweightbearing to left lower extremity Keep splint dry Call office with concerns Follow-up in 2 weeks for splint removal, suture removal, x-rays of the left foot including a lateral oblique and cast placement No indication for DVT prophylaxis  TOURNIQUET TIME: 83 minutes  BLOOD LOSS:  Minimal         DRAINS: none         SPECIMEN: none       COMPLICATIONS:  * No complications entered in OR log *         Disposition: PACU - hemodynamically stable.         Condition: stable

## 2018-02-19 NOTE — Discharge Instructions (Signed)
Post Anesthesia Home Care Instructions  Activity: Get plenty of rest for the remainder of the day. A responsible individual must stay with you for 24 hours following the procedure.  For the next 24 hours, DO NOT: -Drive a car -Paediatric nurse -Drink alcoholic beverages -Take any medication unless instructed by your physician -Make any legal decisions or sign important papers.  Meals: Start with liquid foods such as gelatin or soup. Progress to regular foods as tolerated. Avoid greasy, spicy, heavy foods. If nausea and/or vomiting occur, drink only clear liquids until the nausea and/or vomiting subsides. Call your physician if vomiting continues.  Special Instructions/Symptoms: Your throat may feel dry or sore from the anesthesia or the breathing tube placed in your throat during surgery. If this causes discomfort, gargle with warm salt water. The discomfort should disappear within 24 hours.  If you had a scopolamine patch placed behind your ear for the management of post- operative nausea and/or vomiting:  1. The medication in the patch is effective for 72 hours, after which it should be removed.  Wrap patch in a tissue and discard in the trash. Wash hands thoroughly with soap and water. 2. You may remove the patch earlier than 72 hours if you experience unpleasant side effects which may include dry mouth, dizziness or visual disturbances. 3. Avoid touching the patch. Wash your hands with soap and water after contact with the patch.      *No Ibuprofen until after 3:30pm*  DR. [facility] POST-OP INSTRUCTIONS   Pain Management 1. The numbing medicine and your leg will last around 4 hours, take a dose of your pain medicine as soon as you feel it wearing off to avoid rebound pain. 2. Keep your foot elevated above heart level.  Make sure that your heel hangs free ('floats'). 3. Take all prescribed medication as directed. 4. If taking narcotic pain medication you may  want to use an over-the-counter stool softener to avoid constipation. 5. You may take over-the-counter NSAIDs (ibuprofen, naproxen, etc.) as well as over-the-counter acetaminophen as directed on the packaging as a supplement for your pain and may also use it to wean away from the prescription medication.  Activity ?  ? Non-weightbearing ? Postoperatively, you will be placed into a splint which stays on for 2 weeks and then will be changed at your first postop visit.  First Postoperative Visit 1. Your first postop visit will be at least 2 weeks after surgery.  This should be scheduled when you schedule surgery. 2. If you do not have a postoperative visit scheduled please call [phone] to schedule an appointment. 3. At the appointment your incision will be evaluated for suture removal, x-rays will be obtained if necessary.  General Instructions 1. Swelling is very common after foot and ankle surgery.  It often takes 3 months for the foot and ankle to begin to feel comfortable.  Some amount of swelling will persist for 6-12 months. 2. DO NOT change the dressing.  If there is a problem with the dressing (too tight, loose, gets wet, etc.) please contact Dr. Pollie Friar office. 3. DO NOT get the dressing wet.  For showers you can use an over-the-counter cast cover or wrap a washcloth around the top of your dressing and then cover it with a plastic bag and tape it to your leg. 4. DO NOT soak the incision (no tubs, pools, bath, etc.) until you have approval from Dr. Lucia Gaskins.  Contact Dr. Huel Cote office or go to  Emergency Room if: 1. Temperature above 101 F. 2. Increasing pain that is unresponsive to pain medication or elevation 3. Excessive redness or swelling in your foot 4. Dressing problems - excessive bloody drainage, looseness or tightness, or if dressing gets wet 5. Develop pain, swelling, warmth, or discoloration of your calf   Post Anesthesia Home Care Instructions  Activity: Get plenty  of rest for the remainder of the day. A responsible individual must stay with you for 24 hours following the procedure.  For the next 24 hours, DO NOT: -Drive a car -Paediatric nurse -Drink alcoholic beverages -Take any medication unless instructed by your physician -Make any legal decisions or sign important papers.  Meals: Start with liquid foods such as gelatin or soup. Progress to regular foods as tolerated. Avoid greasy, spicy, heavy foods. If nausea and/or vomiting occur, drink only clear liquids until the nausea and/or vomiting subsides. Call your physician if vomiting continues.  Special Instructions/Symptoms: Your throat may feel dry or sore from the anesthesia or the breathing tube placed in your throat during surgery. If this causes discomfort, gargle with warm salt water. The discomfort should disappear within 24 hours.  If you had a scopolamine patch placed behind your ear for the management of post- operative nausea and/or vomiting:  1. The medication in the patch is effective for 72 hours, after which it should be removed.  Wrap patch in a tissue and discard in the trash. Wash hands thoroughly with soap and water. 2. You may remove the patch earlier than 72 hours if you experience unpleasant side effects which may include dry mouth, dizziness or visual disturbances. 3. Avoid touching the patch. Wash your hands with soap and water after contact with the patch.       Regional Anesthesia Blocks  1. Numbness or the inability to move the "blocked" extremity may last from 3-48 hours after placement. The length of time depends on the medication injected and your individual response to the medication. If the numbness is not going away after 48 hours, call your surgeon.  2. The extremity that is blocked will need to be protected until the numbness is gone and the  Strength has returned. Because you cannot feel it, you will need to take extra care to avoid injury. Because it may be  weak, you may have difficulty moving it or using it. You may not know what position it is in without looking at it while the block is in effect.  3. For blocks in the legs and feet, returning to weight bearing and walking needs to be done carefully. You will need to wait until the numbness is entirely gone and the strength has returned. You should be able to move your leg and foot normally before you try and bear weight or walk. You will need someone to be with you when you first try to ensure you do not fall and possibly risk injury.  4. Bruising and tenderness at the needle site are common side effects and will resolve in a few days.  5. Persistent numbness or new problems with movement should be communicated to the surgeon or the [place] [phone] [place] [phone]).

## 2018-02-23 ENCOUNTER — Encounter (HOSPITAL_BASED_OUTPATIENT_CLINIC_OR_DEPARTMENT_OTHER): Payer: Self-pay | Admitting: Orthopaedic Surgery

## 2018-03-09 ENCOUNTER — Encounter: Payer: Self-pay | Admitting: Physician Assistant

## 2018-03-09 NOTE — Progress Notes (Addendum)
Cardiology Office Note    Date:  03/12/2018  ID:  Candice, Estrada 1975/01/26, MRN [id number] PCP:  Alycia Rossetti, MD  Cardiologist:  Sherren Mocha, MD, EP - Dr. Caryl Comes   Chief Complaint: f/u cardiac testing  History of Present Illness:  Candice Estrada is a 43 y.o. female with history of transcatheter ASD closure in 2012 after presenting with a TIA in the setting of a small ostium secundum ASD, POTS/NSVT (followed by Dr. Caryl Comes) with diagnosis complicated by semicircular canal dehiscence, migraines, B12 deficiency, ocular surface squamous neoplasia, SBO 06/2017 who presents to f/u testing.  She was remotely followed by Dr. Burt Knack for ASD closure but most of her care has been with Dr. Caryl Comes with unusual presentations of recurrent palpitations and near-syncope. Dr. 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 Back at time of cardiology eval prior to that diagnosis, Dr. Caryl Comes had suggested possible neurologic component. Regarding h/o palpitations and near syncope, Florinef, proamantine, and beta blocker have not been helpful. Her symptoms were previously aggravated by temperature changes and menses. Signal average EKG was normal. Zio patch in 2015 showed PACs and 4 beat run NSVT. Monitor in 12/2015 showed isolated PACs and one ventricular couplet noted; symptoms correlated with NSR though. 2D echo 07/2013 showed EF 55-60%, ASD closure appeared normal. MRI of the brain in 02/2015 was normal. 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 I met her in 01/2018 and she was recovering from her ocular issues. She had reported a prolonged episode of CP on NYE as well as intermittent palpitations. She had been exercising regularly on the elliptical. 2D echo 02/03/18 was reassuring with EF 60-65%, no abnormalities with ASD closure, +  mild TR. Stress echo 02/12/18 was normal except she did have hypotension at peak stress, 8:55 exercise time. I reviewed with Dr. Burt Knack and this was felt more of an autonomic issue than ischemia as EKG was normal and she had no anginal symptoms. Labs 01/2018 showed K 4.1, Cr 0.96, TSH/CBC/Mg normal. Event monitor has not formally resulted yet but appears to have shown rare PACs, PVCs, and 1 tachycardic run for 24 beats, possibly ventricular, although her morphology does not appear significantly wider than baseline tracing.  She returns for follow-up and is feeling stable from a cardiac standpoint. No exertional angina or dyspnea. No syncope. She does have general heat intolerance and feels near-syncopal if she gets overheated. She continues to have intermittent palpitations but states they've been with her for so long she's just gotten used to them so she doesn't always notice them anymore. She had ankle surgery and is followed by ortho for residual swelling which she states she's been told is expected given the type of surgery. She's in a cast.   Past Medical History:  Diagnosis Date  . Anxiety   . ASD (atrial septal defect), ostium secundum    Status post closure 08/20/10 with Dr. Burt Knack; procedure complicated by right groin hematoma and acute blood loss anemia  . BV (bacterial vaginosis) 10/06/2014  . Complication of anesthesia    VASO-VAGAL RESPONSE IN RECOVERY ROOM AFTER HEART SURGERY  . Hematuria   . History of psoriasis    scalp  . History of TIAs   . Hx of dizziness    random episodes "because my BP is so low"  .  Hypotension   . IUD (intrauterine device) in place 12/16/2012  . Kidney stone   . Meniere's disease   . Night terrors   . NSVT (nonsustained ventricular tachycardia) (HCC)    a. 4 beat run on Zio 2015.  Marland Kitchen Ocular neoplasm   . Ovarian cyst   . POTS (postural orthostatic tachycardia syndrome)   . SBO (small bowel obstruction) ([place])   . 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 Thyroid enlarged 12/16/2012   History of cyst will get [place]    Past Surgical History:  Procedure Laterality Date  . ANKLE ARTHROSCOPY WITH RECONSTRUCTION Left 02/19/2018   Procedure: LEFT ANKLE ARTHROSCOPIC DEBRIDEMENT, LATERAL LIGAMENT RECONSTRUCTION,  OSTEOCHONDRAL TREATMENT;  Surgeon: Erle Crocker, MD;  Location: [facility];  Service: Orthopedics;  Laterality: Left;  . ASD REPAIR  08/20/2010  . CARDIAC SURGERY    . CYSTOSCOPY WITH RETROGRADE PYELOGRAM, URETEROSCOPY AND STENT PLACEMENT Left 08/31/2014   Procedure: CYSTOSCOPY WITH BILATERAL RETROGRADE PYELOGRAM, URETEROSCOPY AND STENT PLACEMENT, STONE EXTRACTION WITH BASKET;  Surgeon: Cleon Gustin, MD;  Location: [facility];  Service: Urology;  Laterality: Left;  . FOOT NEUROMA SURGERY  2009   left  . kidney stone removed    . THYROID CYST EXCISION    . TONSILLECTOMY      Current Medications: Current Meds  Medication Sig  . buPROPion (WELLBUTRIN SR) 150 MG 12 hr tablet TAKE 1 TABLET DAILY  . ibuprofen (ADVIL,MOTRIN) 800 MG tablet Take 1 tablet (800 mg total) by mouth every 8 (eight) hours as needed.  Marland Kitchen ketorolac (ACULAR) 0.5 % ophthalmic solution 1 drop 4 (four) times daily.  . Melatonin 3 MG TABS Take 1 tablet by mouth daily.  . methocarbamol (ROBAXIN) 500 MG tablet Take 1 tablet (500 mg total) by mouth 4 (four) times daily.  . Multiple Vitamin (MULTIVITAMIN WITH MINERALS) TABS Take 1 tablet by mouth every morning.   Marland Kitchen PARAGARD INTRAUTERINE COPPER IU by Intrauterine route.    Allergies:   Patient has no known allergies.   Social History   Socioeconomic History  . Marital status: Married    Spouse name: Not on file  . Number of children: 2  . Years of education: College  . Highest education level: Not on file  Occupational History  . 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 Financial resource strain: Not on file  . Food  insecurity:    Worry: Not on file    Inability: Not on file  . Transportation needs:    Medical: Not on file    Non-medical: Not on file  Tobacco Use  . Smoking status: Never Smoker  . Smokeless tobacco: Never Used  Substance and Sexual Activity  . Alcohol use: Yes    Alcohol/week: 0.0 standard drinks    Comment: very rarely  . Drug use: No  . Sexual activity: Yes    Birth control/protection: I.U.D.  Lifestyle  . Physical activity:    Days per week: Not on file    Minutes per session: Not on file  . Stress: Not on file  Relationships  . Social connections:    Talks on phone: Not on file    Gets together: Not on file    Attends religious service: Not on file    Active member of club or organization: Not on file    Attends meetings of clubs or organizations: Not  on file    Relationship status: Not on file  Other Topics Concern  . Not on file  Social History Narrative   Patient lives at home with her family.   Caffeine Use: rarely     Family History:  The patient'sfamily history includes Brain cancer in her father; Cancer in her maternal grandmother, mother, paternal aunt, paternal grandmother, and another family member; Dementia in her maternal grandmother; Diabetes in her father; Heart attack in her father; Heart disease in her father; Hypertension (age of onset: 53) in her brother; Hypertension (age of onset: 63) in her father; Kidney failure in her daughter; Liver disease (age of onset: 30) in her mother; Stroke in her father.  ROS:   Please see the history of present illness. All other systems are reviewed and otherwise negative.    PHYSICAL EXAM:   VS:  BP 100/62   Pulse 89   Ht 5' 2.5" (1.588 m)   Wt 144 lb (65.3 kg)   LMP 02/13/2018 Comment: urine pregnancy test negative 02/19/18  SpO2 98%   BMI 25.92 kg/m   BMI: Body mass index is 25.92 kg/m. GEN: Well nourished, well developed WF in no acute distress HEENT: normocephalic, atraumatic. R eye remains reddened  related to recent ocular dx Neck: no JVD, carotid bruits, or masses Cardiac: RRR; no murmurs, rubs, or gallops, no edema  Respiratory:  clear to auscultation bilaterally, normal work of breathing GI: soft, nontender, nondistended, + BS MS: LLE in cast with mild edema of toes which pt has reviewed with ortho Skin: warm and dry, no rash Neuro:  Alert and Oriented x 3, Strength and sensation are intact, follows commands Psych: euthymic mood, full affect  Wt Readings from Last 3 Encounters:  03/12/18 144 lb (65.3 kg)  02/19/18 144 lb 10 oz (65.6 kg)  02/05/18 143 lb 2 oz (64.9 kg)      Studies/Labs Reviewed:   EKG:   EKG was not ordered today  Recent Labs: 07/14/2017: ALT 10 01/30/2018: BUN 8; Creatinine, Ser 0.96; Hemoglobin 14.7; Magnesium 2.1; Platelets 276; Potassium 4.1; Sodium 141; TSH 1.490   Lipid Panel    Component Value Date/Time   CHOL 158 10/21/2016 1010   TRIG 63 10/21/2016 1010   HDL 59 10/21/2016 1010   CHOLHDL 2.7 10/21/2016 1010   CHOLHDL 2.8 09/09/2012 0910   VLDL 15 09/09/2012 0910   LDLCALC 86 10/21/2016 1010    Additional studies/ records that were reviewed today include: Summarized above.   ASSESSMENT & PLAN:   1. Palpitations - recent labs unrevealing, echo reassuring. Will review event monitor result (rare PACs/PVCs but one ventricular run?) with Dr. Caryl Comes this afternoon as the event monitor has not been formally read yet. Baseline BP is generally too low to add traditional BB therapy. 2. Atypical chest pain - stress echo reassuring. No symptoms to suggest angina. Continue surveillance. 3. ASD closure - appeared stable by recent echo. 4. POTS - encouraged regular hydration. She is also going to be seeing endocrinology to discuss r/o of adrenal insufficiency.  Disposition: F/u with Dr. Burt Knack in 1 year.  Addendum: Dr. Caryl Comes has formally read her monitor and feels like the ventricular run does represent NSVT. He also feels the tracing tagged as sinus  tach is possibly an SVT. He recommends signal average ECG and cardiac MRI to further assess. I will also arrange EP follow-up to review. I spoke with the patient myself on the phone about this finding and will route a message to  the nurse to help arrange.  Medication Adjustments/Labs and Tests Ordered: Current medicines are reviewed at length with the patient today.  Concerns regarding medicines are outlined above. Medication changes, Labs and Tests ordered today are summarized above and listed in the Patient Instructions accessible in Encounters.   Signed, Charlie Pitter, PA-C  03/12/2018 10:33 AM    [facility] [place], [place], [place]  [address] Phone: [phone]; Fax: [phone]

## 2018-03-12 ENCOUNTER — Ambulatory Visit (INDEPENDENT_AMBULATORY_CARE_PROVIDER_SITE_OTHER): Payer: BLUE CROSS/BLUE SHIELD | Admitting: Physician Assistant

## 2018-03-12 ENCOUNTER — Telehealth: Payer: Self-pay | Admitting: Physician Assistant

## 2018-03-12 ENCOUNTER — Encounter: Payer: Self-pay | Admitting: Physician Assistant

## 2018-03-12 VITALS — BP 100/62 | HR 89 | Ht 62.5 in | Wt 144.0 lb

## 2018-03-12 DIAGNOSIS — G90A Postural orthostatic tachycardia syndrome (POTS): Secondary | ICD-10-CM

## 2018-03-12 DIAGNOSIS — R0789 Other chest pain: Secondary | ICD-10-CM | POA: Diagnosis not present

## 2018-03-12 DIAGNOSIS — I4729 Other ventricular tachycardia: Secondary | ICD-10-CM

## 2018-03-12 DIAGNOSIS — Z8774 Personal history of (corrected) congenital malformations of heart and circulatory system: Secondary | ICD-10-CM | POA: Diagnosis not present

## 2018-03-12 DIAGNOSIS — R002 Palpitations: Secondary | ICD-10-CM

## 2018-03-12 DIAGNOSIS — I498 Other specified cardiac arrhythmias: Secondary | ICD-10-CM

## 2018-03-12 DIAGNOSIS — I472 Ventricular tachycardia: Secondary | ICD-10-CM

## 2018-03-12 DIAGNOSIS — I951 Orthostatic hypotension: Secondary | ICD-10-CM

## 2018-03-12 DIAGNOSIS — R000 Tachycardia, unspecified: Secondary | ICD-10-CM

## 2018-03-12 NOTE — Patient Instructions (Addendum)
Medication Instructions:  Your physician recommends that you continue on your current medications as directed. Please refer to the Current Medication list given to you today.  If you need a refill on your cardiac medications before your next appointment, please call your pharmacy.   Lab work: None ordered  If you have labs (blood work) drawn today and your tests are completely normal, you will receive your results only by: Marland Kitchen MyChart Message (if you have MyChart) OR . A paper copy in the mail If you have any lab test that is abnormal or we need to change your treatment, we will call you to review the results.  Testing/Procedures: None ordered  Follow-Up: At [facility], you and your health needs are our priority.  As part of our continuing mission to provide you with exceptional heart care, we have created designated Provider Care Teams.  These Care Teams include your primary Cardiologist (physician) and Advanced Practice Providers (APPs -  Physician Assistants and Nurse Practitioners) who all work together to provide you with the care you need, when you need it. You will need a follow up appointment in:  12 months.  Please call our office 2 months in advance to schedule this appointment.  You may see Sherren Mocha, MD or one of the following Advanced Practice Providers on your designated Care Team: Richardson Dopp, PA-C [place], [place] . Daune Perch, NP  Any Other Special Instructions Will Be Listed Below (If Applicable).

## 2018-03-12 NOTE — Telephone Encounter (Signed)
JW, I spoke with Dr. Caryl Comes about pt's event monitor more in depth. We reviewed the tracing that was originally not pulling up - it shows a run of NSVT. Dr. Caryl Comes recommends arranging signal average EKG (a special type of 20 min EKG done at [facility]) and cardiac MRI to assess for any inflammatory/infiltrative disease or scar in the heart.   I called and spoke with patient about the updated findings and told her I would send this message to you to help arrange. She will also need an EP f/u appt with Caryl Comes after these tests. Thanks!  Dayna Dunn PA-C

## 2018-03-13 NOTE — Telephone Encounter (Signed)
Order for cardiac mri, signal average EKG put in. Will send scheduling a message to schedule.

## 2018-03-19 ENCOUNTER — Telehealth: Payer: Self-pay | Admitting: Physician Assistant

## 2018-03-19 ENCOUNTER — Other Ambulatory Visit: Payer: Self-pay | Admitting: Internal Medicine

## 2018-03-19 MED ORDER — DIAZEPAM 5 MG PO TABS: 5.0000 mg | ORAL_TABLET | Freq: Once | ORAL | 0 refills | Status: DC | PRN

## 2018-03-19 NOTE — Telephone Encounter (Signed)
56m Valium sent in for pt to take 1 hr prior to MRI. She will need someone to drive her to and from her procedure. Pt has verbalized understanding and had no additional questions.

## 2018-03-19 NOTE — Telephone Encounter (Signed)
New Message   PT is calling because she has an MRI tomorrow and she is claustrophobic and is wondering if they can give her medication to help with this.  Please call back

## 2018-03-19 NOTE — Telephone Encounter (Signed)
Will refer this request to the pts EP Physician and RN for further review and recommendation of pre-medication prior to scheduled Cardiac MRI tomorrow.   Below is reference from [facility] Dunn's note as to why Dr Caryl Comes ordered this test:  Disposition: F/u with Dr. Burt Knack in 1 year.  Addendum: Dr. Caryl Comes has formally read her monitor and feels like the ventricular run does represent NSVT. He also feels the tracing tagged as sinus tach is possibly an SVT. He recommends signal average ECG and cardiac MRI to further assess. I will also arrange EP follow-up to review. I spoke with the patient myself on the phone about this finding and will route a message to the nurse to help arrange.   Triage will be glad to call the pt back with Dr Olin Pia recommendation on pre-med prior to MRI.

## 2018-03-20 ENCOUNTER — Ambulatory Visit (HOSPITAL_COMMUNITY)
Admission: RE | Admit: 2018-03-20 | Discharge: 2018-03-20 | Disposition: A | Discharge status: Home / Self Care | Payer: BLUE CROSS/BLUE SHIELD | Source: Ambulatory Visit | Attending: Physician Assistant | Admitting: Physician Assistant

## 2018-03-20 ENCOUNTER — Other Ambulatory Visit: Payer: Self-pay

## 2018-03-20 DIAGNOSIS — I472 Ventricular tachycardia: Secondary | ICD-10-CM | POA: Diagnosis not present

## 2018-03-20 DIAGNOSIS — I4729 Other ventricular tachycardia: Secondary | ICD-10-CM

## 2018-03-20 DIAGNOSIS — R110 Nausea: Secondary | ICD-10-CM | POA: Diagnosis not present

## 2018-03-20 DIAGNOSIS — I498 Other specified cardiac arrhythmias: Secondary | ICD-10-CM | POA: Diagnosis not present

## 2018-03-20 DIAGNOSIS — Z8679 Personal history of other diseases of the circulatory system: Secondary | ICD-10-CM | POA: Diagnosis not present

## 2018-03-20 DIAGNOSIS — R42 Dizziness and giddiness: Secondary | ICD-10-CM | POA: Diagnosis not present

## 2018-03-20 DIAGNOSIS — R5383 Other fatigue: Secondary | ICD-10-CM | POA: Diagnosis not present

## 2018-03-20 MED ORDER — GADOBUTROL 1 MMOL/ML IV SOLN
9.0000 mL | Freq: Once | INTRAVENOUS | Status: AC | PRN
  Administered 2018-03-20: 9 mL via INTRAVENOUS

## 2018-03-24 ENCOUNTER — Ambulatory Visit: Payer: BLUE CROSS/BLUE SHIELD | Admitting: "Endocrinology

## 2018-03-24 ENCOUNTER — Telehealth: Payer: Self-pay | Admitting: Physician Assistant

## 2018-03-24 NOTE — Telephone Encounter (Signed)
Reviewed MRI results with patient:  Notes recorded by Charlie Pitter, PA-C on 03/21/2018 at 7:17 AM EST Pt found to have NSVT on event monitor; Dr. Caryl Comes recommended cardiac MRI. MRI portion looks good but will forward to Dr. Burt Knack for input on PFO. Pt had echo just recently.  Patient had ASD closure in 2012. She is aware that results have been forwarded to Dr. Burt Knack and that we will call her back with the treatment plan. I advised her to keep her appointment on 3/26 with Dr. Caryl Comes unless we call her back and change the plan. She verbalized understanding and agreement and thanked me for the call.

## 2018-03-24 NOTE — Telephone Encounter (Signed)
°  New message     Please call patient with MRI results

## 2018-03-25 DIAGNOSIS — E2740 Unspecified adrenocortical insufficiency: Secondary | ICD-10-CM | POA: Diagnosis not present

## 2018-04-06 ENCOUNTER — Telehealth: Payer: Self-pay

## 2018-04-06 NOTE — Telephone Encounter (Signed)
LVM for return call regarding upcoming appt.

## 2018-04-06 NOTE — Telephone Encounter (Signed)
Pt agreed with televisit on 3/26 with Dr Caryl Comes.

## 2018-04-07 ENCOUNTER — Telehealth: Payer: Self-pay | Admitting: *Deleted

## 2018-04-07 ENCOUNTER — Other Ambulatory Visit (HOSPITAL_COMMUNITY): Payer: Self-pay

## 2018-04-07 DIAGNOSIS — I472 Ventricular tachycardia: Secondary | ICD-10-CM

## 2018-04-07 DIAGNOSIS — R0789 Other chest pain: Secondary | ICD-10-CM

## 2018-04-07 DIAGNOSIS — I4729 Other ventricular tachycardia: Secondary | ICD-10-CM

## 2018-04-07 DIAGNOSIS — Z8774 Personal history of (corrected) congenital malformations of heart and circulatory system: Secondary | ICD-10-CM

## 2018-04-07 NOTE — Telephone Encounter (Signed)
-----   Message from Charlie Pitter, [place] sent at 04/06/2018  8:41 AM EDT ----- Please call pt. I had Dr. Burt Knack review study. He states "Reviewed echo and MR studies. Would be unusual to have left-to-right flow remotely after PFO closure, especially with normal RA/RV size and function. I would recommend a limited echo with bubble study to evaluate for residual atrial-level shunt. This should be done electively after the Covid-19 measures are lifted. thanks " [employer] PA-C

## 2018-04-07 NOTE — Telephone Encounter (Signed)
Called pt re: cardiac mri results. Voicemail is full and couldn't leave a message. Will try again later.

## 2018-04-08 ENCOUNTER — Telehealth: Payer: Self-pay

## 2018-04-08 NOTE — Telephone Encounter (Signed)
Called pt to verify there she was not having any issues with her hardware prior to evisit. Pt confirmed everything was working properly. Informed her that there is information about the visit in my chart that she should read before her visit. Pt verbalized understanding and is aware she will receive a call from the her providers nurse 15 minutes prior to evisit.

## 2018-04-09 ENCOUNTER — Ambulatory Visit (INDEPENDENT_AMBULATORY_CARE_PROVIDER_SITE_OTHER): Payer: BLUE CROSS/BLUE SHIELD | Admitting: Internal Medicine

## 2018-04-09 ENCOUNTER — Other Ambulatory Visit: Payer: Self-pay

## 2018-04-09 ENCOUNTER — Telehealth: Payer: Self-pay | Admitting: Internal Medicine

## 2018-04-09 VITALS — BP 98/64 | HR 76 | Ht 63.0 in | Wt 143.0 lb

## 2018-04-09 DIAGNOSIS — G90A Postural orthostatic tachycardia syndrome (POTS): Secondary | ICD-10-CM

## 2018-04-09 DIAGNOSIS — I4729 Other ventricular tachycardia: Secondary | ICD-10-CM

## 2018-04-09 DIAGNOSIS — I472 Ventricular tachycardia: Secondary | ICD-10-CM | POA: Diagnosis not present

## 2018-04-09 DIAGNOSIS — I498 Other specified cardiac arrhythmias: Secondary | ICD-10-CM

## 2018-04-09 DIAGNOSIS — R000 Tachycardia, unspecified: Secondary | ICD-10-CM

## 2018-04-09 DIAGNOSIS — I951 Orthostatic hypotension: Secondary | ICD-10-CM

## 2018-04-09 NOTE — Progress Notes (Addendum)
Electrophysiology TeleHealth Note   Due to national recommendations of social distancing due to COVID 19, an audio/video telehealth visit is felt to be most appropriate for this patient at this time.  See MyChart message from today for the patient's consent to telehealth for [facility].   Date:  04/09/2018   ID:  Candice Estrada, DOB August 16, 1975, MRN [id number]  Location: patient's home  Provider location: [address] [place]  Evaluation Performed: Follow-up visit  PCP:  Alycia Rossetti, MD  Cardiologist:  Sherren Mocha, MD   Electrophysiologist:  Virl Axe, MD   Chief Complaint:  Palpitations   History of Present Illness:    Candice Estrada is a 43 y.o. female who presents via audio/video conferencing for a telehealth visit today.  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  Found on monitoring to have nonsustained ventricular tachycardia.  Echocardiogram and signal average ECG have been normal.  Also symptoms of autonomic insufficiency.  Son has recently been diagnosed with an eye neoplasm\  Interval signal-averaged ECG was normal  1/20 echocardiogram-stress with late exercise followed blood pressure thought to be consistent with autonomic insufficiency otherwise normal  3/20 cMRI normal LV function without evidence of RV dysplasia.  Question residual left-right shunt with referral to Dr. Burt Knack suggested >> he reviewed >> I would recommend a limited echo with bubble study to evaluate for residual atrial-level shunt. This should be done electively after the Covid-19 measures are lifted. thanks  Reviewed patient symptoms.  Nocturnal palpitations seem to correlate with the episode of nonsustained ventricular tachycardia noted on the monitor 3/20.  This is associated with residual lightheadedness and weakness.  Otherwise episodes  of lightheadedness are not associated with palpitations.  They are triggered by slight movements of the head even while seated.  Most significantly however is occurring post micturition/defecation.  She had ankle surgery 6 or so weeks ago and has been in a cast.  She has tended to refrain from going to the bathroom too early because it is inconvenient.       the patient denies symptoms of fevers, chills, cough, or new SOB worrisome for COVID 19.   Past Medical History:  Diagnosis Date  . Anxiety   . ASD (atrial septal defect), ostium secundum    Status post closure 08/20/10 with Dr. Burt Knack; procedure complicated by right groin hematoma and acute blood loss anemia  . BV (bacterial vaginosis) 10/06/2014  . Complication of anesthesia    VASO-VAGAL RESPONSE IN RECOVERY ROOM AFTER HEART SURGERY  . Hematuria   . History of psoriasis    scalp  . History of TIAs   . Hx of dizziness    random episodes "because my BP is so low"  . Hypotension   . IUD (intrauterine device) in place 12/16/2012  . Kidney stone   . Meniere's disease   . Night terrors   . NSVT (nonsustained ventricular tachycardia) (HCC)    a. 4 beat run on Zio 2015.  Marland Kitchen Ocular neoplasm   . Ovarian cyst   . POTS (postural orthostatic tachycardia syndrome)   . SBO (small bowel obstruction) ([place])   . 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 Thyroid enlarged 12/16/2012   History of cyst will get [place]    Past Surgical History:  Procedure Laterality Date  . ANKLE  ARTHROSCOPY WITH RECONSTRUCTION Left 02/19/2018   Procedure: LEFT ANKLE ARTHROSCOPIC DEBRIDEMENT, LATERAL LIGAMENT RECONSTRUCTION,  OSTEOCHONDRAL TREATMENT;  Surgeon: Erle Crocker, MD;  Location: [facility];  Service: Orthopedics;  Laterality: Left;  . ASD REPAIR  08/20/2010  . CARDIAC SURGERY    . CYSTOSCOPY WITH RETROGRADE PYELOGRAM, URETEROSCOPY AND STENT PLACEMENT Left 08/31/2014   Procedure: CYSTOSCOPY WITH BILATERAL RETROGRADE PYELOGRAM,  URETEROSCOPY AND STENT PLACEMENT, STONE EXTRACTION WITH BASKET;  Surgeon: Cleon Gustin, MD;  Location: [facility];  Service: Urology;  Laterality: Left;  . FOOT NEUROMA SURGERY  2009   left  . kidney stone removed    . THYROID CYST EXCISION    . TONSILLECTOMY      Current Outpatient Medications  Medication Sig Dispense Refill  . buPROPion (WELLBUTRIN SR) 150 MG 12 hr tablet TAKE 1 TABLET DAILY 90 tablet 4  . ibuprofen (ADVIL,MOTRIN) 800 MG tablet Take 1 tablet (800 mg total) by mouth every 8 (eight) hours as needed. 90 tablet 4  . Melatonin 3 MG TABS Take 1 tablet by mouth daily.    . Multiple Vitamin (MULTIVITAMIN WITH MINERALS) TABS Take 1 tablet by mouth every morning.     Marland Kitchen PARAGARD INTRAUTERINE COPPER IU by Intrauterine route.     No current facility-administered medications for this visit.     Allergies:   Patient has no known allergies.   Social History:  The patient  reports that she has never smoked. She has never used smokeless tobacco. She reports current alcohol use. She reports that she does not use drugs.   Family History:  The patient's   family history includes Brain cancer in her father; Cancer in her maternal grandmother, mother, paternal aunt, paternal grandmother, and another family member; Dementia in her maternal grandmother; Diabetes in her father; Heart attack in her father; Heart disease in her father; Hypertension (age of onset: 7) in her brother; Hypertension (age of onset: 17) in her father; Kidney failure in her daughter; Liver disease (age of onset: 40) in her mother; Stroke in her father.   ROS:  Please see the history of present illness.   All other systems are personally reviewed and negative.    Exam:    Vital Signs:  BP 98/64   Pulse 76   Ht 5' 3"  (1.6 m)   Wt 143 lb (64.9 kg)   BMI 25.33 kg/m     Well appearing, alert and conversant, regular work of breathing,  good skin color Eyes- anicteric, neuro- grossly intact, skin- no apparent rash  or lesions or cyanosis, mouth- oral mucosa is pink   Labs/Other Tests and Data Reviewed:    Recent Labs: 07/14/2017: ALT 10 01/30/2018: BUN 8; Creatinine, Ser 0.96; Hemoglobin 14.7; Magnesium 2.1; Platelets 276; Potassium 4.1; Sodium 141; TSH 1.490   Wt Readings from Last 3 Encounters:  04/09/18 143 lb (64.9 kg)  03/12/18 144 lb (65.3 kg)  02/19/18 144 lb 10 oz (65.6 kg)     Other studies personally reviewed: Additional studies/ records that were reviewed today include: As above      The patient presents wearable device technology report for my review today.   The tracings reveal VT NS and some sinus tach    ASSESSMENT & PLAN:    VT NS  Autonomic insufficiency  Lightheadedness   Ocular neoplasm    The patient has nonsustained ventricular tachycardia.  Ongoing surveillance of myocardial function and structure with MRI and signal average ECG continue to be normal.  It remains a symptom issue but at this point no evidence of significant underlying structural heart disease.  It is been relatively sporadic and we will not treated at this time.  She also has autonomic insufficiency which is being aggravated by her not wanting to go to the bathroom with her ankle fracture.  We have discussed the physiology.  Hopefully going more frequently will obviate some of her symptoms.  I trust the [facility] has undertaken neurological imaging related to her ocular neoplasm although on review, I do not see it.  And I also do not know whether is necessary; but I am bothered by the lightheadedness with simple head movements.  She is to follow-up with [facility] in a couple of months (depending on what happens with COVID-19)  COVID 19 screen The patient denies symptoms of COVID 19 at this time.  The importance of social distancing was discussed today.  Follow-up:  6 month      Current medicines are reviewed at length with the patient today.   The patient does not have concerns regarding her medicines.   The following changes were made today:  none  Labs/ tests ordered today include: none No orders of the defined types were placed in this encounter.   Future tests ( post COVID )     Patient Risk:  after full review of this patients clinical status, I feel that they are at moderate risk at this time.  Today, I have spent 30 minutes with the patient with telehealth technology discussing As above   Signed, Virl Axe, MD  04/09/2018 11:03 AM     [place] [address] [phone] (office) [phone] (fax)

## 2018-04-09 NOTE — Telephone Encounter (Signed)
Follow Up:    Pt said she had an E-Visit with Dr Caryl Comes this morning. She said she needs to talk to you about her E-Visit. please.

## 2018-04-09 NOTE — Telephone Encounter (Signed)
Pt had questions regarding the need for a bubble study. Per Dr Caryl Comes, he will review with Dr Burt Knack. We will contact pt later in the month and/or when COVID quarantine ends. Pt verbalized understanding and has no additional questions.

## 2018-04-13 NOTE — Telephone Encounter (Signed)
-----   Message from Charlie Pitter, [place] sent at 04/06/2018  8:41 AM EDT ----- Please call pt. I had Dr. Burt Knack review study. He states "Reviewed echo and MR studies. Would be unusual to have left-to-right flow remotely after PFO closure, especially with normal RA/RV size and function. I would recommend a limited echo with bubble study to evaluate for residual atrial-level shunt. This should be done electively after the Covid-19 measures are lifted. thanks " [employer] PA-C

## 2018-05-04 ENCOUNTER — Other Ambulatory Visit: Payer: Self-pay | Admitting: Family Medicine

## 2018-05-04 DIAGNOSIS — Z1231 Encounter for screening mammogram for malignant neoplasm of breast: Secondary | ICD-10-CM

## 2018-05-13 ENCOUNTER — Telehealth: Payer: Self-pay | Admitting: Physician Assistant

## 2018-05-13 DIAGNOSIS — I4729 Other ventricular tachycardia: Secondary | ICD-10-CM

## 2018-05-13 DIAGNOSIS — I472 Ventricular tachycardia: Secondary | ICD-10-CM

## 2018-05-13 NOTE — Telephone Encounter (Signed)
Spoke with pt today who was asking about her bubble study. She understands we are not doing OP echo's at the moment, but will be called to schedule when restrictions are lifted.   Secondly, she wanted to discuss an increase in her nocturnal palpitations. She states they have woken her up and been happening about every other night. She has a hard time catching her breath when this happens. Pt also reports having a major spell of dizziness/light headedness while driving the other day. She did not have palpitations at the time of her dizziness.   I reviewed Dr Olin Pia note and there was no discussion of treatment of palpitations. I advised her I would speak to him about this when he returns to the office. I also inquired about her fluid and salt intake as her dizzy spell may have correlated with low fluid intake. I advised her to increase her water intake, urinating every 2 hours with clear yellow urine. I also suggested looking into "Liquid IV" for supplemental electrolyte/sodium intake. 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 I strongly advised her to increase her water during these warmer months to help mitigate some of her autonomic symptoms. She is also going to look into wearing an abdominal binder.  She understands I will forward and discuss with Dr Caryl Comes in regards to her palpitations.

## 2018-05-13 NOTE — Telephone Encounter (Signed)
New Message   Patient would like a nurse to call her about Echo Bubble study that was ordered.  She states she needs to have it done due to her issues.

## 2018-05-14 NOTE — Telephone Encounter (Signed)
Would do 14-28  day zio patch to clarify the mechanism of tachypalpiations Before we begin therapy

## 2018-05-15 NOTE — Telephone Encounter (Signed)
Spoke with pt and she agrees to have a monitor placed. She states she continues to have her nocturnal tachy palpitations, the most recent being last night. I discussed with pt, we will first send her a 14 day monitor. If she does not feel she had any episodes during that time, she will call me back on day 8. At that time, we will send her a second 14 day monitor to place.   Pt has agreed with this and has no questions.

## 2018-05-19 ENCOUNTER — Telehealth: Payer: Self-pay | Admitting: Adult Health

## 2018-05-19 ENCOUNTER — Telehealth: Payer: Self-pay | Admitting: *Deleted

## 2018-05-19 NOTE — Telephone Encounter (Signed)
mychart message to patient.

## 2018-05-19 NOTE — Telephone Encounter (Signed)
Pt is states she has a nodule in the inside of her vagina and requesting an appt.

## 2018-05-19 NOTE — Telephone Encounter (Signed)
[employer] to mail a 14 day ZIO XT long term monitor to your home.  Patient aware of directions since she wore the same type of monitor earlier this year.

## 2018-05-20 ENCOUNTER — Other Ambulatory Visit: Payer: Self-pay | Admitting: Adult Health

## 2018-05-20 ENCOUNTER — Encounter: Payer: Self-pay | Admitting: *Deleted

## 2018-05-21 ENCOUNTER — Ambulatory Visit: Payer: BLUE CROSS/BLUE SHIELD | Admitting: Adult Health

## 2018-06-17 ENCOUNTER — Telehealth (HOSPITAL_COMMUNITY): Payer: Self-pay | Admitting: *Deleted

## 2018-06-17 NOTE — Telephone Encounter (Signed)
  Left message about appointment time, but told her to call back about COVID screening.

## 2018-06-17 NOTE — Telephone Encounter (Signed)

## 2018-06-18 ENCOUNTER — Other Ambulatory Visit: Payer: Self-pay

## 2018-06-18 ENCOUNTER — Telehealth: Payer: Self-pay | Admitting: Radiology

## 2018-06-18 ENCOUNTER — Ambulatory Visit (HOSPITAL_COMMUNITY): Payer: BC Managed Care – PPO | Attending: Cardiology

## 2018-06-18 DIAGNOSIS — I472 Ventricular tachycardia: Secondary | ICD-10-CM | POA: Insufficient documentation

## 2018-06-18 DIAGNOSIS — R0789 Other chest pain: Secondary | ICD-10-CM

## 2018-06-18 DIAGNOSIS — I4729 Other ventricular tachycardia: Secondary | ICD-10-CM

## 2018-06-18 DIAGNOSIS — Z8774 Personal history of (corrected) congenital malformations of heart and circulatory system: Secondary | ICD-10-CM | POA: Insufficient documentation

## 2018-06-18 NOTE — Telephone Encounter (Signed)
Received a call from [place] they had received patients monitor back with no data/not activated. I reached out to the patient and she did wear it for 14 days and stated she is positive she activated it. But did mention it was blinking. Unfortunately I will have to re-enroll patient for a new monitor as there is no data to be read. Patient is aware and agrees to wear a new monitor.

## 2018-06-23 ENCOUNTER — Encounter: Payer: Self-pay | Admitting: *Deleted

## 2018-06-23 ENCOUNTER — Telehealth: Payer: Self-pay | Admitting: *Deleted

## 2018-06-23 MED ORDER — MECLIZINE HCL 25 MG PO TABS: ORAL_TABLET | ORAL | 0 refills | Status: DC

## 2018-06-23 NOTE — Telephone Encounter (Signed)
Pt has been made aware that we sent in 14 days supply of Meclizine and that she needs to f/u with her PCP re: her dizziness.

## 2018-06-29 ENCOUNTER — Ambulatory Visit: Payer: Self-pay

## 2018-06-29 ENCOUNTER — Ambulatory Visit
Admission: RE | Admit: 2018-06-29 | Discharge: 2018-06-29 | Disposition: A | Discharge status: Home / Self Care | Payer: BC Managed Care – PPO | Source: Ambulatory Visit | Attending: Family Medicine | Admitting: Family Medicine

## 2018-06-29 ENCOUNTER — Other Ambulatory Visit: Payer: Self-pay

## 2018-06-29 DIAGNOSIS — Z1231 Encounter for screening mammogram for malignant neoplasm of breast: Secondary | ICD-10-CM | POA: Diagnosis not present

## 2018-06-30 DIAGNOSIS — H524 Presbyopia: Secondary | ICD-10-CM | POA: Diagnosis not present

## 2018-07-23 ENCOUNTER — Ambulatory Visit: Payer: BC Managed Care – PPO | Admitting: Adult Health

## 2018-07-24 DIAGNOSIS — M87076 Idiopathic aseptic necrosis of unspecified foot: Secondary | ICD-10-CM | POA: Insufficient documentation

## 2018-08-10 ENCOUNTER — Encounter: Payer: Self-pay | Admitting: Adult Health

## 2018-08-10 ENCOUNTER — Ambulatory Visit (INDEPENDENT_AMBULATORY_CARE_PROVIDER_SITE_OTHER): Payer: BC Managed Care – PPO | Admitting: Adult Health

## 2018-08-10 ENCOUNTER — Other Ambulatory Visit: Payer: Self-pay

## 2018-08-10 VITALS — BP 105/75 | HR 84 | Ht 63.0 in | Wt 153.0 lb

## 2018-08-10 DIAGNOSIS — R4589 Other symptoms and signs involving emotional state: Secondary | ICD-10-CM | POA: Insufficient documentation

## 2018-08-10 DIAGNOSIS — N926 Irregular menstruation, unspecified: Secondary | ICD-10-CM | POA: Diagnosis not present

## 2018-08-10 DIAGNOSIS — Z975 Presence of (intrauterine) contraceptive device: Secondary | ICD-10-CM

## 2018-08-10 DIAGNOSIS — R232 Flushing: Secondary | ICD-10-CM | POA: Insufficient documentation

## 2018-08-10 DIAGNOSIS — R635 Abnormal weight gain: Secondary | ICD-10-CM | POA: Insufficient documentation

## 2018-08-10 MED ORDER — PAROXETINE MESYLATE 7.5 MG PO CAPS: 1.0000 | ORAL_CAPSULE | Freq: Every day | ORAL | 3 refills | Status: DC

## 2018-08-10 NOTE — Progress Notes (Signed)
Patient ID: Candice Estrada, female   DOB: 1975/11/03, 43 y.o.   MRN: [id number] History of Present Illness: Candice Estrada is a 43 year old white female, married, [id number], in complaining of irregular bleeding with IUD, hot flashes, moody, is mean and weight gain. 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 But she is getting in ground pool, and hopes she can swim and walk in the water.  PCP is Dr Buelah Manis.  Current Medications, Allergies, Past Medical History, Past Surgical History, Family History and Social History were reviewed in [facility] record.     Review of Systems: +irregualr bleeding with IUD  +hot flashes +mood swings, mean +weight gain     Physical Exam:BP 105/75 (BP Location: Left Arm, Patient Position: Sitting, Cuff Size: Normal)   Pulse 84   Ht 5' 3"  (1.6 m)   Wt 153 lb (69.4 kg)   BMI 27.10 kg/m  General:  Well developed, well nourished, no acute distress Skin:  Warm and dry Pelvic:  External genitalia is normal in appearance, no lesions.  The vagina is normal in appearance. Urethra has no lesions or masses. The cervix is bulbous,+IUD string and blood at os.  Uterus is felt to be normal size, shape, and contour.  No adnexal masses or tenderness noted.Bladder is non tender, no masses felt. Psych:  No mood changes, alert and cooperative,seems happy  Fall risk is low. Examination chaperoned by Diona Fanti CMA. Discussed that could be early menopause but with her heart defect and history would not recommend HRT.will try [place], and watch the spotting.   Impression: 1. Irregular bleeding   2. IUD (intrauterine device) in place   3. Hot flashes   4. Moody   5. Weight gain       Plan: Meds ordered this encounter  Medications  . PARoxetine Mesylate 7.5 MG CAPS    Sig: Take 1 capsule by mouth daily.    Dispense:  30 capsule    Refill:  3    Order Specific  Question:   Supervising Provider    Answer:   Tania Ade H [[id number]]  Follow up in 9 weeks, if still spotting will get [place]  Review handout on Menopause

## 2018-08-10 NOTE — Patient Instructions (Signed)
Menopause Menopause is the normal time of life when menstrual periods stop completely. It is usually confirmed by 12 months without a menstrual period. The transition to menopause (perimenopause) most often happens between the ages of 45 and 55. During perimenopause, hormone levels change in your body, which can cause symptoms and affect your health. Menopause may increase your risk for:  Loss of bone (osteoporosis), which causes bone breaks (fractures).  Depression.  Hardening and narrowing of the arteries (atherosclerosis), which can cause heart attacks and strokes. What are the causes? This condition is usually caused by a natural change in hormone levels that happens as you get older. The condition may also be caused by surgery to remove both ovaries (bilateral oophorectomy). What increases the risk? This condition is more likely to start at an earlier age if you have certain medical conditions or treatments, including:  A tumor of the pituitary gland in the brain.  A disease that affects the ovaries and hormone production.  Radiation treatment for cancer.  Certain cancer treatments, such as chemotherapy or hormone (anti-estrogen) therapy.  Heavy smoking and excessive alcohol use.  Family history of early menopause. This condition is also more likely to develop earlier in women who are very thin. What are the signs or symptoms? Symptoms of this condition include:  Hot flashes.  Irregular menstrual periods.  Night sweats.  Changes in feelings about sex. This could be a decrease in sex drive or an increased comfort around your sexuality.  Vaginal dryness and thinning of the vaginal walls. This may cause painful intercourse.  Dryness of the skin and development of wrinkles.  Headaches.  Problems sleeping (insomnia).  Mood swings or irritability.  Memory problems.  Weight gain.  Hair growth on the face and chest.  Bladder infections or problems with urinating. How  is this diagnosed? This condition is diagnosed based on your medical history, a physical exam, your age, your menstrual history, and your symptoms. Hormone tests may also be done. How is this treated? In some cases, no treatment is needed. You and your health care provider should make a decision together about whether treatment is necessary. Treatment will be based on your individual condition and preferences. Treatment for this condition focuses on managing symptoms. Treatment may include:  Menopausal hormone therapy (MHT).  Medicines to treat specific symptoms or complications.  Acupuncture.  Vitamin or herbal supplements. Before starting treatment, make sure to let your health care provider know if you have a personal or family history of:  Heart disease.  Breast cancer.  Blood clots.  Diabetes.  Osteoporosis. Follow these instructions at home: Lifestyle  Do not use any products that contain nicotine or tobacco, such as cigarettes and e-cigarettes. If you need help quitting, ask your health care provider.  Get at least 30 minutes of physical activity on 5 or more days each week.  Avoid alcoholic and caffeinated beverages, as well as spicy foods. This may help prevent hot flashes.  Get 7-8 hours of sleep each night.  If you have hot flashes, try: ? Dressing in layers. ? Avoiding things that may trigger hot flashes, such as spicy food, warm places, or stress. ? Taking slow, deep breaths when a hot flash starts. ? Keeping a fan in your home and office.  Find ways to manage stress, such as deep breathing, meditation, or journaling.  Consider going to group therapy with other women who are having menopause symptoms. Ask your health care provider about recommended group therapy meetings. Eating and   drinking  Eat a healthy, balanced diet that contains whole grains, lean protein, low-fat dairy, and plenty of fruits and vegetables.  Your health care provider may recommend  adding more soy to your diet. Foods that contain soy include tofu, tempeh, and soy milk.  Eat plenty of foods that contain calcium and vitamin D for bone health. Items that are rich in calcium include low-fat milk, yogurt, beans, almonds, sardines, broccoli, and kale. Medicines  Take over-the-counter and prescription medicines only as told by your health care provider.  Talk with your health care provider before starting any herbal supplements. If prescribed, take vitamins and supplements as told by your health care provider. These may include: ? Calcium. Women age 51 and older should get 1,200 mg (milligrams) of calcium every day. ? Vitamin D. Women need 600-800 International Units of vitamin D each day. ? Vitamins B12 and B6. Aim for 50 micrograms of B12 and 1.5 mg of B6 each day. General instructions  Keep track of your menstrual periods, including: ? When they occur. ? How heavy they are and how long they last. ? How much time passes between periods.  Keep track of your symptoms, noting when they start, how often you have them, and how long they last.  Use vaginal lubricants or moisturizers to help with vaginal dryness and improve comfort during sex.  Keep all follow-up visits as told by your health care provider. This is important. This includes any group therapy or counseling. Contact a health care provider if:  You are still having menstrual periods after age 55.  You have pain during sex.  You have not had a period for 12 months and you develop vaginal bleeding. Get help right away if:  You have: ? Severe depression. ? Excessive vaginal bleeding. ? Pain when you urinate. ? A fast or irregular heart beat (palpitations). ? Severe headaches. ? Abdomen (abdominal) pain or severe indigestion.  You fell and you think you have a broken bone.  You develop leg or chest pain.  You develop vision problems.  You feel a lump in your breast. Summary  Menopause is the normal  time of life when menstrual periods stop completely. It is usually confirmed by 12 months without a menstrual period.  The transition to menopause (perimenopause) most often happens between the ages of 45 and 55.  Symptoms can be managed through medicines, lifestyle changes, and complementary therapies such as acupuncture.  Eat a balanced diet that is rich in nutrients to promote bone health and heart health and to manage symptoms during menopause. This information is not intended to replace advice given to you by your health care provider. Make sure you discuss any questions you have with your health care provider. Document Released: 03/23/2003 Document Revised: 12/13/2016 Document Reviewed: 02/03/2016 Elsevier Patient Education  2020 [employer]  

## 2018-08-11 ENCOUNTER — Other Ambulatory Visit: Payer: Self-pay | Admitting: Adult Health

## 2018-08-11 MED ORDER — PAROXETINE HCL 10 MG PO TABS: 10.0000 mg | ORAL_TABLET | Freq: Every day | ORAL | 2 refills | Status: DC

## 2018-08-11 NOTE — Progress Notes (Signed)
Will rx paxil 10 mg

## 2018-08-13 ENCOUNTER — Ambulatory Visit: Payer: BC Managed Care – PPO | Admitting: Podiatry

## 2018-08-18 ENCOUNTER — Ambulatory Visit: Payer: BC Managed Care – PPO

## 2018-08-18 ENCOUNTER — Ambulatory Visit (INDEPENDENT_AMBULATORY_CARE_PROVIDER_SITE_OTHER)

## 2018-08-18 ENCOUNTER — Other Ambulatory Visit: Payer: Self-pay

## 2018-08-18 ENCOUNTER — Ambulatory Visit (INDEPENDENT_AMBULATORY_CARE_PROVIDER_SITE_OTHER): Admitting: Podiatry

## 2018-08-18 ENCOUNTER — Encounter: Payer: Self-pay | Admitting: Podiatry

## 2018-08-18 VITALS — BP 115/76 | HR 95 | Temp 97.6°F | Resp 16

## 2018-08-18 DIAGNOSIS — T148XXA Other injury of unspecified body region, initial encounter: Secondary | ICD-10-CM

## 2018-08-18 DIAGNOSIS — S93422A Sprain of deltoid ligament of left ankle, initial encounter: Secondary | ICD-10-CM

## 2018-08-18 DIAGNOSIS — M778 Other enthesopathies, not elsewhere classified: Secondary | ICD-10-CM

## 2018-08-18 MED ORDER — MELOXICAM 15 MG PO TABS: 15.0000 mg | ORAL_TABLET | Freq: Every day | ORAL | 3 refills | Status: DC

## 2018-08-18 NOTE — Progress Notes (Signed)
Subjective:  Patient ID: Candice Estrada, female    DOB: [birth year],  MRN: [id number] HPI Chief Complaint  Patient presents with  . Foot Pain    Dorsal forefoot and toes left - patient had ankle surgery on Feb 6th from an injury she had at work, while in PT she started to have pain in forefoot and toe, swelling, had MRI - said she had avascular necrosis in a couple of her toes, can't wear boot caused pain, tried heat and ice-hasn't been back at work since her injury-would like a 2nd opinion  . New Patient (Initial Visit)    43 y.o. female presents with the above complaint.   ROS: Denies fever chills nausea vomiting muscle aches pains calf pain back pain chest pain shortness of breath.  States that her surgical foot did not start hurting until her last visit to physical therapy and it started bothering her immediately.  Past Medical History:  Diagnosis Date  . Anxiety   . ASD (atrial septal defect), ostium secundum    Status post closure 08/20/10 with Dr. Burt Knack; procedure complicated by right groin hematoma and acute blood loss anemia  . BV (bacterial vaginosis) 10/06/2014  . Complication of anesthesia    VASO-VAGAL RESPONSE IN RECOVERY ROOM AFTER HEART SURGERY  . Hematuria   . History of psoriasis    scalp  . History of TIAs   . Hx of dizziness    random episodes "because my BP is so low"  . Hypotension   . IUD (intrauterine device) in place 12/16/2012  . Kidney stone   . Meniere's disease   . Night terrors   . NSVT (nonsustained ventricular tachycardia) (HCC)    a. 4 beat run on Zio 2015.  Marland Kitchen Ocular neoplasm   . Ovarian cyst   . POTS (postural orthostatic tachycardia syndrome)   . SBO (small bowel obstruction) ([facility])   . 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 Thyroid enlarged 12/16/2012   History of cyst will get [place]   Past Surgical History:  Procedure Laterality Date  . ANKLE ARTHROSCOPY WITH RECONSTRUCTION Left 02/19/2018   Procedure: LEFT ANKLE  ARTHROSCOPIC DEBRIDEMENT, LATERAL LIGAMENT RECONSTRUCTION,  OSTEOCHONDRAL TREATMENT;  Surgeon: Erle Crocker, MD;  Location: [facility];  Service: Orthopedics;  Laterality: Left;  . ASD REPAIR  08/20/2010  . CARDIAC SURGERY    . CYSTOSCOPY WITH RETROGRADE PYELOGRAM, URETEROSCOPY AND STENT PLACEMENT Left 08/31/2014   Procedure: CYSTOSCOPY WITH BILATERAL RETROGRADE PYELOGRAM, URETEROSCOPY AND STENT PLACEMENT, STONE EXTRACTION WITH BASKET;  Surgeon: Cleon Gustin, MD;  Location: [facility];  Service: Urology;  Laterality: Left;  . FOOT NEUROMA SURGERY  2009   left  . kidney stone removed    . THYROID CYST EXCISION    . TONSILLECTOMY    . vascular necrosis left foot      Current Outpatient Medications:  .  buPROPion (WELLBUTRIN SR) 150 MG 12 hr tablet, TAKE 1 TABLET DAILY, Disp: 90 tablet, Rfl: 3 .  ibuprofen (ADVIL,MOTRIN) 800 MG tablet, Take 1 tablet (800 mg total) by mouth every 8 (eight) hours as needed., Disp: 90 tablet, Rfl: 4 .  meclizine (ANTIVERT) 25 MG tablet, Take 1/2 to 1 tablet by mouth twice a day as needed for vertigo, Disp: 28 tablet, Rfl: 0 .  Melatonin 3 MG TABS, Take 1 tablet by mouth daily., Disp: , Rfl:  .  meloxicam (MOBIC) 15 MG tablet, Take 1 tablet (15 mg total) by mouth daily., Disp:  30 tablet, Rfl: 3 .  Multiple Vitamin (MULTIVITAMIN WITH MINERALS) TABS, Take 1 tablet by mouth every morning. , Disp: , Rfl:  .  PARAGARD INTRAUTERINE COPPER IU, by Intrauterine route., Disp: , Rfl:   No Known Allergies Review of Systems Objective:   Vitals:   08/18/18 0924  BP: 115/76  Pulse: 95  Resp: 16  Temp: 97.6 F (36.4 C)    General: Well developed, nourished, in no acute distress, alert and oriented x3   Dermatological: Skin is warm, dry and supple bilateral. Nails x 10 are well maintained; remaining integument appears unremarkable at this time. There are no open sores, no preulcerative lesions, no rash or signs of infection present.  Vascular:  Dorsalis Pedis artery and Posterior Tibial artery pedal pulses are 2/4 bilateral with immedate capillary fill time. Pedal hair growth present. No varicosities and no lower extremity edema present bilateral.   Neruologic: Grossly intact via light touch bilateral. Vibratory intact via tuning fork bilateral. Protective threshold with Semmes Wienstein monofilament intact to all pedal sites bilateral. Patellar and Achilles deep tendon reflexes 2+ bilateral. No Babinski or clonus noted bilateral.   Musculoskeletal: No gross boney pedal deformities bilateral. No pain, crepitus, or limitation noted with foot and ankle range of motion bilateral. Muscular strength 5/5 in all groups tested bilateral.  First metatarsal appears to be sitting very high when you evaluated compared to the other foot stating that it was never like this until just recently.  She also has swelling over top of the first and second intermetatarsal spaces with severe pain on palpation to the second metatarsal all the way to its mid diaphyseal region and proximal diaphyseal region.  She has limited range of motion due to the first metatarsal phalangeal joint and the elevation of the first metatarsal itself.  She has pain on range of motion of the second toe as well as the third toe but to a lesser degree.    Gait: Unassisted, Nonantalgic.    Radiographs: Radiographs taken today demonstrate an osseously mature individual with no obvious signs of surgery to the rear foot.  However she does have a distraction of the plantar aspect of the first TMT with dorsiflexion of the first metatarsophalangeal joint the joint itself appears to be open with minimal spurring.  The distal aspect of the second metatarsal appears to be sclerotic and subchondral region.  No fractures are identified.  Soft tissue swelling is noted dorsally.    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  Plan: Discussed etiology pathology and surgical therapies at this point I would like to take a look at the MRI the CT scans for evaluation and a treatment plan.  We will discuss this and our clinics Thursday morning.  I did start her on a One-A-Day nonsteroidal meloxicam 15 mg.  I also put her in a Darco shoe.     Zackrey Dyar T. [place], [place]

## 2018-08-25 ENCOUNTER — Telehealth: Payer: Self-pay | Admitting: *Deleted

## 2018-08-25 NOTE — Telephone Encounter (Signed)
Mailed copy of MRI disc to [facility].

## 2018-08-26 ENCOUNTER — Telehealth: Payer: Self-pay | Admitting: Podiatry

## 2018-08-26 NOTE — Telephone Encounter (Signed)
Pt is requesting medical records form be emailed to her at [email or web address]. Pt is requesting her ov note from 04 August be released to her via [employer].

## 2018-08-31 ENCOUNTER — Telehealth: Payer: Self-pay | Admitting: Podiatry

## 2018-08-31 NOTE — Telephone Encounter (Signed)
SOS - MRI states will fax the MRI report. Received the MRI repost and faxed to [facility].

## 2018-08-31 NOTE — Telephone Encounter (Signed)
[facility] overread called stating that had received a report for the patient but it was missing the first page.  Please refax to [phone].

## 2018-09-08 ENCOUNTER — Ambulatory Visit (INDEPENDENT_AMBULATORY_CARE_PROVIDER_SITE_OTHER): Admitting: Podiatry

## 2018-09-08 ENCOUNTER — Telehealth: Payer: Self-pay | Admitting: *Deleted

## 2018-09-08 ENCOUNTER — Other Ambulatory Visit: Payer: Self-pay

## 2018-09-08 ENCOUNTER — Encounter: Payer: Self-pay | Admitting: Podiatry

## 2018-09-08 VITALS — Temp 98.0°F

## 2018-09-08 DIAGNOSIS — T148XXA Other injury of unspecified body region, initial encounter: Secondary | ICD-10-CM | POA: Diagnosis not present

## 2018-09-08 DIAGNOSIS — S93422A Sprain of deltoid ligament of left ankle, initial encounter: Secondary | ICD-10-CM | POA: Diagnosis not present

## 2018-09-08 NOTE — Telephone Encounter (Signed)
I called [facility] and she states it is available for faxing as a final.

## 2018-09-08 NOTE — Telephone Encounter (Signed)
-----   Message from Rip Harbour, [facility] sent at 09/08/2018  1:58 PM EDT ----- Regarding: Looking for MRI overread Dr. Milinda Pointer said this was sent for overread and he's wanting to know if we have the report yet

## 2018-09-09 ENCOUNTER — Encounter: Payer: Self-pay | Admitting: Podiatry

## 2018-09-09 ENCOUNTER — Telehealth: Payer: Self-pay | Admitting: *Deleted

## 2018-09-09 NOTE — Telephone Encounter (Signed)
Left message informing pt of Dr. Stephenie Acres review of the overread results and orders.

## 2018-09-09 NOTE — Telephone Encounter (Signed)
Pt asked where the fracture was because she is going to therapy and didn't want to have any problems. I reviewed the [facility] report and informed pt the stress fracture was at the 2nd metatarsal head.

## 2018-09-09 NOTE — Telephone Encounter (Signed)
I informed pt of Dr. Stephenie Acres review of overread and orders.

## 2018-09-09 NOTE — Telephone Encounter (Signed)
-----   Message from Garrel Ridgel, [place] sent at 09/08/2018  5:14 PM EDT ----- Regarding: MRI results Could you please give her a call and inform her that the radiologist read the mri as a stress fracture.  Very important to stay in boot or shoe until next visit when we will do an xray.

## 2018-09-09 NOTE — Progress Notes (Signed)
She presents today for follow-up of bony contusion left states that really is about the same though I cannot say wearing the boot seems to have made it better.  She does notice that she has more of a dorsiflexion at the third DIPJ medial deviation of the second toe at the level of the metatarsal phalangeal joint.  She states that Dr. Lucia Gaskins has encouraged her to get back to physical therapy because of the ankle surgery.  She is complaining of complete numbness from the lateral port of the surgical ankle to the tip of the hallux medially.  She states that she has some tingling but for the most part it is numb.  She also has some numbness around the top of the foot and she points to the first intermetatarsal space.  States that it still hurts to move the toe second left.  Objective: Vital signs are stable alert and oriented x3.  Pulses are palpable.  Neurologic sensorium is diminished to the medial dorsal cutaneous nerve and deep peroneal nerve innervating the first intermetatarsal space.  The sensation is primarily altered.  She has good subtalar joint ankle joint range of motion still has swelling with some mild erythema and hyperpigmentation over the second metatarsal phalangeal joint.  I had sent the MRI out for an overread which came back today after the patient has left.  It did indicate with this radiologist thought to be a subcortical stress fracture or Freiberg's infraction.  Assessment probable stress fracture subcortical nature left second metatarsal head.  Plan: Patient will continue to wear CAM Walker I will follow-up with her in 3 to 4 weeks for another set of x-rays fully weightbearing.

## 2018-09-09 NOTE — Telephone Encounter (Signed)
I informed pt of Dr. Stephenie Acres review of overread results and orders.

## 2018-09-10 ENCOUNTER — Telehealth: Payer: Self-pay | Admitting: Podiatry

## 2018-09-10 NOTE — Telephone Encounter (Signed)
I would like my ov note from 25 August and MRI over read released to me via MyChart. I have already signed the medical records release form. If you have any questions, you can give me a call at [phone].

## 2018-09-28 ENCOUNTER — Telehealth: Payer: Self-pay | Admitting: *Deleted

## 2018-09-28 NOTE — Telephone Encounter (Signed)
I called pt and offered her 9:45am appt with Dr. Milinda Pointer and she accepted.

## 2018-09-28 NOTE — Telephone Encounter (Signed)
Pt states she is having pain and swelling in the right 2nd metatarsal head, but now is having more pain in right lateral foot and wanted to get in earlier for a x-ray, because may be going out of town after Thursday.

## 2018-09-29 ENCOUNTER — Ambulatory Visit (INDEPENDENT_AMBULATORY_CARE_PROVIDER_SITE_OTHER)

## 2018-09-29 ENCOUNTER — Encounter: Payer: Self-pay | Admitting: Podiatry

## 2018-09-29 ENCOUNTER — Ambulatory Visit (INDEPENDENT_AMBULATORY_CARE_PROVIDER_SITE_OTHER): Payer: BC Managed Care – PPO

## 2018-09-29 ENCOUNTER — Ambulatory Visit: Payer: BC Managed Care – PPO | Admitting: Podiatry

## 2018-09-29 ENCOUNTER — Other Ambulatory Visit: Payer: Self-pay

## 2018-09-29 ENCOUNTER — Ambulatory Visit (INDEPENDENT_AMBULATORY_CARE_PROVIDER_SITE_OTHER): Admitting: Podiatry

## 2018-09-29 DIAGNOSIS — M84375D Stress fracture, left foot, subsequent encounter for fracture with routine healing: Secondary | ICD-10-CM

## 2018-09-29 DIAGNOSIS — S93422A Sprain of deltoid ligament of left ankle, initial encounter: Secondary | ICD-10-CM

## 2018-09-29 DIAGNOSIS — I472 Ventricular tachycardia: Secondary | ICD-10-CM

## 2018-09-29 DIAGNOSIS — I4729 Other ventricular tachycardia: Secondary | ICD-10-CM

## 2018-09-29 MED ORDER — METHYLPREDNISOLONE 4 MG PO TBPK: ORAL_TABLET | ORAL | 0 refills | Status: DC

## 2018-09-29 NOTE — Progress Notes (Signed)
She presents today complaining of pain to the lateral aspect of the left foot and states that is still hurting and swelling some on the dorsal aspect overlying the second and third toes.  She states that she tries to wear the cam walker most of the time.  Objective: Vital signs are stable alert and oriented x3.  Pulses are palpable.  She has mild swelling overlying the second and third metatarsal phalangeal joints with pain on range of motion of those toes.  She also has exquisite pain on palpation of the these bones as well as the intermetatarsal spaces.  Assessment: Capsulitis third metatarsal phalangeal joint and second metatarsal phalangeal joint with Freiberg's infraction second metatarsal.  Plan: Offered her an injection today she declined but she did request a Medrol Dosepak.  Another MRI may be necessary in the near future.

## 2018-10-01 ENCOUNTER — Ambulatory Visit: Payer: BC Managed Care – PPO | Admitting: Podiatry

## 2018-10-10 DIAGNOSIS — I472 Ventricular tachycardia: Secondary | ICD-10-CM | POA: Diagnosis not present

## 2018-10-12 ENCOUNTER — Ambulatory Visit: Payer: BC Managed Care – PPO | Admitting: Adult Health

## 2018-10-13 ENCOUNTER — Other Ambulatory Visit: Payer: Self-pay

## 2018-10-13 DIAGNOSIS — F4323 Adjustment disorder with mixed anxiety and depressed mood: Secondary | ICD-10-CM | POA: Diagnosis not present

## 2018-10-20 ENCOUNTER — Other Ambulatory Visit: Payer: Self-pay

## 2018-10-20 ENCOUNTER — Encounter: Payer: Self-pay | Admitting: Podiatry

## 2018-10-20 ENCOUNTER — Telehealth: Payer: Self-pay | Admitting: *Deleted

## 2018-10-20 ENCOUNTER — Ambulatory Visit (INDEPENDENT_AMBULATORY_CARE_PROVIDER_SITE_OTHER): Admitting: Podiatry

## 2018-10-20 DIAGNOSIS — M84375D Stress fracture, left foot, subsequent encounter for fracture with routine healing: Secondary | ICD-10-CM | POA: Diagnosis not present

## 2018-10-20 DIAGNOSIS — T148XXA Other injury of unspecified body region, initial encounter: Secondary | ICD-10-CM

## 2018-10-20 NOTE — Telephone Encounter (Signed)
Orders to L. Cox, CMA for pre-cert, faxed to [place].

## 2018-10-20 NOTE — Telephone Encounter (Signed)
-----  Message from Rip Harbour, [facility] sent at 10/20/2018  1:39 PM EDT ----- Regarding: MRI MRI left foot - evaluate freiberg infraction vs avascular necrosis 2nd met left

## 2018-10-21 ENCOUNTER — Encounter: Payer: Self-pay | Admitting: Podiatry

## 2018-10-21 NOTE — Progress Notes (Signed)
She presents today for follow-up of her left foot pain states that I am frustrated with this foot I have had to go for a second opinion which is now my third or fourth opinion because my insurance wanting me to do this.  She saw Dr. Debby Bud at [facility].  She states that they were very mean and very unprofessional and she will not be going back there.  She said that he remarked with the same diagnoses that our office had initially stated.  She goes today for her nerve conduction study regarding the numbness and pain to the left forefoot.  Objective: Vital signs are stable she is alert and oriented x3.  Pulses are palpable left mild edema left still has severe pain on palpation of the second and third metatarsal phalangeal joints.  Assessment: Most likely osteochondrosis or avascular necrosis of the second metatarsal head left.  Plan: At this point I would like to go ahead and have another MRI done as a surveillance MRI for evaluation of that second metatarsal phalangeal joint.  I will follow-up with her once that comes in.

## 2018-10-23 ENCOUNTER — Ambulatory Visit (INDEPENDENT_AMBULATORY_CARE_PROVIDER_SITE_OTHER): Payer: BC Managed Care – PPO | Admitting: Internal Medicine

## 2018-10-23 ENCOUNTER — Encounter: Payer: Self-pay | Admitting: Internal Medicine

## 2018-10-23 ENCOUNTER — Other Ambulatory Visit: Payer: Self-pay

## 2018-10-23 VITALS — BP 101/70 | HR 68 | Ht 63.0 in | Wt 150.6 lb

## 2018-10-23 DIAGNOSIS — I498 Other specified cardiac arrhythmias: Secondary | ICD-10-CM | POA: Diagnosis not present

## 2018-10-23 DIAGNOSIS — G90A Postural orthostatic tachycardia syndrome (POTS): Secondary | ICD-10-CM

## 2018-10-23 DIAGNOSIS — I472 Ventricular tachycardia: Secondary | ICD-10-CM | POA: Diagnosis not present

## 2018-10-23 DIAGNOSIS — I4729 Other ventricular tachycardia: Secondary | ICD-10-CM

## 2018-10-23 MED ORDER — MECLIZINE HCL 25 MG PO TABS: ORAL_TABLET | ORAL | 3 refills | Status: DC

## 2018-10-23 NOTE — Progress Notes (Signed)
Patient Care Team: [facility], Modena Nunnery, MD as PCP - General (Family Medicine) Sherren Mocha, MD as PCP - Cardiology (Cardiology) Deboraha Sprang, MD as PCP - Electrophysiology (Cardiology) Danie Binder, MD as Consulting Physician (Gastroenterology)   HPI  Candice Estrada is a 43 y.o. female Are seen in followup for syncope in the context of a prior ASD closure  She continues to complain of palpitations which are becoming increasingly frequent. They're quite frightening at times. They're relatively brief lasting only seconds and hence has not been amenable to the Alive[employer] monitor. Review of her old chart demonstrates nonsustained ventricular tachycardia on her ZIO patch. In her mind these are similar.  These are increasingly frequent, and most recently a colleague noted pallor, and HR 120s with BP 80s  Echocardiogram was normal. signal average ECG was normal  She also has heat intolerance shower intolerance and worsening symptoms at the time of her period. However, she is not a candidate for hormonal therapy because of her prior stroke.  She has 2 distinct dizzy syndromes.  One is associated with a generalized sense of motion of the environment.  The other is more vertiginous.  She has seen Dr. 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  She did not return because of her concerns of surgery.  fQRS HFLA <40 RMS40  96 26 56      Past Medical History:  Diagnosis Date  . Anxiety   . ASD (atrial septal defect), ostium secundum    Status post closure 08/20/10 with Dr. Burt Knack; procedure complicated by right groin hematoma and acute blood loss anemia  . BV (bacterial vaginosis) 10/06/2014  . Complication of anesthesia    VASO-VAGAL RESPONSE IN RECOVERY ROOM AFTER HEART SURGERY  . Hematuria   . History of psoriasis    scalp  . History of TIAs   . Hx of dizziness    random episodes "because my BP is so low"  .  Hypotension   . IUD (intrauterine device) in place 12/16/2012  . Kidney stone   . Meniere's disease   . Night terrors   . NSVT (nonsustained ventricular tachycardia) (HCC)    a. 4 beat run on Zio 2015.  Marland Kitchen Ocular neoplasm   . Ovarian cyst   . POTS (postural orthostatic tachycardia syndrome)   . SBO (small bowel obstruction) ([place])   . 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 Thyroid enlarged 12/16/2012   History of cyst will get [place]    Past Surgical History:  Procedure Laterality Date  . ANKLE ARTHROSCOPY WITH RECONSTRUCTION Left 02/19/2018   Procedure: LEFT ANKLE ARTHROSCOPIC DEBRIDEMENT, LATERAL LIGAMENT RECONSTRUCTION,  OSTEOCHONDRAL TREATMENT;  Surgeon: Erle Crocker, MD;  Location: [facility];  Service: Orthopedics;  Laterality: Left;  . ASD REPAIR  08/20/2010  . CARDIAC SURGERY    . CYSTOSCOPY WITH RETROGRADE PYELOGRAM, URETEROSCOPY AND STENT PLACEMENT Left 08/31/2014   Procedure: CYSTOSCOPY WITH BILATERAL RETROGRADE PYELOGRAM, URETEROSCOPY AND STENT PLACEMENT, STONE EXTRACTION WITH BASKET;  Surgeon: Cleon Gustin, MD;  Location[facility];  Service: Urology;  Laterality: Left;  . FOOT NEUROMA SURGERY  2009   left  . kidney stone removed    . THYROID CYST EXCISION    . TONSILLECTOMY    . vascular necrosis left foot      Current Outpatient Medications  Medication Sig Dispense Refill  . buPROPion [facility] SR)  150 MG 12 hr tablet TAKE 1 TABLET DAILY 90 tablet 3  . ibuprofen (ADVIL,MOTRIN) 800 MG tablet Take 1 tablet (800 mg total) by mouth every 8 (eight) hours as needed. 90 tablet 4  . meclizine (ANTIVERT) 25 MG tablet Take 1/2 to 1 tablet by mouth twice a day as needed for vertigo 28 tablet 0  . Melatonin 3 MG TABS Take 1 tablet by mouth daily.    . meloxicam (MOBIC) 15 MG tablet Take 1 tablet (15 mg total) by mouth daily. 30 tablet 3  . Multiple Vitamin (MULTIVITAMIN WITH MINERALS) TABS Take 1 tablet by mouth every morning.     Marland Kitchen PARAGARD  INTRAUTERINE COPPER IU by Intrauterine route.     No current facility-administered medications for this visit.     No Known Allergies  Review of Systems negative except from HPI and PMH  Physical Exam BP 101/70   Pulse 68   Ht 5' 3"  (1.6 m)   Wt 150 lb 9.6 oz (68.3 kg)   SpO2 98%   BMI 26.68 kg/m  Well developed and nourished in no acute distress HENT normal Neck supple with JVP-  flat   Clear Regular rate and rhythm, no murmurs or gallops Abd-soft with active BS No Clubbing cyanosis edema Skin-warm and dry A & Oriented  Grossly normal sensory and motor function  ECG * NSR @ 68   Assessment and  Plan  VT NS  Autonomic insufficiency  Lightheadedness   Ocular neoplasm "surface squamous neoplasia" ASD repair AS Amplatzer  POTS ? Vertigo     Her event recorder was reviewed.  Her heart rate went into the 130s with exercise albeit just walking.  Reviewing her dizzy syndromes again, I have encouraged her to follow-up with Dr. Freddy Jaksch in [place]  We reviewed her MRI report.  I sent a message to Dr. Burt Knack to ask him to look at it as there is some residual ASD shunting  We spent more than 50% of our >25 min visit in face to face counseling regarding the above

## 2018-10-23 NOTE — Patient Instructions (Signed)
Medication Instructions:  Your physician recommends that you continue on your current medications as directed. Please refer to the Current Medication list given to you today.  Labwork: None ordered.  Testing/Procedures: None ordered.  Follow-Up: Your physician recommends that you schedule a follow-up appointment in:   6 months with Dr. Caryl Comes  Any Other Special Instructions Will Be Listed Below (If Applicable).     If you need a refill on your cardiac medications before your next appointment, please call your pharmacy.

## 2018-10-26 ENCOUNTER — Telehealth: Payer: Self-pay | Admitting: *Deleted

## 2018-10-26 NOTE — Telephone Encounter (Signed)
-----   Message from Sandre Kitty sent at 10/26/2018  8:44 AM EDT ----- Regarding: Workers comp Good morning.     We have this patient listed as workers comp for her MRI that is scheduled on Wednesday; however, the adjuster said they have not received any orders for this an can't approve the exam at this time.    Should this be billed to workers comp or her medical insurance?   [name]

## 2018-10-26 NOTE — Telephone Encounter (Signed)
I called pt to find out if was to be filed as worker's comp and pt states she has avascular necrosis from ankle surgery and, depending on the diagnosis she would like to have filed on [employer] and if not covered will handle it. I faxed to [facility] main scheduling radiology and gave pt [phone] to call to schedule.

## 2018-10-26 NOTE — Telephone Encounter (Signed)
Manuela Schwartz, CMA states pre-certed to [facility] in network.

## 2018-10-26 NOTE — Telephone Encounter (Signed)
Called and spoke with Jazmine C with [employer] on Friday October 23, 2018 and the representative stated that the procedure code [id number] would be out of network if patient went to [facility] and would cover at [facility] and the authorization number is [id number] and is valid 10/22/2018 thru 04/19/2019 and I also called the patient as well and explained what [employer] said and patient was ok with going to [place]

## 2018-10-26 NOTE — Addendum Note (Signed)
Addended by: Harriett Sine D on: 10/26/2018 11:04 AM   Modules accepted: Orders

## 2018-10-27 ENCOUNTER — Telehealth: Payer: Self-pay | Admitting: *Deleted

## 2018-10-27 NOTE — Telephone Encounter (Signed)
-----   Message from Sandre Kitty sent at 10/26/2018  8:44 AM EDT ----- Regarding: Workers comp Good morning.     We have this patient listed as workers comp for her MRI that is scheduled on Wednesday; however, the adjuster said they have not received any orders for this an can't approve the exam at this time.    Should this be billed to workers comp or her medical insurance?   [name]

## 2018-10-28 ENCOUNTER — Other Ambulatory Visit: Payer: BC Managed Care – PPO

## 2018-10-30 ENCOUNTER — Other Ambulatory Visit: Payer: Self-pay

## 2018-10-30 ENCOUNTER — Ambulatory Visit (HOSPITAL_COMMUNITY)
Admission: RE | Admit: 2018-10-30 | Discharge: 2018-10-30 | Disposition: A | Discharge status: Home / Self Care | Payer: BC Managed Care – PPO | Source: Ambulatory Visit | Attending: Podiatry | Admitting: Podiatry

## 2018-10-30 ENCOUNTER — Telehealth: Payer: Self-pay | Admitting: *Deleted

## 2018-10-30 DIAGNOSIS — M84375D Stress fracture, left foot, subsequent encounter for fracture with routine healing: Secondary | ICD-10-CM | POA: Insufficient documentation

## 2018-10-30 DIAGNOSIS — M7989 Other specified soft tissue disorders: Secondary | ICD-10-CM | POA: Diagnosis not present

## 2018-10-30 DIAGNOSIS — T148XXA Other injury of unspecified body region, initial encounter: Secondary | ICD-10-CM | POA: Insufficient documentation

## 2018-10-30 NOTE — Telephone Encounter (Signed)
I informed pt the results were available and I had message Dr. Milinda Pointer.

## 2018-10-30 NOTE — Telephone Encounter (Signed)
Pt states she just had her MRI and wanted to know if we had the results.

## 2018-11-02 NOTE — Telephone Encounter (Signed)
Pt calling for MRI results, to check on change in restrictions.

## 2018-11-02 NOTE — Telephone Encounter (Signed)
Patient still has a fracture at metatarsal. Continue with restrictions as recommended by Dr. Milinda Pointer. She needs to make a follow up appointment with Dr. Milinda Pointer to discuss plan of care -Dr Chauncey Cruel

## 2018-11-02 NOTE — Telephone Encounter (Signed)
I informed pt of Dr. Leeanne Rio review of results and recommendations and transferred to schedulers.

## 2018-11-10 ENCOUNTER — Telehealth: Payer: Self-pay | Admitting: *Deleted

## 2018-11-10 NOTE — Telephone Encounter (Signed)
Left message informing pt of Dr. Stephenie Acres request to send copy of MRI disc to a radiology specialist for more details for treatment planning, there would be a 10-14 day delay in the final results. Faxed orders for MRI disc copy to [facility].

## 2018-11-10 NOTE — Telephone Encounter (Signed)
I spoke with Candice Estrada and requested copy of MRI disc printed from the MRI machine in [facility] to be mailed to our office. Manuela Schwartz states she will mail to our office today.

## 2018-11-10 NOTE — Telephone Encounter (Signed)
-----   Message from Garrel Ridgel, [place] sent at 11/10/2018  7:32 AM EDT ----- Let her know that I would like to send it for an over read.  It does look like osteochondrosis/freibergs infraction. Most likely surgery.

## 2018-11-11 NOTE — Telephone Encounter (Signed)
Received MRI disc copy from [facility]. Mailed to [employer].

## 2018-11-23 ENCOUNTER — Telehealth: Payer: Self-pay | Admitting: Podiatry

## 2018-11-23 NOTE — Telephone Encounter (Signed)
Candice Estrada called asking if her re-read had come back and what her next steps are. Also she wanted to know if she should continue using the Bone Stimulator?

## 2018-11-24 NOTE — Telephone Encounter (Signed)
I informed pt the over read was in the final process and once signed by the radiologist it would be sent to our office and I would inform Dr. Milinda Pointer and call with instructions. I told pt to continue with the bone growth stimulator.

## 2018-11-24 NOTE — Telephone Encounter (Signed)
Charlie - [facility] states pt's report is to be signed by Dr. Tamala Julian then will be faxed.

## 2018-11-26 ENCOUNTER — Encounter: Payer: Self-pay | Admitting: Podiatry

## 2018-11-26 NOTE — Telephone Encounter (Signed)
Gabriel Cirri - [facility] states Dr. Tamala Julian has run out of the office, but she will ask him to sign the report once he returns and fax to [phone].

## 2018-11-30 NOTE — Telephone Encounter (Signed)
Pt called to see if results were available.i called and I informed we had received the results and would call again once Dr. Milinda Pointer had reviewed.

## 2018-12-01 ENCOUNTER — Telehealth: Payer: Self-pay | Admitting: *Deleted

## 2018-12-01 ENCOUNTER — Ambulatory Visit: Payer: BC Managed Care – PPO | Admitting: Podiatry

## 2018-12-01 NOTE — Telephone Encounter (Signed)
I informed pt of Dr. Stephenie Acres review of overread results and offered to have schedulers call to set up appt. Pt agreed and also asked to send a message to the records coordinator to call her also.

## 2018-12-01 NOTE — Telephone Encounter (Signed)
Osteochondrosis.  Basically no change.  She can come in at some point to discuss surgery.

## 2018-12-01 NOTE — Telephone Encounter (Signed)
Patient's over read MRI was sent to her MyChart.

## 2018-12-01 NOTE — Telephone Encounter (Addendum)
-----   Message from Garrel Ridgel, [place] sent at 12/01/2018  7:10 AM EST ----- Sent response.  Osteochondrosis and will need surgry. Results given to pt through Telephone Call.

## 2018-12-29 ENCOUNTER — Ambulatory Visit: Payer: BC Managed Care – PPO | Admitting: Podiatry

## 2019-02-04 DIAGNOSIS — N3000 Acute cystitis without hematuria: Secondary | ICD-10-CM | POA: Diagnosis not present

## 2019-02-08 DIAGNOSIS — K219 Gastro-esophageal reflux disease without esophagitis: Secondary | ICD-10-CM | POA: Diagnosis not present

## 2019-02-08 DIAGNOSIS — K5900 Constipation, unspecified: Secondary | ICD-10-CM | POA: Diagnosis not present

## 2019-03-15 DIAGNOSIS — Z9289 Personal history of other medical treatment: Secondary | ICD-10-CM

## 2019-03-15 HISTORY — DX: Personal history of other medical treatment: Z92.89

## 2019-03-22 NOTE — Progress Notes (Signed)
Cardiology Office Note:    Date:  03/23/2019   ID:  Candice Estrada, DOB [birth year], MRN [id number]  PCP:  Alycia Rossetti, MD  Cardiologist:  Sherren Mocha, MD   Electrophysiologist:  Virl Axe, MD   Referring MD: Alycia Rossetti, MD   Chief Complaint:  Follow-up (hx of ASD closure) and Chest Pain    Patient Profile:    Candice Estrada is a 44 y.o. female with:   Ostium secundum ASD  Status post TIA  Status post closure in 2012  cMRI 03/2018: EF 64; ASD closure with residual L-R shunting inRA  Echocardiogram w/ Bubble: no R-L shunt  POTS  NSVT  SAECG normal (03/20/2018)  Monitor 2015: PACs, 4 beats NSVT  Monitor 12/17: PACs  Dizziness (POTS vs Vertigo)  No significant response to Florinef, Midodrine, beta-blocker   Hx of semi-circular canal dehiscence   Migraine HAs   B12 deficiency   Ocular surface squamous cell neoplasia  Small bowel obstruction 6/19  Prior CV studies: Cardiac monitor 10/13/2018 No ventricular ectopy Occasional PAC One triggered event ( did she remember that?) assoc with normal sinus rhythm       Echo with bubble study 06/18/2018 EF 60-65, normal RV SF, well-seated ASD closure device, bubble study negative for right-to-left flow at rest and with cough provocation (study reviewed by Dr. Emilia Beck septum appears to be intact)  Cardiac MRI 03/20/2018 Normal RV size and function, no RV dysplasia EF 64 No LGE ASD closure device with residual L-R shunt in RA  SAECG 03/2018 Normal   Cardiac monitor 02/12/2018 NSVT  Stress echo 02/12/2018 Normal stress echo; hypertensive response (systolic 638 mmHg at rest >>dropping to 81 mmHg at peak) -hypotension felt to be related to autonomic insufficiency  Echocardiogram 02/03/2018 EF 60-65, normal wall motion, normal diastolic function, mild TR  Cardiac monitor 12/22/2015 Isolated PACs and one ventricular couplet   History of Present Illness:    [name] was last seen by Dr.  Caryl Comes in 10/2018.    She returns for follow up.  She notes she is here to review the results of her cMRI.  I reviewed her MRI results from 03/2018 as well as her Echocardiogram w/ Bubble Study done in 06/2018.  It was noted that Dr. Burt Knack reviewed both of these and her Echocardiogram with Bubble Study demonstrated that the atrial septum was completely intact.  We also reviewed her stress echocardiogram from last year.  Overall, she has been doing well.  She continues to have dizziness.  She has seen ENT.  She is not interested in surgery to correct semicircular canal dehiscence.  She has had occasional chest discomfort.  This can occur at rest or with exertion.  She has noted for the past several months.  She thinks it is related to acid reflux.  She has tried multiple medications without significant improvement in her acid reflux and she is followed by gastroenterology.  She has not had significant shortness of breath.  She has not had orthopnea.  She does have some mild pedal edema.  She also notes hand swelling in the mornings.  She does have a significant family history of coronary artery disease.  Her father had a myocardial infarction in his 16s.  Past Medical History:  Diagnosis Date  . Anxiety   . ASD (atrial septal defect), ostium secundum    Status post closure 08/20/10 with Dr. Burt Knack; procedure complicated by right groin hematoma and acute blood loss anemia  . BV (bacterial  vaginosis) 10/06/2014  . Complication of anesthesia    VASO-VAGAL RESPONSE IN RECOVERY ROOM AFTER HEART SURGERY  . Hematuria   . History of psoriasis    scalp  . History of TIAs   . Hx of dizziness    random episodes "because my BP is so low"  . Hypotension   . IUD (intrauterine device) in place 12/16/2012  . Kidney stone   . Meniere's disease   . Night terrors   . NSVT (nonsustained ventricular tachycardia) (HCC)    a. 4 beat run on Zio 2015.  Marland Kitchen Ocular neoplasm   . Ovarian cyst   . POTS (postural orthostatic  tachycardia syndrome)   . SBO (small bowel obstruction) ([facility])   . 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 Thyroid enlarged 12/16/2012   History of cyst will get [place]    Current Medications: Current Meds  Medication Sig  . buPROPion (WELLBUTRIN SR) 150 MG 12 hr tablet TAKE 1 TABLET DAILY  . ibuprofen (ADVIL,MOTRIN) 800 MG tablet Take 1 tablet (800 mg total) by mouth every 8 (eight) hours as needed.  . meclizine (ANTIVERT) 25 MG tablet 1 tablet by mouth twice a day as needed for vertigo  . Melatonin 3 MG TABS Take 1 tablet by mouth daily.  . Multiple Vitamin (MULTIVITAMIN WITH MINERALS) TABS Take 1 tablet by mouth every morning.   . pantoprazole (PROTONIX) 40 MG tablet Take 40 mg by mouth daily.  Marland Kitchen PARAGARD INTRAUTERINE COPPER IU by Intrauterine route.  . [DISCONTINUED] meloxicam (MOBIC) 15 MG tablet Take 1 tablet (15 mg total) by mouth daily.     Allergies:   Patient has no known allergies.   Social History   Tobacco Use  . Smoking status: Never Smoker  . Smokeless tobacco: Never Used  Substance Use Topics  . Alcohol use: Yes    Alcohol/week: 0.0 standard drinks    Comment: very rarely  . Drug use: No     Family Hx: The patient's family history includes Brain cancer in her father; Cancer in her maternal grandmother, mother, paternal aunt, paternal grandmother, and another family member; Dementia in her maternal grandmother; Diabetes in her father; Heart attack in her father; Heart disease in her father; Hypertension (age of onset: 42) in her brother; Hypertension (age of onset: 73) in her father; Kidney failure in her daughter; Liver disease (age of onset: 4) in her mother; Stroke in her father.  ROS   EKGs/Labs/Other Test Reviewed:    EKG:  EKG is   ordered today.  The ekg ordered today demonstrates normal sinus rhythm, heart rate 77, normal axis, no ST-T wave changes, QTC 439, no change since last EKG  Recent Labs: No results found for requested labs within  last 8760 hours.   Recent Lipid Panel Lab Results  Component Value Date/Time   CHOL 158 10/21/2016 10:10 AM   TRIG 63 10/21/2016 10:10 AM   HDL 59 10/21/2016 10:10 AM   CHOLHDL 2.7 10/21/2016 10:10 AM   CHOLHDL 2.8 09/09/2012 09:10 AM   LDLCALC 86 10/21/2016 10:10 AM     Physical Exam:    VS:  BP 98/70   Pulse 77   Ht 5' 3"  (1.6 m)   Wt 154 lb (69.9 kg)   SpO2 100%   BMI 27.28 kg/m     Wt Readings from Last 3 Encounters:  03/23/19 154 lb (69.9 kg)  10/23/18 150 lb 9.6 oz (68.3 kg)  08/10/18 153 lb (69.4 kg)  Constitutional:      Appearance: Healthy appearance. Not in distress.  Neck:     Vascular: JVD normal.  Pulmonary:     Breath sounds: No wheezing. No rales.  Cardiovascular:     Normal rate. Regular rhythm. Normal S1. Normal S2.     Murmurs: There is no murmur.  Edema:    Peripheral edema absent.  Abdominal:     Palpations: Abdomen is soft. There is no hepatomegaly.  Skin:    General: Skin is warm and dry.  Neurological:     General: No focal deficit present.     Mental Status: Alert and oriented to person, place and time.      ASSESSMENT & PLAN:    1. Precordial chest pain 2. Family history of premature coronary artery disease Her chest discomfort seems to be atypical for ischemia.  I suspect it is related to acid reflux.  She does have a very significant family history of coronary artery disease.  She lacks other risk factors.  We discussed the pros and cons of proceeding with cardiac calcium scoring.  A calcium score of 0 would be reassuring.  However, if this is elevated, she may want to think about aspirin therapy or statin therapy.  If her calcium score is abnormal and she continues to have chest symptoms, proceed with Lexiscan Myoview.  3. Atrial septal defect, secundum 4. Status post device closure of ASD As noted, her echocardiogram with bubble study last year demonstrated a well-seated closure device.  Follow-up with Dr. Burt Knack in 1  year.  5. Dizziness This seems to mainly be related to semicircular canal dehiscence.  As noted, she has seen ENT and is not interested in surgery.  6. NSVT (nonsustained ventricular tachycardia) (HCC) Previous cardiac MRI and signal-averaged ECG normal.   Dispo:  Return in about 1 year (around 03/22/2020) for Routine Follow Up, w/ Dr. Burt Knack, in person.   Medication Adjustments/Labs and Tests Ordered: Current medicines are reviewed at length with the patient today.  Concerns regarding medicines are outlined above.  Tests Ordered: Orders Placed This Encounter  Procedures  . CT CARDIAC SCORING  . EKG 12-Lead   Medication Changes: No orders of the defined types were placed in this encounter.   Signed, Richardson Dopp, PA-C  03/23/2019 9:46 AM    [facility] [place], [place], [place]  [address] Phone: [phone]; Fax: [phone]

## 2019-03-23 ENCOUNTER — Encounter: Payer: Self-pay | Admitting: Physician Assistant

## 2019-03-23 ENCOUNTER — Ambulatory Visit (INDEPENDENT_AMBULATORY_CARE_PROVIDER_SITE_OTHER): Payer: BC Managed Care – PPO | Admitting: Physician Assistant

## 2019-03-23 ENCOUNTER — Other Ambulatory Visit: Payer: Self-pay

## 2019-03-23 VITALS — BP 98/70 | HR 77 | Ht 63.0 in | Wt 154.0 lb

## 2019-03-23 DIAGNOSIS — R072 Precordial pain: Secondary | ICD-10-CM | POA: Diagnosis not present

## 2019-03-23 DIAGNOSIS — Q2111 Secundum atrial septal defect: Secondary | ICD-10-CM

## 2019-03-23 DIAGNOSIS — Q211 Atrial septal defect: Secondary | ICD-10-CM

## 2019-03-23 DIAGNOSIS — I472 Ventricular tachycardia: Secondary | ICD-10-CM

## 2019-03-23 DIAGNOSIS — Z8774 Personal history of (corrected) congenital malformations of heart and circulatory system: Secondary | ICD-10-CM

## 2019-03-23 DIAGNOSIS — Z8249 Family history of ischemic heart disease and other diseases of the circulatory system: Secondary | ICD-10-CM | POA: Diagnosis not present

## 2019-03-23 DIAGNOSIS — I4729 Other ventricular tachycardia: Secondary | ICD-10-CM

## 2019-03-23 DIAGNOSIS — R42 Dizziness and giddiness: Secondary | ICD-10-CM

## 2019-03-23 NOTE — Patient Instructions (Addendum)
Medication Instructions:   Your physician recommends that you continue on your current medications as directed. Please refer to the Current Medication list given to you today.  *If you need a refill on your cardiac medications before your next appointment, please call your pharmacy*  Lab Work:  None ordered today  If you have labs (blood work) drawn today and your tests are completely normal, you will receive your results only by: Marland Kitchen MyChart Message (if you have MyChart) OR . A paper copy in the mail If you have any lab test that is abnormal or we need to change your treatment, we will call you to review the results.  Testing/Procedures:  Your provider wants you to have a cardiac calcium score CT.  Follow-Up: At [facility], you and your health needs are our priority.  As part of our continuing mission to provide you with exceptional heart care, we have created designated Provider Care Teams.  These Care Teams include your primary Cardiologist (physician) and Advanced Practice Providers (APPs -  Physician Assistants and Nurse Practitioners) who all work together to provide you with the care you need, when you need it.  We recommend signing up for the patient portal called "MyChart".  Sign up information is provided on this After Visit Summary.  MyChart is used to connect with patients for Virtual Visits (Telemedicine).  Patients are able to view lab/test results, encounter notes, upcoming appointments, etc.  Non-urgent messages can be sent to your provider as well.   To learn more about what you can do with MyChart, go to [email or web address].    Your next appointment:   12 month(s)  The format for your next appointment:   In Person  Provider:   Sherren Mocha, MD

## 2019-03-31 ENCOUNTER — Other Ambulatory Visit: Payer: Self-pay

## 2019-03-31 ENCOUNTER — Ambulatory Visit (INDEPENDENT_AMBULATORY_CARE_PROVIDER_SITE_OTHER)
Admission: RE | Admit: 2019-03-31 | Discharge: 2019-03-31 | Disposition: A | Discharge status: Home / Self Care | Payer: Self-pay | Source: Ambulatory Visit | Attending: Physician Assistant | Admitting: Physician Assistant

## 2019-03-31 DIAGNOSIS — R072 Precordial pain: Secondary | ICD-10-CM

## 2019-04-01 ENCOUNTER — Encounter: Payer: Self-pay | Admitting: Physician Assistant

## 2019-04-08 DIAGNOSIS — Q211 Atrial septal defect: Secondary | ICD-10-CM | POA: Diagnosis not present

## 2019-04-08 DIAGNOSIS — R002 Palpitations: Secondary | ICD-10-CM | POA: Diagnosis not present

## 2019-04-08 DIAGNOSIS — I951 Orthostatic hypotension: Secondary | ICD-10-CM | POA: Diagnosis not present

## 2019-04-08 DIAGNOSIS — R55 Syncope and collapse: Secondary | ICD-10-CM | POA: Diagnosis not present

## 2019-05-04 ENCOUNTER — Encounter: Payer: Self-pay | Admitting: Podiatry

## 2019-05-04 ENCOUNTER — Other Ambulatory Visit: Payer: Self-pay

## 2019-05-04 ENCOUNTER — Ambulatory Visit (INDEPENDENT_AMBULATORY_CARE_PROVIDER_SITE_OTHER): Admitting: Podiatry

## 2019-05-04 ENCOUNTER — Ambulatory Visit (INDEPENDENT_AMBULATORY_CARE_PROVIDER_SITE_OTHER)

## 2019-05-04 DIAGNOSIS — M21962 Unspecified acquired deformity of left lower leg: Secondary | ICD-10-CM | POA: Diagnosis not present

## 2019-05-04 DIAGNOSIS — M9272 Juvenile osteochondrosis of metatarsus, left foot: Secondary | ICD-10-CM | POA: Diagnosis not present

## 2019-05-04 NOTE — Patient Instructions (Signed)
Pre-Operative Instructions  Congratulations, you have decided to take an important step towards improving your quality of life.  You can be assured that the doctors and staff at [facility] will be with you every step of the way.  Here are some important things you should know:  1. Plan to be at the surgery center/hospital at least 1 (one) hour prior to your scheduled time, unless otherwise directed by the surgical center/hospital staff.  You must have a responsible adult accompany you, remain during the surgery and drive you home.  Make sure you have directions to the surgical center/hospital to ensure you arrive on time. 2. If you are having surgery at [facility] or [facility], you will need a copy of your medical history and physical form from your family physician within one month prior to the date of surgery. We will give you a form for your primary physician to complete.  3. We make every effort to accommodate the date you request for surgery.  However, there are times where surgery dates or times have to be moved.  We will contact you as soon as possible if a change in schedule is required.   4. No aspirin/ibuprofen for one week before surgery.  If you are on aspirin, any non-steroidal anti-inflammatory medications (Mobic, Aleve, Ibuprofen) should not be taken seven (7) days prior to your surgery.  You make take Tylenol for pain prior to surgery.  5. Medications - If you are taking daily heart and blood pressure medications, seizure, reflux, allergy, asthma, anxiety, pain or diabetes medications, make sure you notify the surgery center/hospital before the day of surgery so they can tell you which medications you should take or avoid the day of surgery. 6. No food or drink after midnight the night before surgery unless directed otherwise by surgical center/hospital staff. 7. No alcoholic beverages 24-hours prior to surgery.  No smoking 24-hours prior or 24-hours after  surgery. 8. Wear loose pants or shorts. They should be loose enough to fit over bandages, boots, and casts. 9. Don't wear slip-on shoes. Sneakers are preferred. 10. Bring your boot with you to the surgery center/hospital.  Also bring crutches or a walker if your physician has prescribed it for you.  If you do not have this equipment, it will be provided for you after surgery. 11. If you have not been contacted by the surgery center/hospital by the day before your surgery, call to confirm the date and time of your surgery. 12. Leave-time from work may vary depending on the type of surgery you have.  Appropriate arrangements should be made prior to surgery with your employer. 13. Prescriptions will be provided immediately following surgery by your doctor.  Fill these as soon as possible after surgery and take the medication as directed. Pain medications will not be refilled on weekends and must be approved by the doctor. 14. Remove nail polish on the operative foot and avoid getting pedicures prior to surgery. 15. Wash the night before surgery.  The night before surgery wash the foot and leg well with water and the antibacterial soap provided. Be sure to pay special attention to beneath the toenails and in between the toes.  Wash for at least three (3) minutes. Rinse thoroughly with water and dry well with a towel.  Perform this wash unless told not to do so by your physician.  Enclosed: 1 Ice pack (please put in freezer the night before surgery)   1 Hibiclens skin cleaner     Pre-op instructions  If you have any questions regarding the instructions, please do not hesitate to call our office.  [place]: [address], [place], [place] [address] -- [phone]  [place]: [address] [place], [place] [address] -- [phone]  [place]: [address], [place], [place] [address] -- [phone]   Website: [email or web address] 

## 2019-05-04 NOTE — Progress Notes (Addendum)
She presents today would like to go ahead and get scheduled for surgery.  She has had a Worker's Compensation claim that is still open for a osteochondrosis due to fracture of the head of the second metatarsal.  She states I feel like him walking on this not all the time and is exquisitely painful.  Objective: Vital signs are stable she is alert oriented x3 I reviewed her past medical history medications allergies surgeries and social history.  Pulses are strongly palpable.  She has severe pain on palpation of the second metatarsal head and neck at the level of the metatarsophalangeal joint left foot.  She also now has an elevated first metatarsal most likely secondary to compensation which is hypermobile at the first metatarsal medial cuneiform joint.  Radiographs taken today demonstrate an elevated first metatarsal and a sclerotic head of the second metatarsal at the subchondral bone.  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  Plan: We discussed etiology pathology conservative versus surgical therapies at this point we discussed possible postop complications it may include but not limited to postop pain bleeding swelling infection recurrence need for further surgery it may be associated with a plantarflexed 3 Lapidus procedure of the first metatarsal medial cuneiform joint and a second metatarsophalangeal joint arthroplasty with interposition of the tendon and pinning of the toe.  She understands this and is amenable to it we did discuss alternative procedures however she chose this 1.  We provided her with information regarding the surgery center and anesthesia group.  I will follow-up with her in the near future for surgical intervention.

## 2019-05-12 ENCOUNTER — Other Ambulatory Visit: Payer: Self-pay | Admitting: Adult Health

## 2019-05-15 ENCOUNTER — Other Ambulatory Visit: Payer: Self-pay | Admitting: Adult Health

## 2019-05-28 ENCOUNTER — Encounter: Payer: Self-pay | Admitting: Family Medicine

## 2019-05-28 ENCOUNTER — Ambulatory Visit (INDEPENDENT_AMBULATORY_CARE_PROVIDER_SITE_OTHER): Payer: BC Managed Care – PPO | Admitting: Family Medicine

## 2019-05-28 ENCOUNTER — Other Ambulatory Visit: Payer: Self-pay

## 2019-05-28 VITALS — BP 100/62 | HR 70 | Temp 98.2°F | Resp 12 | Ht 63.0 in | Wt 156.0 lb

## 2019-05-28 DIAGNOSIS — R5383 Other fatigue: Secondary | ICD-10-CM | POA: Diagnosis not present

## 2019-05-28 DIAGNOSIS — W57XXXA Bitten or stung by nonvenomous insect and other nonvenomous arthropods, initial encounter: Secondary | ICD-10-CM

## 2019-05-28 DIAGNOSIS — R509 Fever, unspecified: Secondary | ICD-10-CM | POA: Diagnosis not present

## 2019-05-28 MED ORDER — DOXYCYCLINE HYCLATE 100 MG PO TABS: 100.0000 mg | ORAL_TABLET | Freq: Two times a day (BID) | ORAL | 0 refills | Status: DC

## 2019-05-28 NOTE — Patient Instructions (Signed)
F/U pending results Start doxycycline 174m twice a day  We will call with results

## 2019-05-28 NOTE — Progress Notes (Signed)
   Subjective:    Patient ID: Candice Estrada, female    DOB: 09-24-1975, 44 y.o.   MRN: [id number]  Patient presents for Fatigue (x3 days- ody aches, eye pain, no energy- had tick on L side 4 days ago, no rash or irritation)   Pt here due to overwhelming feeling of fatigue, achiness, general feeling of unwell  Tick removed 10 days ago, she had a small scab, no rash,feels achy, no energy for past 3 -4 days. Slight intermittant fever, occ chills.  She has been a little nauseated, but no vomiting or diarrhea  feels foggy headed  No respiratory symptoms   She has been on vacation at [facility]  and had to come home early Tuesday  because she felt bad  No known sick contacts Works at [employer] office  No new meds given     She has not had COVID-19 vaccine      Review Of Systems:  GEN- + fatigue, +fever, weight loss,weakness, recent illness HEENT- denies eye drainage, change in vision, nasal discharge, CVS- denies chest pain, palpitations RESP- denies SOB, cough, wheeze ABD- + N/ no V, change in stools, abd pain GU- denies dysuria, hematuria, dribbling, incontinence MSK- denies joint pain, +muscle aches, injury Neuro- denies headache, dizziness, syncope, seizure activity       Objective:    BP 100/62   Pulse 70   Temp 98.2 F (36.8 C) (Temporal)   Resp 12   Ht 5' 3"  (1.6 m)   Wt 156 lb (70.8 kg)   SpO2 98%   BMI 27.63 kg/m  GEN- NAD, alert and oriented x3, non toxic appearing  HEENT- PERRL, EOMI, non injected sclera, pink conjunctiva, MMM, oropharynx clear Neck- Supple, no thyromegaly CVS- RRR, no murmur RESP-CTAB ABD-NABS,soft,NT,ND MSK- no swelling of knees, ankles, hands, elbows Skin- in tact, small scab previous tick bite, no rash EXT- No edema Pulses- Radial 2+        Assessment & Plan:      Problem List Items Addressed This Visit      Unprioritized   Fatigue    Pt with generalized fatigue, viral like symptoms but also recent tick bite Due to all  the symptoms being vague, will swab for COVID-19 to rule this out, she has not been vaccinated Start doxycycline while titers, CBC run for possible tick borne illness Push fluids Pending results further recommendations No acute distress noted, oxygen sat normal , bp her baseline       Relevant Orders   CBC with Differential/Platelet (Completed)   Comprehensive metabolic panel (Completed)   Rocky mtn spotted fvr abs pnl(IgG+IgM)    Other Visit Diagnoses    Tick bite, initial encounter    -  Primary   Relevant Orders   SARS-COV-2 RNA,(COVID-19) QUAL NAAT (Completed)   Lyme DiseaseAntibody(IgG), Immunoblot   Fever, unspecified fever cause       Out of work until testing results and improved symptoms based on CDC guidelines    Relevant Orders   CBC with Differential/Platelet (Completed)   Comprehensive metabolic panel (Completed)   SARS-COV-2 RNA,(COVID-19) QUAL NAAT (Completed)   Rocky mtn spotted fvr abs pnl(IgG+IgM)      Note: This dictation was prepared with Dragon dictation along with smaller Company secretary. Any transcriptional errors that result from this process are unintentional.

## 2019-05-29 LAB — SARS-COV-2 RNA,(COVID-19) QUALITATIVE NAAT: SARS CoV2 RNA: NOT DETECTED

## 2019-05-30 NOTE — Assessment & Plan Note (Signed)
Pt with generalized fatigue, viral like symptoms but also recent tick bite Due to all the symptoms being vague, will swab for COVID-19 to rule this out, she has not been vaccinated Start doxycycline while titers, CBC run for possible tick borne illness Push fluids Pending results further recommendations No acute distress noted, oxygen sat normal , bp her baseline

## 2019-05-31 LAB — COMPREHENSIVE METABOLIC PANEL
AG Ratio: 1.8 (calc) (ref 1.0–2.5)
ALT: 18 U/L (ref 6–29)
AST: 23 U/L (ref 10–35)
Albumin: 4.2 g/dL (ref 3.6–5.1)
Alkaline phosphatase (APISO): 57 U/L (ref 31–125)
BUN: 10 mg/dL (ref 7–25)
CO2: 28 mmol/L (ref 20–32)
Calcium: 9.2 mg/dL (ref 8.6–10.2)
Chloride: 105 mmol/L (ref 98–110)
Creat: 1 mg/dL (ref 0.50–1.10)
Globulin: 2.4 g/dL (calc) (ref 1.9–3.7)
Glucose, Bld: 91 mg/dL (ref 65–99)
Potassium: 4.3 mmol/L (ref 3.5–5.3)
Sodium: 140 mmol/L (ref 135–146)
Total Bilirubin: 0.5 mg/dL (ref 0.2–1.2)
Total Protein: 6.6 g/dL (ref 6.1–8.1)

## 2019-05-31 LAB — LYME DISEASE ANTIBODY (IGG), IMMUNOBLOT
B burgdorferi IgG Abs (IB): NEGATIVE
Lyme Disease 18 kD IgG: NONREACTIVE
Lyme Disease 23 kD IgG: NONREACTIVE
Lyme Disease 28 kD IgG: NONREACTIVE
Lyme Disease 30 kD IgG: NONREACTIVE
Lyme Disease 39 kD IgG: NONREACTIVE
Lyme Disease 41 kD IgG: REACTIVE — AB
Lyme Disease 45 kD IgG: NONREACTIVE
Lyme Disease 58 kD IgG: NONREACTIVE
Lyme Disease 66 kD IgG: NONREACTIVE
Lyme Disease 93 kD IgG: NONREACTIVE

## 2019-05-31 LAB — CBC WITH DIFFERENTIAL/PLATELET
Absolute Monocytes: 370 cells/uL (ref 200–950)
Basophils Absolute: 48 cells/uL (ref 0–200)
Basophils Relative: 1 %
Eosinophils Absolute: 120 cells/uL (ref 15–500)
Eosinophils Relative: 2.5 %
HCT: 46.2 % — ABNORMAL HIGH (ref 35.0–45.0)
Hemoglobin: 14.9 g/dL (ref 11.7–15.5)
Lymphs Abs: 1306 cells/uL (ref 850–3900)
MCH: 29.1 pg (ref 27.0–33.0)
MCHC: 32.3 g/dL (ref 32.0–36.0)
MCV: 90.2 fL (ref 80.0–100.0)
MPV: 9.5 fL (ref 7.5–12.5)
Monocytes Relative: 7.7 %
Neutro Abs: 2957 cells/uL (ref 1500–7800)
Neutrophils Relative %: 61.6 %
Platelets: 277 10*3/uL (ref 140–400)
RBC: 5.12 10*6/uL — ABNORMAL HIGH (ref 3.80–5.10)
RDW: 12.4 % (ref 11.0–15.0)
Total Lymphocyte: 27.2 %
WBC: 4.8 10*3/uL (ref 3.8–10.8)

## 2019-05-31 LAB — ROCKY MTN SPOTTED FVR ABS PNL(IGG+IGM)
RMSF IgG: NOT DETECTED
RMSF IgM: NOT DETECTED

## 2019-06-03 ENCOUNTER — Other Ambulatory Visit: Payer: Self-pay | Admitting: *Deleted

## 2019-06-03 DIAGNOSIS — R5383 Other fatigue: Secondary | ICD-10-CM

## 2019-06-03 DIAGNOSIS — W57XXXA Bitten or stung by nonvenomous insect and other nonvenomous arthropods, initial encounter: Secondary | ICD-10-CM

## 2019-06-15 IMAGING — DX DG ABDOMEN ACUTE W/ 1V CHEST
3 series · 3 of 3 positions shown · non-contrast
Comparison: None.

CLINICAL DATA: Lower abdominal pain and nausea for 3 days.

EXAM:
DG ABDOMEN ACUTE W/ 1V CHEST

[chest pa]
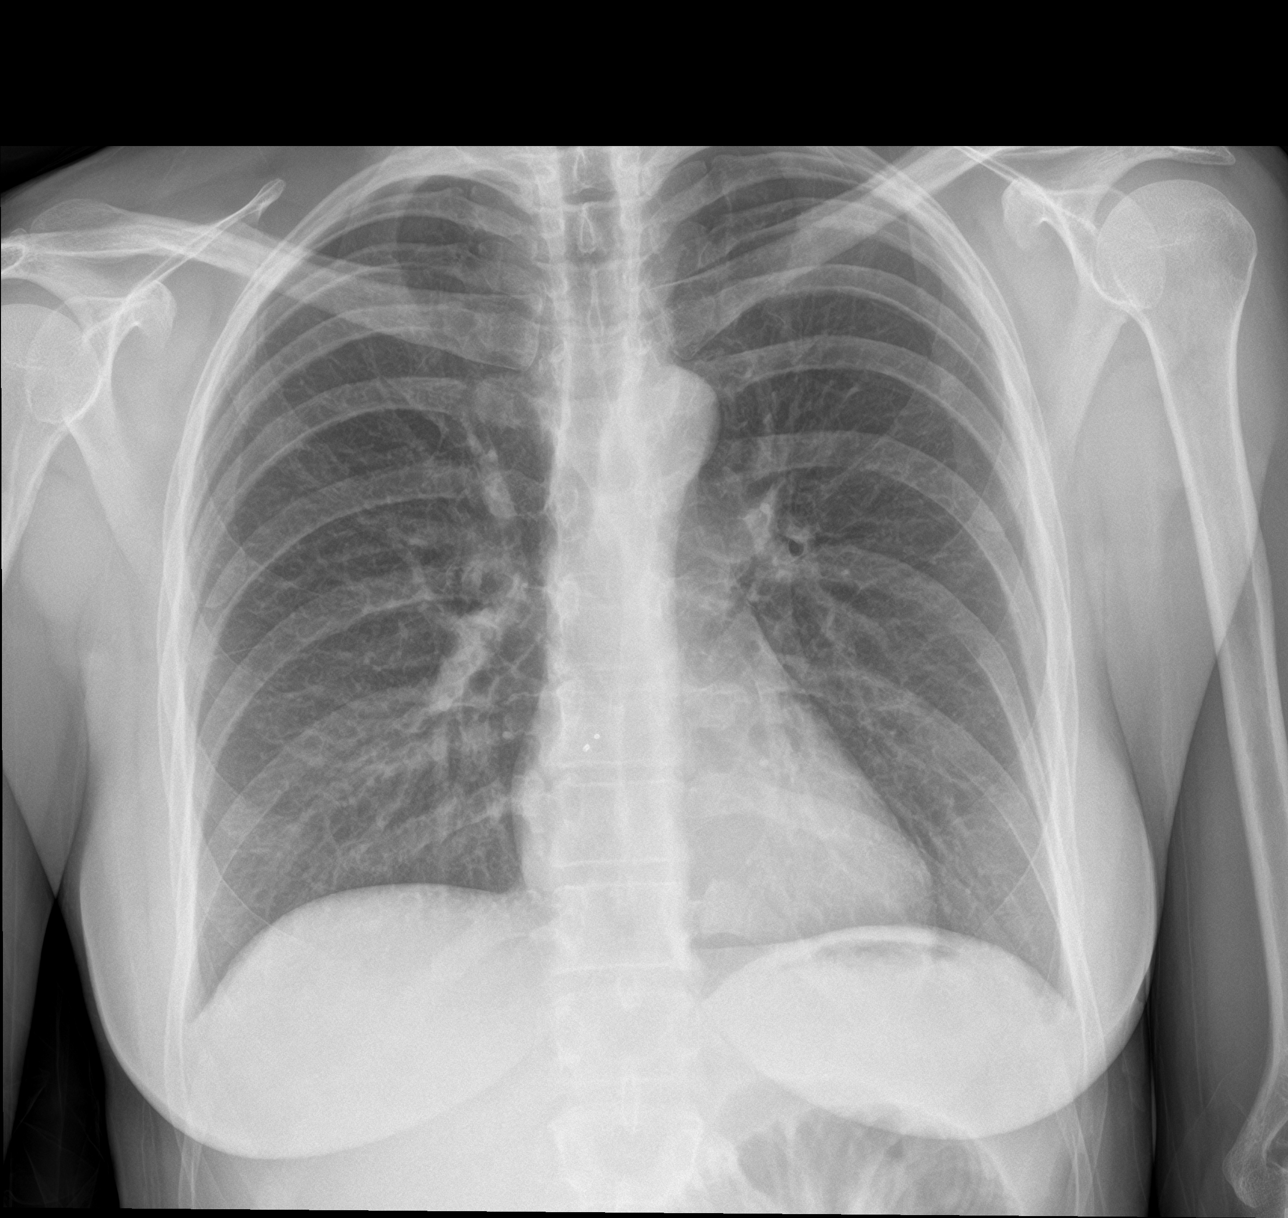

[abdomen erect]
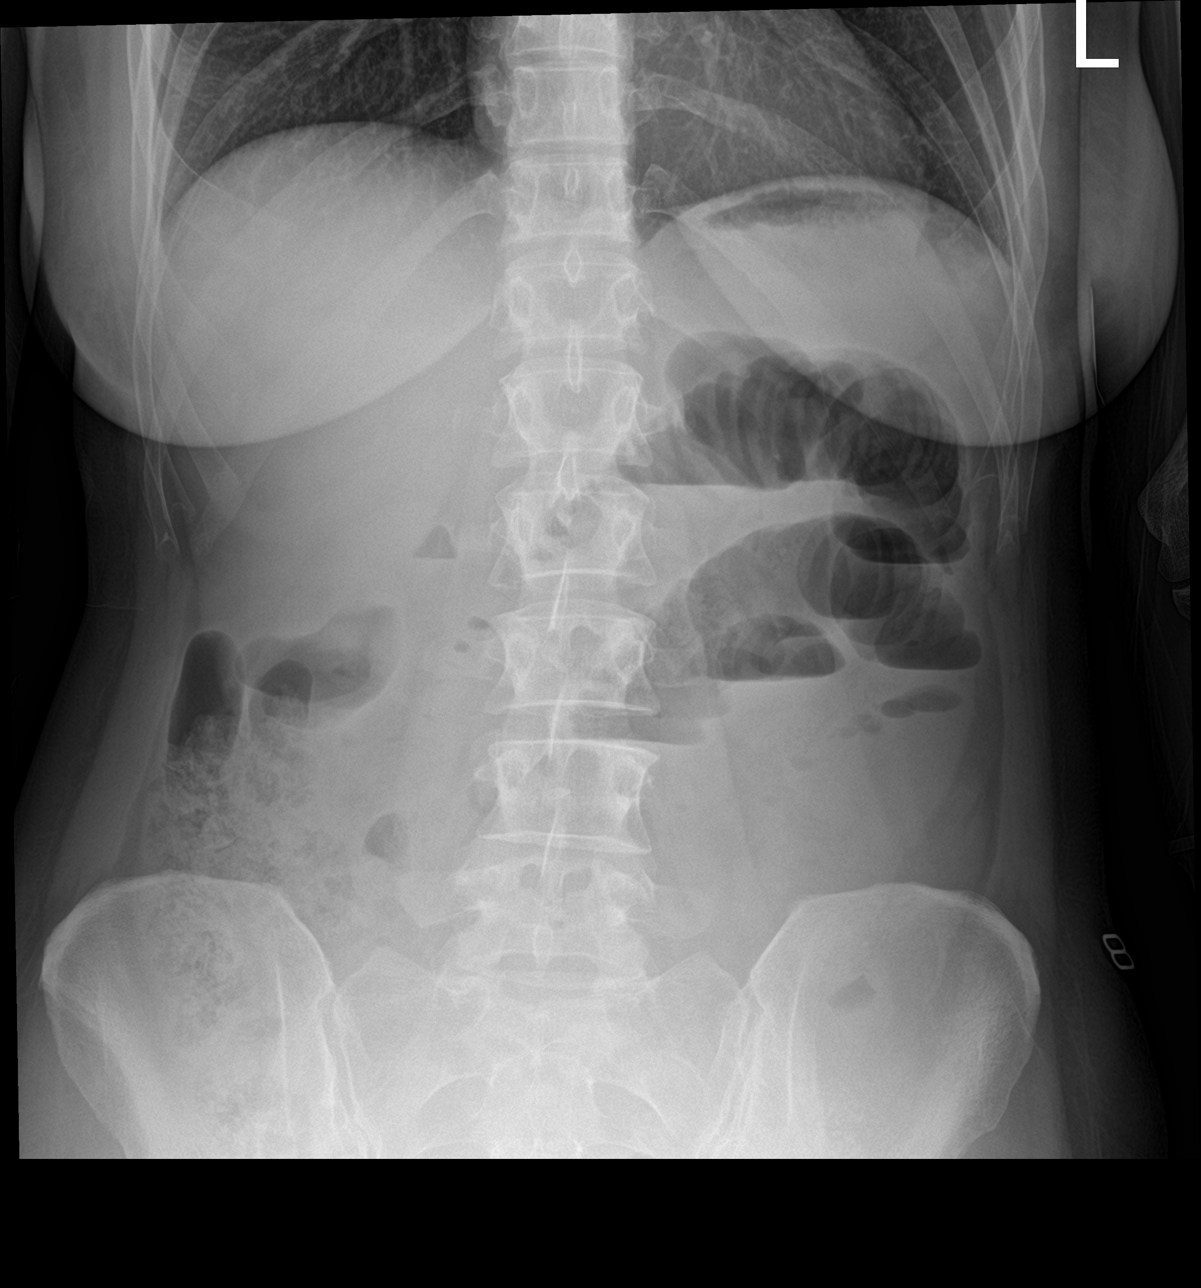

[abdomen supine]
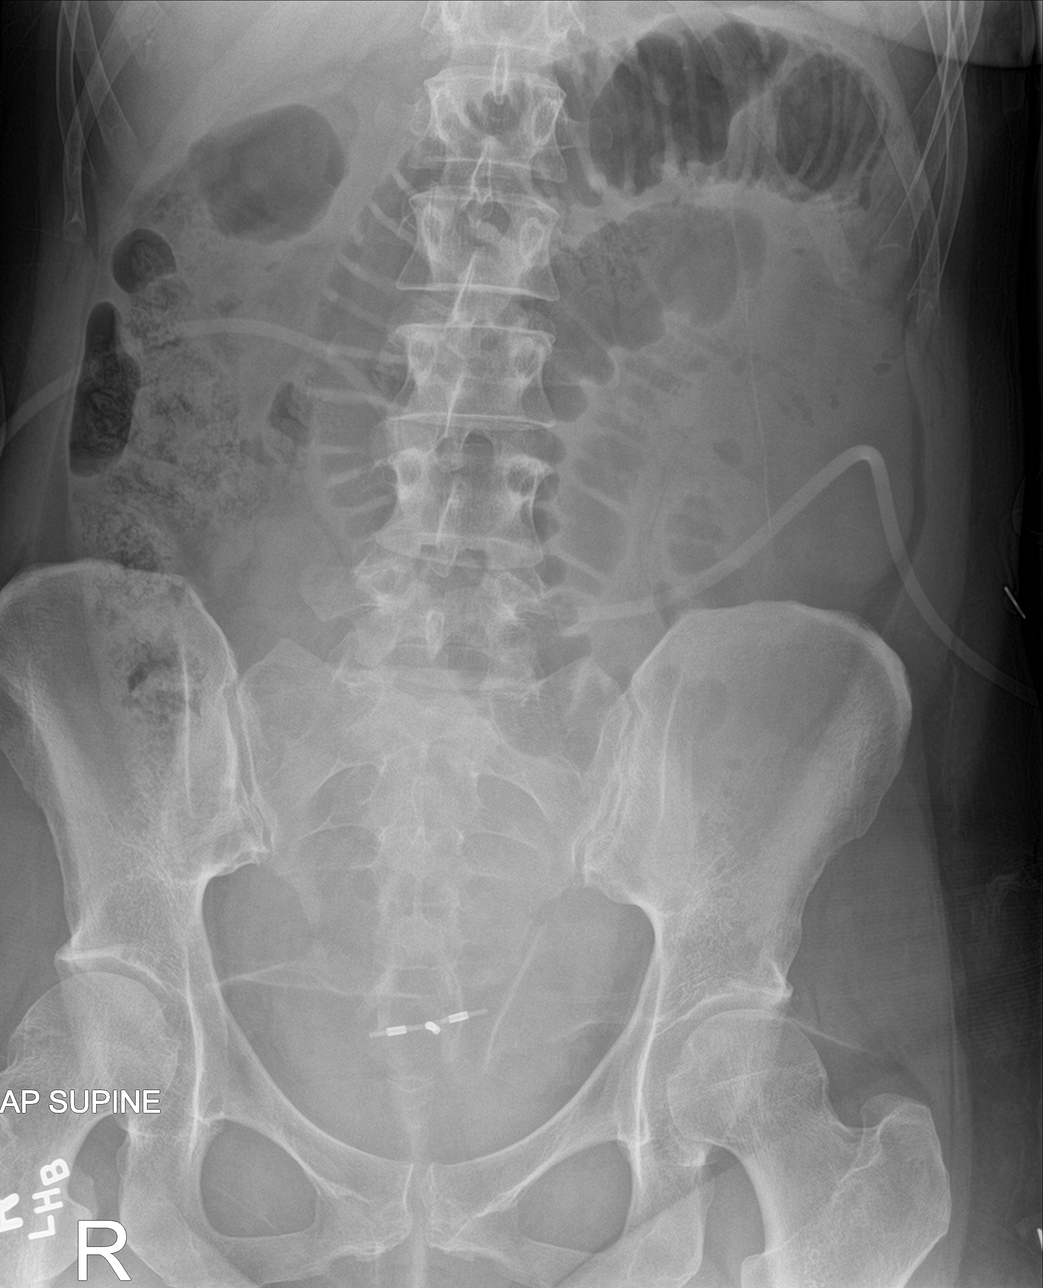

[3 of 3 positions shown; findings below may reference images not displayed]

FINDINGS: The cardiac silhouette, mediastinal and hilar contours are normal.
The lungs are clear. No pleural effusion.

There are dilated small bowel loops with air-fluid levels on the
upright film consistent with small-bowel obstruction. There is some
stool in the right colon but otherwise the colon appears
decompressed. No free air. The soft tissue shadows are maintained.
An IUD is noted in the central pelvis. Bony structures are
unremarkable.
IMPRESSION: 1. No acute cardiopulmonary findings.
2. Small-bowel obstruction bowel gas pattern.  No free air.

## 2019-06-22 ENCOUNTER — Other Ambulatory Visit: Payer: Self-pay | Admitting: Family Medicine

## 2019-06-22 DIAGNOSIS — Z1231 Encounter for screening mammogram for malignant neoplasm of breast: Secondary | ICD-10-CM

## 2019-07-09 ENCOUNTER — Telehealth: Payer: Self-pay | Admitting: *Deleted

## 2019-07-09 ENCOUNTER — Telehealth: Payer: Self-pay | Admitting: Podiatry

## 2019-07-09 NOTE — Telephone Encounter (Signed)
Pt called states she needs a note to be out of work until her surgery.

## 2019-07-09 NOTE — Telephone Encounter (Signed)
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 I told pt I would need to get orders from Dr. Milinda Pointer, and I would inform him of her request today.

## 2019-07-09 NOTE — Telephone Encounter (Signed)
Pt called and stated that she is still having a hard time even with the restrictions for her job her feet are in really bad pain she wanted to know if you would take her out of work till after her surgry please assist

## 2019-07-11 NOTE — Telephone Encounter (Signed)
That is fine with me.

## 2019-07-12 NOTE — Telephone Encounter (Signed)
Please advise 

## 2019-07-12 NOTE — Telephone Encounter (Addendum)
Left message for pt to confirm she does want to be out of work until 10/28/2019 and how she would like to receive.

## 2019-07-12 NOTE — Telephone Encounter (Signed)
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

## 2019-07-13 NOTE — Telephone Encounter (Signed)
I think I responded to valery regarding this request. That will be fine with me.

## 2019-07-14 ENCOUNTER — Other Ambulatory Visit: Payer: Self-pay | Admitting: Family Medicine

## 2019-07-14 ENCOUNTER — Encounter: Payer: Self-pay | Admitting: Podiatry

## 2019-07-14 DIAGNOSIS — Z1231 Encounter for screening mammogram for malignant neoplasm of breast: Secondary | ICD-10-CM

## 2019-07-16 ENCOUNTER — Telehealth: Payer: Self-pay | Admitting: *Deleted

## 2019-07-16 NOTE — Telephone Encounter (Signed)
Left message for pt to pick up the letter she requested today.

## 2019-07-16 NOTE — Telephone Encounter (Signed)
Pt states her workers Comp needs a note that states, "Candice Estrada has been advised to limit walking due to her diagnosis of Osteochondrosis and a dorsiflexed first metatarsal. It is of my medical opinion that the duties of her job that include excessive walking and standing have contributed to her medical condition. This condition was diagnosed after her left ankle surgery." I informed pt I would need to advise Dr.Hyatt of the notes requirements, prior to writing and I would call to advise her of when to pick it up.

## 2019-07-16 NOTE — Telephone Encounter (Signed)
Wonderful Valerie sounds very eloquent.  Put that print and sign my name please thank you

## 2019-07-29 ENCOUNTER — Ambulatory Visit (INDEPENDENT_AMBULATORY_CARE_PROVIDER_SITE_OTHER): Payer: BC Managed Care – PPO | Admitting: Adult Health

## 2019-07-29 ENCOUNTER — Other Ambulatory Visit (HOSPITAL_COMMUNITY)
Admission: RE | Admit: 2019-07-29 | Discharge: 2019-07-29 | Disposition: A | Discharge status: Home / Self Care | Payer: BC Managed Care – PPO | Source: Ambulatory Visit | Attending: Adult Health | Admitting: Adult Health

## 2019-07-29 ENCOUNTER — Encounter: Payer: Self-pay | Admitting: Adult Health

## 2019-07-29 VITALS — BP 103/70 | HR 73 | Ht 63.0 in | Wt 157.6 lb

## 2019-07-29 DIAGNOSIS — R4589 Other symptoms and signs involving emotional state: Secondary | ICD-10-CM | POA: Diagnosis not present

## 2019-07-29 DIAGNOSIS — Z01419 Encounter for gynecological examination (general) (routine) without abnormal findings: Secondary | ICD-10-CM | POA: Insufficient documentation

## 2019-07-29 DIAGNOSIS — Z975 Presence of (intrauterine) contraceptive device: Secondary | ICD-10-CM | POA: Diagnosis not present

## 2019-07-29 DIAGNOSIS — Z1211 Encounter for screening for malignant neoplasm of colon: Secondary | ICD-10-CM | POA: Insufficient documentation

## 2019-07-29 DIAGNOSIS — R87610 Atypical squamous cells of undetermined significance on cytologic smear of cervix (ASC-US): Secondary | ICD-10-CM | POA: Diagnosis not present

## 2019-07-29 LAB — HEMOCCULT GUIAC POC 1CARD (OFFICE): Fecal Occult Blood, POC: NEGATIVE

## 2019-07-29 MED ORDER — BUPROPION HCL ER (SR) 150 MG PO TB12: 150.0000 mg | ORAL_TABLET | Freq: Every day | ORAL | 3 refills | Status: DC

## 2019-07-29 NOTE — Progress Notes (Signed)
Patient ID: Candice Estrada, female   DOB: 1975-10-24, 44 y.o.   MRN: [id number] History of Present Illness: Candice Estrada is a 44 year old white female, married, [id number], has an IUD in for a well woman gyn exam and pap. PCP is Dr Buelah Manis   Current Medications, Allergies, Past Medical History, Past Surgical History, Family History and Social History were reviewed in [employer] record.     Review of Systems:  Patient denies any headaches, hearing loss, fatigue, blurred vision, shortness of breath, chest pain, abdominal pain, problems with bowel movements, urination, or intercourse. No joint pain or mood swings. Has pain left foot needs surgery    Physical Exam:BP 103/70 (BP Location: Right Arm, Patient Position: Sitting, Cuff Size: Normal)    Pulse 73    Ht 5' 3"  (1.6 m)    Wt 157 lb 9.6 oz (71.5 kg)    LMP 07/05/2019    BMI 27.92 kg/m  General:  Well developed, well nourished, no acute distress Skin:  Warm and dry Neck:  Midline trachea, normal thyroid, good ROM, no lymphadenopathy Lungs; Clear to auscultation bilaterally Breast:  No dominant palpable mass, retraction, or nipple discharge Cardiovascular: Regular rate and rhythm Abdomen:  Soft, non tender, no hepatosplenomegaly Pelvic:  External genitalia is normal in appearance, no lesions.  The vagina is normal in appearance. Urethra has no lesions or masses. The cervix is bulbous,pap with high risk HPV 16/18 genotyping performed,+IUD strings.  Uterus is felt to be normal size, shape, and contour.  No adnexal masses or tenderness noted.Bladder is non tender, no masses felt. Rectal: Good sphincter tone, no polyps, or hemorrhoids felt.  Hemoccult negative. Extremities/musculoskeletal:  No swelling or varicosities noted, no clubbing or cyanosis Psych:  No mood changes, alert and cooperative,seems happy AA is 2 PHQ 9 score is 3, no SI, on wellbutrin  Upstream - 07/29/19 1048      Pregnancy Intention Screening   Does the  patient want to become pregnant in the next year? No    Does the patient's partner want to become pregnant in the next year? No    Would the patient like to discuss contraceptive options today? Yes      Contraception Wrap Up   Current Method IUD or IUS    End Method IUD or IUS    Contraception Counseling Provided Yes         Examination chaperoned by Levy Pupa LPN  Impression and Plan: 1. Encounter for gynecological examination with Papanicolaou smear of cervix Pap sent Physical in 1 year Pap in 3 if normal Labs with PCP Mammogram in August and yearly   2. IUD (intrauterine device) in place Return in 8 months for removal and reinsertion  3. Moody Continue Wellburtin Meds ordered this encounter  Medications   buPROPion (WELLBUTRIN SR) 150 MG 12 hr tablet    Sig: Take 1 tablet (150 mg total) by mouth daily.    Dispense:  90 tablet    Refill:  3    Order Specific Question:   Supervising Provider    Answer:   Elonda Husky, LUTHER H [[id number]]    4. Encounter for screening fecal occult blood testing Colonoscopy per GI

## 2019-08-04 ENCOUNTER — Other Ambulatory Visit: Payer: Self-pay

## 2019-08-04 ENCOUNTER — Encounter: Payer: Self-pay | Admitting: Podiatry

## 2019-08-04 ENCOUNTER — Encounter: Payer: Self-pay | Admitting: Adult Health

## 2019-08-04 ENCOUNTER — Ambulatory Visit (INDEPENDENT_AMBULATORY_CARE_PROVIDER_SITE_OTHER): Admitting: Podiatry

## 2019-08-04 DIAGNOSIS — R87610 Atypical squamous cells of undetermined significance on cytologic smear of cervix (ASC-US): Secondary | ICD-10-CM

## 2019-08-04 DIAGNOSIS — M21962 Unspecified acquired deformity of left lower leg: Secondary | ICD-10-CM | POA: Diagnosis not present

## 2019-08-04 HISTORY — DX: Atypical squamous cells of undetermined significance on cytologic smear of cervix (ASC-US): R87.610

## 2019-08-04 LAB — CYTOLOGY - PAP
Comment: NEGATIVE
Diagnosis: UNDETERMINED — AB
High risk HPV: NEGATIVE

## 2019-08-04 NOTE — Progress Notes (Signed)
She presents today she is already scheduled for surgery regarding a Lapidus procedure and a arthroplasty second metatarsophalangeal joint.  But she wanted to discuss today about getting a note for work.  So we discussed that and I will be happy to write her a note and I will taken to [facility] tomorrow.  If you need a copy of this make sure to look in the notes section of the computer.

## 2019-08-10 ENCOUNTER — Other Ambulatory Visit: Payer: Self-pay

## 2019-08-10 DIAGNOSIS — W57XXXA Bitten or stung by nonvenomous insect and other nonvenomous arthropods, initial encounter: Secondary | ICD-10-CM

## 2019-08-10 DIAGNOSIS — R5383 Other fatigue: Secondary | ICD-10-CM

## 2019-08-11 ENCOUNTER — Encounter: Payer: Self-pay | Admitting: Podiatry

## 2019-08-12 ENCOUNTER — Telehealth: Payer: Self-pay | Admitting: *Deleted

## 2019-08-12 ENCOUNTER — Encounter: Payer: BC Managed Care – PPO | Admitting: Podiatry

## 2019-08-12 NOTE — Telephone Encounter (Signed)
Message left informing pt the letter was ready for pick up or fax.

## 2019-08-12 NOTE — Telephone Encounter (Signed)
08/10/2019 Fax from pt, given to Dr. Milinda Pointer was given to me 08/12/2019, to put pt's exact wording on our letterhead to be signed by Dr. Milinda Pointer.

## 2019-08-16 DIAGNOSIS — N390 Urinary tract infection, site not specified: Secondary | ICD-10-CM | POA: Diagnosis not present

## 2019-08-17 LAB — LYME DISEASE ANTIBODY (IGG), IMMUNOBLOT
B burgdorferi IgG Abs (IB): NEGATIVE
Lyme Disease 18 kD IgG: NONREACTIVE
Lyme Disease 23 kD IgG: NONREACTIVE
Lyme Disease 28 kD IgG: NONREACTIVE
Lyme Disease 30 kD IgG: NONREACTIVE
Lyme Disease 39 kD IgG: NONREACTIVE
Lyme Disease 41 kD IgG: REACTIVE — AB
Lyme Disease 45 kD IgG: NONREACTIVE
Lyme Disease 58 kD IgG: NONREACTIVE
Lyme Disease 66 kD IgG: NONREACTIVE
Lyme Disease 93 kD IgG: NONREACTIVE

## 2019-08-19 ENCOUNTER — Encounter: Payer: BC Managed Care – PPO | Admitting: Podiatry

## 2019-08-24 ENCOUNTER — Telehealth: Payer: Self-pay | Admitting: Podiatry

## 2019-08-25 ENCOUNTER — Ambulatory Visit
Admission: RE | Admit: 2019-08-25 | Discharge: 2019-08-25 | Disposition: A | Discharge status: Home / Self Care | Payer: BC Managed Care – PPO | Source: Ambulatory Visit

## 2019-08-25 ENCOUNTER — Other Ambulatory Visit: Payer: Self-pay

## 2019-08-25 DIAGNOSIS — Z1231 Encounter for screening mammogram for malignant neoplasm of breast: Secondary | ICD-10-CM | POA: Diagnosis not present

## 2019-08-25 NOTE — Telephone Encounter (Signed)
Can you please give me a call back. Thank you.

## 2019-08-26 NOTE — Telephone Encounter (Signed)
Pt called to see what the soonest available sx date would be before her 10-08 sx date. I gave pt a few options for possibly September. Pt stated she wanted to keep sx on 10-08 and stated workers compensation would probably be reaching out why her sx is not until then. Pt requested that we tell them 10-08 was the first available.

## 2019-09-07 ENCOUNTER — Encounter: Payer: BC Managed Care – PPO | Admitting: Podiatry

## 2019-09-21 ENCOUNTER — Encounter: Payer: BC Managed Care – PPO | Admitting: Podiatry

## 2019-09-21 DIAGNOSIS — R350 Frequency of micturition: Secondary | ICD-10-CM | POA: Diagnosis not present

## 2019-09-21 DIAGNOSIS — Z87442 Personal history of urinary calculi: Secondary | ICD-10-CM | POA: Diagnosis not present

## 2019-09-21 DIAGNOSIS — R351 Nocturia: Secondary | ICD-10-CM | POA: Diagnosis not present

## 2019-09-21 DIAGNOSIS — R310 Gross hematuria: Secondary | ICD-10-CM | POA: Diagnosis not present

## 2019-10-04 DIAGNOSIS — N3946 Mixed incontinence: Secondary | ICD-10-CM | POA: Diagnosis not present

## 2019-10-04 DIAGNOSIS — M62838 Other muscle spasm: Secondary | ICD-10-CM | POA: Diagnosis not present

## 2019-10-04 DIAGNOSIS — M6281 Muscle weakness (generalized): Secondary | ICD-10-CM | POA: Diagnosis not present

## 2019-10-04 DIAGNOSIS — M6289 Other specified disorders of muscle: Secondary | ICD-10-CM | POA: Diagnosis not present

## 2019-10-06 DIAGNOSIS — H11241 Scarring of conjunctiva, right eye: Secondary | ICD-10-CM | POA: Diagnosis not present

## 2019-10-18 ENCOUNTER — Telehealth: Payer: Self-pay

## 2019-10-18 NOTE — Telephone Encounter (Signed)
DOS 10/22/2019  LAPIDUS PRO INS BUNIONECTOMY LT - 94585 ARTHROPLASTY LESSER MPJ W/TOTAL IMPLANT 2ND LT - 92924  SPOKE TO SAILO AT [facility] - HE STATED THE SX WAS APPROVED 10/07/2019 WITH AUTH # [id number]. CALL REF # [id number]

## 2019-10-20 ENCOUNTER — Other Ambulatory Visit: Payer: Self-pay | Admitting: Podiatry

## 2019-10-20 ENCOUNTER — Telehealth: Payer: Self-pay | Admitting: Podiatry

## 2019-10-20 MED ORDER — DOCUSATE SODIUM 50 MG PO CAPS
50.0000 mg | ORAL_CAPSULE | Freq: Two times a day (BID) | ORAL | 0 refills | Status: DC
Start: 2019-10-20 — End: 2020-03-29

## 2019-10-20 MED ORDER — ONDANSETRON HCL 4 MG PO TABS: 4.0000 mg | ORAL_TABLET | Freq: Three times a day (TID) | ORAL | 0 refills | Status: DC | PRN

## 2019-10-20 MED ORDER — OXYCODONE-ACETAMINOPHEN 10-325 MG PO TABS: 1.0000 | ORAL_TABLET | Freq: Three times a day (TID) | ORAL | 0 refills | Status: AC | PRN

## 2019-10-20 MED ORDER — CEPHALEXIN 500 MG PO CAPS
500.0000 mg | ORAL_CAPSULE | Freq: Three times a day (TID) | ORAL | 0 refills | Status: DC
Start: 2019-10-20 — End: 2019-11-11

## 2019-10-20 NOTE — Addendum Note (Signed)
Addended by: Clovis Riley E on: 10/20/2019 03:56 PM   Modules accepted: Orders

## 2019-10-20 NOTE — Telephone Encounter (Signed)
Patient called and saw where medication was called in for her surgery on Friday. She states that before with pain medication that she had horrible constipation and was asking Dr. Milinda Pointer to call something in for her to help with this.

## 2019-10-22 DIAGNOSIS — M21962 Unspecified acquired deformity of left lower leg: Secondary | ICD-10-CM

## 2019-10-22 DIAGNOSIS — M9272 Juvenile osteochondrosis of metatarsus, left foot: Secondary | ICD-10-CM

## 2019-10-25 ENCOUNTER — Telehealth: Payer: Self-pay | Admitting: Podiatry

## 2019-10-25 NOTE — Telephone Encounter (Signed)
Patient called in regards to questions postop. She was not sure of when to remove the "pain pump". She was not sure if it was today or tomorrow. Discussed usually leaving them in for 3 days. She is going to remove it later today. Discussed to go ahead and take pain medication before and stay on the pain medication to help avoid increase in pain. Discussed ice/elevation as well.   She feels that the foot is swollen but informed her to ice/elevate. If the cast becomes tight to let us know so it can be removed or bi-valved.   No further questions.

## 2019-10-27 ENCOUNTER — Other Ambulatory Visit: Payer: Self-pay | Admitting: Adult Health

## 2019-10-27 MED ORDER — IBUPROFEN 800 MG PO TABS: 800.0000 mg | ORAL_TABLET | Freq: Three times a day (TID) | ORAL | 4 refills | Status: DC | PRN

## 2019-10-27 NOTE — Progress Notes (Signed)
Refill motrin 800 mg

## 2019-10-28 ENCOUNTER — Ambulatory Visit (INDEPENDENT_AMBULATORY_CARE_PROVIDER_SITE_OTHER): Payer: BC Managed Care – PPO | Admitting: Podiatry

## 2019-10-28 ENCOUNTER — Ambulatory Visit (INDEPENDENT_AMBULATORY_CARE_PROVIDER_SITE_OTHER)

## 2019-10-28 ENCOUNTER — Encounter: Payer: Self-pay | Admitting: Podiatry

## 2019-10-28 ENCOUNTER — Other Ambulatory Visit: Payer: Self-pay

## 2019-10-28 DIAGNOSIS — M21962 Unspecified acquired deformity of left lower leg: Secondary | ICD-10-CM

## 2019-10-28 DIAGNOSIS — M9272 Juvenile osteochondrosis of metatarsus, left foot: Secondary | ICD-10-CM

## 2019-10-28 DIAGNOSIS — Z9889 Other specified postprocedural states: Secondary | ICD-10-CM

## 2019-10-28 NOTE — Progress Notes (Signed)
She presents today for her first postop visit date of surgery is 10/22/2019 status post Lapidus procedure and arthroplasty of the second toe.  She states that time she is doing very well though she did have some pain with that she is only taken 2 pain pills a day now 1 in the morning 1 at night.  Objective: Vital signs are stable she alert oriented x3 there is no erythema edema cellulitis drainage or odor cast is intact internal fixation is intact via x-ray does not demonstrate any loosening.  There is still some swelling to the foot.  Loose loosening of the cast is noted proximally.  She has good range of motion of her toes.  Assessment: Well-healing surgical foot x1 week.  Plan: She will follow up with [place] in 1 week for cast removal redressed with a dry sterile compressive dressing and reapplication of another cast.  She will remain nonweightbearing.

## 2019-11-09 ENCOUNTER — Encounter: Payer: BC Managed Care – PPO | Admitting: Podiatry

## 2019-11-10 NOTE — Progress Notes (Signed)
This encounter was created in error - please disregard.

## 2019-11-11 ENCOUNTER — Encounter: Payer: Self-pay | Admitting: Podiatry

## 2019-11-11 ENCOUNTER — Telehealth: Payer: Self-pay | Admitting: Podiatry

## 2019-11-11 ENCOUNTER — Ambulatory Visit (INDEPENDENT_AMBULATORY_CARE_PROVIDER_SITE_OTHER): Admitting: Podiatry

## 2019-11-11 ENCOUNTER — Other Ambulatory Visit: Payer: Self-pay

## 2019-11-11 DIAGNOSIS — Z9889 Other specified postprocedural states: Secondary | ICD-10-CM

## 2019-11-11 DIAGNOSIS — M9272 Juvenile osteochondrosis of metatarsus, left foot: Secondary | ICD-10-CM

## 2019-11-11 DIAGNOSIS — M21962 Unspecified acquired deformity of left lower leg: Secondary | ICD-10-CM

## 2019-11-11 NOTE — Telephone Encounter (Signed)
Good Morning   Patient needed a copy of Surgical Op Report, per surgery center it is still pending, waiting on you to sign off on it.

## 2019-11-13 NOTE — Progress Notes (Signed)
She presents today for postop visit date of surgery is 10/22/2019 for a Lapidus fusion bunion correction a second metatarsal phalangeal joint arthroplasty with pinning of the toe and interposition of the tendon.  She states all in all is doing very well she has some pain across the top of the foot around the TMT joints that she points to the area.  Denies fever chills nausea vomiting chest pain shortness of breath calf pain.  Objective: Cast is intact dry and clean on the bottom once removed demonstrates dry sterile dressing is intact no bleeding.  Dry sterile dressing was removed demonstrates minimal edema no erythema cellulitis drainage or odor she has some tenderness on range of motion of the first metatarsophalangeal joint and has pain retained to the second toe.  Incisions are coapting well but look very good I see no signs of infection.  Assessment: Well-healing surgical foot.  Plan: Redressed dressed a compressive dressing replaced cast nonweightbearing we will follow-up with her in 2 more weeks at which time we may put another cast on.  Patient requested cast states that she feels more comfortable in the cast and definitely does not want to have an accident in a boot that may not be as safe as a cast.  She will call with questions or concerns

## 2019-11-23 ENCOUNTER — Encounter: Payer: BC Managed Care – PPO | Admitting: Podiatry

## 2019-11-25 ENCOUNTER — Encounter: Payer: Self-pay | Admitting: Podiatry

## 2019-11-25 ENCOUNTER — Ambulatory Visit (INDEPENDENT_AMBULATORY_CARE_PROVIDER_SITE_OTHER): Admitting: Podiatry

## 2019-11-25 ENCOUNTER — Other Ambulatory Visit: Payer: Self-pay

## 2019-11-25 ENCOUNTER — Other Ambulatory Visit: Payer: Self-pay | Admitting: Podiatry

## 2019-11-25 DIAGNOSIS — M21962 Unspecified acquired deformity of left lower leg: Secondary | ICD-10-CM

## 2019-11-25 DIAGNOSIS — M9272 Juvenile osteochondrosis of metatarsus, left foot: Secondary | ICD-10-CM

## 2019-11-25 DIAGNOSIS — Z9889 Other specified postprocedural states: Secondary | ICD-10-CM

## 2019-11-27 NOTE — Progress Notes (Signed)
She presents today date of surgery 10/22/2019 status post Lapidus procedure and a arthroplasty with pin fixation and interposition of tendon second metatarsophalangeal joint left.  She denies fever chills nausea vomiting states that is been a little more tender as she has been up on it more ambulating with her knee scooter.  Objective: She presents today with her cast dry and clean and intact.  Once the cast was removed there remains moderate edema to the forefoot K wires in place to the dorsal aspect of the second toe there is no erythema cellulitis drainage or odor incision sites are healing very nicely.  Assessment: Well-healing surgical foot.  Plan: Redressed today with a dry sterile compressive dressing placed her back in a cast.  She will follow up with [place] in 2 weeks for cast removal at which time x-rays will be necessary.

## 2019-11-30 ENCOUNTER — Encounter: Payer: BC Managed Care – PPO | Admitting: Podiatry

## 2019-12-07 ENCOUNTER — Ambulatory Visit (INDEPENDENT_AMBULATORY_CARE_PROVIDER_SITE_OTHER)

## 2019-12-07 ENCOUNTER — Ambulatory Visit (INDEPENDENT_AMBULATORY_CARE_PROVIDER_SITE_OTHER): Admitting: Podiatry

## 2019-12-07 ENCOUNTER — Encounter: Payer: BC Managed Care – PPO | Admitting: Podiatry

## 2019-12-07 ENCOUNTER — Other Ambulatory Visit: Payer: Self-pay

## 2019-12-07 DIAGNOSIS — Z9889 Other specified postprocedural states: Secondary | ICD-10-CM

## 2019-12-07 DIAGNOSIS — M9272 Juvenile osteochondrosis of metatarsus, left foot: Secondary | ICD-10-CM

## 2019-12-07 DIAGNOSIS — M21962 Unspecified acquired deformity of left lower leg: Secondary | ICD-10-CM

## 2019-12-07 NOTE — Progress Notes (Signed)
She presents today date of surgery 10/22/2019 Lapidus procedure and arthroplasty of the second metatarsophalangeal joint.  She denies fever chills nausea vomiting muscle aches and pain states that it seems to be doing pretty well.  She is ready to get this pin out here.  Objective: Vital signs are stable alert oriented x3 presents on crutches today nonweightbearing cast is intact was removed demonstrates dry sterile dressing is intact moderate edema to the surgical foot.  K wire still intact to the dorsal aspect of the toe.  I removed the K wire today there was no bleeding no signs of infection no purulence.  She has good range of motion of the first and second metatarsophalangeal joint though it obviously is stiff and needs to be removed.  Radiographs taken today demonstrate an healing across the osteotomy site.  Assessment: Well-healing surgical foot.  Plan: Put her nonweightbearing in a cam boot.  I will allow her to start washing this tomorrow and follow-up with her in a week or so.

## 2019-12-21 ENCOUNTER — Ambulatory Visit (INDEPENDENT_AMBULATORY_CARE_PROVIDER_SITE_OTHER): Payer: BC Managed Care – PPO | Admitting: Podiatry

## 2019-12-21 ENCOUNTER — Other Ambulatory Visit: Payer: Self-pay

## 2019-12-21 ENCOUNTER — Ambulatory Visit (INDEPENDENT_AMBULATORY_CARE_PROVIDER_SITE_OTHER)

## 2019-12-21 ENCOUNTER — Encounter: Payer: Self-pay | Admitting: Podiatry

## 2019-12-21 DIAGNOSIS — Z9889 Other specified postprocedural states: Secondary | ICD-10-CM

## 2019-12-21 DIAGNOSIS — M21962 Unspecified acquired deformity of left lower leg: Secondary | ICD-10-CM

## 2019-12-21 DIAGNOSIS — M2012 Hallux valgus (acquired), left foot: Secondary | ICD-10-CM | POA: Diagnosis not present

## 2019-12-21 DIAGNOSIS — M9272 Juvenile osteochondrosis of metatarsus, left foot: Secondary | ICD-10-CM

## 2019-12-21 NOTE — Progress Notes (Signed)
She presents today for for fourth postop visit date of surgery 10/22/2019 Lapidus procedure and a second metatarsal phalangeal joint arthroplasty with interposition of the tendon.  She is to had a cast on which she did we took off she has been in a cam walker for the past few weeks she states is been doing pretty well not a lot of pain.  States that she did bump it on her shower door today which caused exquisite pain.  Objective: Vital signs are stable alert oriented x3 there is moderate edema no erythema cellulitis drainage or odor she has good range of motion of the second toe though it is painful on range of motion she has limited range of motion of the first metatarsophalangeal joint.  Radiographs taken today demonstrate well healing fusion sites.  Toe is rectus.  Assessment: Well-healing surgical foot no visible trauma.  Plan: Put her back in her cam walker today unguinal asked that she remain nonweightbearing with exception of stance.  We will follow-up with her in a couple weeks at which time we will move onto gait.

## 2019-12-21 NOTE — Addendum Note (Signed)
Addended by: Rip Harbour on: 12/21/2019 06:01 PM   Modules accepted: Orders

## 2020-01-05 ENCOUNTER — Other Ambulatory Visit: Payer: Self-pay

## 2020-01-05 ENCOUNTER — Ambulatory Visit
Admission: EM | Admit: 2020-01-05 | Discharge: 2020-01-05 | Disposition: A | Discharge status: Home / Self Care | Payer: BC Managed Care – PPO

## 2020-01-06 DIAGNOSIS — Z20828 Contact with and (suspected) exposure to other viral communicable diseases: Secondary | ICD-10-CM | POA: Diagnosis not present

## 2020-01-06 DIAGNOSIS — J069 Acute upper respiratory infection, unspecified: Secondary | ICD-10-CM | POA: Diagnosis not present

## 2020-01-11 ENCOUNTER — Encounter: Payer: BC Managed Care – PPO | Admitting: Podiatry

## 2020-01-13 ENCOUNTER — Ambulatory Visit (INDEPENDENT_AMBULATORY_CARE_PROVIDER_SITE_OTHER): Payer: BC Managed Care – PPO

## 2020-01-13 ENCOUNTER — Other Ambulatory Visit: Payer: Self-pay

## 2020-01-13 ENCOUNTER — Encounter: Payer: Self-pay | Admitting: Podiatry

## 2020-01-13 ENCOUNTER — Ambulatory Visit (INDEPENDENT_AMBULATORY_CARE_PROVIDER_SITE_OTHER): Payer: BC Managed Care – PPO | Admitting: Podiatry

## 2020-01-13 DIAGNOSIS — M21962 Unspecified acquired deformity of left lower leg: Secondary | ICD-10-CM

## 2020-01-13 DIAGNOSIS — Z9889 Other specified postprocedural states: Secondary | ICD-10-CM

## 2020-01-13 DIAGNOSIS — M2012 Hallux valgus (acquired), left foot: Secondary | ICD-10-CM | POA: Diagnosis not present

## 2020-01-13 NOTE — Progress Notes (Signed)
She presents today status post Lapidus bunionectomy with arthroplasty of the second metatarsophalangeal joint and interposition of the tendon. She states that she has been home walking around without her cam walker and without her crutches for a while. States that she is got some tenderness beneath the first TMT joint. She still has some soreness around the second metatarsophalangeal joint. Denies fever chills nausea vomiting muscle aches and pains with exception of what she thinks was Covid over the Christmas holiday.  Objective: Signs are stable she is alert and oriented x3. Pulses are palpable. Much decrease in edema. Great range of motion of the first and second metatarsophalangeal joints though she does have tenderness beneath the fusion site of the Lapidus.  Radiographs today taken demonstrate what appears to be a healing Lapidus procedure.  Assessment: Well-healing surgical foot.  Plan: At this point I am going to send her to physical therapy to help reduce the symptoms the soreness the tenderness and get her strength back. More follow-up with her in the near future once physical therapy is complete.

## 2020-02-10 ENCOUNTER — Ambulatory Visit (INDEPENDENT_AMBULATORY_CARE_PROVIDER_SITE_OTHER): Admitting: Podiatry

## 2020-02-10 ENCOUNTER — Ambulatory Visit (INDEPENDENT_AMBULATORY_CARE_PROVIDER_SITE_OTHER)

## 2020-02-10 ENCOUNTER — Other Ambulatory Visit: Payer: Self-pay

## 2020-02-10 DIAGNOSIS — M2012 Hallux valgus (acquired), left foot: Secondary | ICD-10-CM

## 2020-02-10 DIAGNOSIS — M21962 Unspecified acquired deformity of left lower leg: Secondary | ICD-10-CM

## 2020-02-10 DIAGNOSIS — Z9889 Other specified postprocedural states: Secondary | ICD-10-CM

## 2020-02-11 ENCOUNTER — Telehealth: Payer: Self-pay | Admitting: *Deleted

## 2020-02-11 NOTE — Telephone Encounter (Signed)
Patient is requesting an order for new bone stimulator if needed since mentioning she may have to use one.

## 2020-02-11 NOTE — Progress Notes (Signed)
She presents today after going to physical therapy date of surgery October 22, 2019 status post plantar flexor a Lapidus procedure with second metatarsal head resection interposition of the tendon and pinning.  She states that the foot is just really not doing well she cannot bend the toe.  States that she was not able to bend the toe even after the first surgery.  She states that at this point she can even walk is barely being able to walk in her home with her shoes on doing what she has to do.  Objective: Vital signs are stable alert oriented x3 pulses are palpable.  Much decrease in edema to the left foot that she still has significant pain plantar aspect of the first TMT joint.  She has great range of motion of the second metatarsophalangeal joint but is unable to plantarflex or dorsiflex the first metatarsophalangeal joint passively or actively.  Radiographs taken today demonstrate a rectus second metatarsophalangeal joint arthroplasty and toe.  She has a slightly dorsiflexed first metatarsal of the left foot and it appears that the internal fixation may be slightly loosened.  This could be very well due to the obviously the lack of fusion of this joint and physical therapy as well being over aggressive.  Is hard to tell whether or not it is a true dorsiflexion or if it is just the way the foot is sitting in this radiograph.  Assessment: I am just not convinced that she is doing very well at all I do not think this is going to be a good long-term for her the second metatarsophalangeal joint pain will resolve however the motion of the first metatarsophalangeal joint from the previous surgery as well as the surgery combined is more than likely left her with the inability to move that great toe.  Not only this but I feel that she is going to have chronic pain in this area.  Plan: Discussed etiology pathology conservative surgical therapies at this point I feel that a bone stimulator but may be necessary to  help heal this slight diastases at the first TMT joint where the arthrodesis was performed she says she has 1 at home and she would like to use that one if possible but has since called back to the office requesting an order for a bone stimulator.  I do think this is necessary so we will request an order for this.  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  I will recommend that she retire early she needs a different kind of job, recommend on that she is not working she cannot work on concrete floors this is obvious this is going to be a permanent disability for her am afraid.  I will follow-up with her in a couple of weeks and will call to work [employer] representative.

## 2020-02-11 NOTE — Telephone Encounter (Signed)
Ask Lytle Michaels if it is something we can do. I don't know if its an option.  Insurance may not pay.  You can let her know that you will attempt to ask tryey.

## 2020-02-15 ENCOUNTER — Encounter: Payer: BC Managed Care – PPO | Admitting: Podiatry

## 2020-02-16 DIAGNOSIS — H9209 Otalgia, unspecified ear: Secondary | ICD-10-CM | POA: Diagnosis not present

## 2020-02-16 DIAGNOSIS — S025XXS Fracture of tooth (traumatic), sequela: Secondary | ICD-10-CM | POA: Diagnosis not present

## 2020-02-16 DIAGNOSIS — K0501 Acute gingivitis, non-plaque induced: Secondary | ICD-10-CM | POA: Diagnosis not present

## 2020-02-16 DIAGNOSIS — L03211 Cellulitis of face: Secondary | ICD-10-CM | POA: Diagnosis not present

## 2020-02-16 NOTE — Telephone Encounter (Signed)
Can you see if Donley Redder with [employer] can help her get this set back up?

## 2020-02-22 ENCOUNTER — Telehealth: Payer: Self-pay | Admitting: *Deleted

## 2020-02-22 NOTE — Telephone Encounter (Signed)
Called and left Vmessage w/ Lytle Michaels ([employer]),to return call back to get patient  restarted on bone stimulator.

## 2020-02-22 NOTE — Telephone Encounter (Signed)
Spoke with Hanna(bone stimulator) and she has the new script and is waiting on department of labor on were to go from here. She has called and informed patient.

## 2020-02-22 NOTE — Telephone Encounter (Signed)
Notes given to [facility] yesterday... thanks Ammie!

## 2020-02-23 ENCOUNTER — Telehealth: Payer: Self-pay | Admitting: *Deleted

## 2020-02-23 NOTE — Telephone Encounter (Signed)
Patient is requesting another xray (trying to get approved for bone stimulator). The previous one is questionable, not sure if hardware is loosened.Please advise.

## 2020-02-23 NOTE — Telephone Encounter (Signed)
Scheduled patient for an appointment on 02/17

## 2020-02-25 ENCOUNTER — Ambulatory Visit (INDEPENDENT_AMBULATORY_CARE_PROVIDER_SITE_OTHER)

## 2020-02-25 ENCOUNTER — Other Ambulatory Visit: Payer: Self-pay

## 2020-02-25 ENCOUNTER — Ambulatory Visit: Admitting: *Deleted

## 2020-02-25 DIAGNOSIS — T84223D Displacement of internal fixation device of bones of foot and toes, subsequent encounter: Secondary | ICD-10-CM

## 2020-02-25 DIAGNOSIS — M79672 Pain in left foot: Secondary | ICD-10-CM

## 2020-02-25 NOTE — Progress Notes (Signed)
Patient presents to the office today needing an updated xray of her left foot. [employer] is in the process of getting her a bone stimulator and the last xray report was inconclusive.   Will let Dr. Milinda Pointer review xray and document findings.

## 2020-02-28 ENCOUNTER — Encounter: Payer: Self-pay | Admitting: *Deleted

## 2020-02-29 NOTE — Progress Notes (Signed)
Candice Estrada presents February 28, 2020 for a follow-up x-ray of her left foot.  Radiographs taken postoperatively once the cast was removed and compared to radiographs taken yesterday demonstrate what appears to be loosening of the screw at the arthrodesis site allowing for elevation of the first metatarsal and distraction of the plantar aspect of the first TMT joint arthrodesis site.  There is no hypertrophic bone growth.  The bone appears to not be healing at all and there is a diastases plantarly resulting in this elevation and jamming of the great toe.  I would have expected considerable bone bridging at this point since the initial surgery was performed in October 2021.  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  An [employer] bone growth stimulator would most likely allow for bony bridging and completing the arthrodesis.

## 2020-03-02 ENCOUNTER — Ambulatory Visit: Payer: BC Managed Care – PPO | Admitting: Podiatry

## 2020-03-09 ENCOUNTER — Ambulatory Visit: Payer: Self-pay

## 2020-03-09 ENCOUNTER — Ambulatory Visit (INDEPENDENT_AMBULATORY_CARE_PROVIDER_SITE_OTHER): Admitting: Podiatry

## 2020-03-09 ENCOUNTER — Other Ambulatory Visit: Payer: Self-pay

## 2020-03-09 DIAGNOSIS — M85872 Other specified disorders of bone density and structure, left ankle and foot: Secondary | ICD-10-CM | POA: Diagnosis not present

## 2020-03-09 DIAGNOSIS — M2012 Hallux valgus (acquired), left foot: Secondary | ICD-10-CM | POA: Diagnosis not present

## 2020-03-09 NOTE — Progress Notes (Signed)
She presents today for follow-up of her surgical foot left.  She states that she is still experiencing mild to moderate swelling in the left foot with pain on top of the foot within and within the arch.  She states that she has a small red area on the top of the foot overlying the second toe.  States that ever since her first surgery she was hesitant unable to extend her hallux.  She states that is very weak and spastic.  She states that the pain at her dorsal aspect of her second toe is originating from an area of her anterior ankle and states that when she presses on her anterior ankle she has pain overlying that toe.  This is the first time I heard of that.  She did state that there have been incisions in that area.  She states that she had stitches in that area.  And there are small scars noted.  Objective: Vital signs are stable she is alert and oriented xthree she has moderate edema today no erythema cellulitis drainage odor though she does have tenderness on palpation of the plantar medial aspect of the attempted fusion site of the Lapidus as well as the dorsal aspect of the incision overlying the second toe.  This is not as painful as it has been in the past but is still relatively tender.  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  I am also going to do blood work consisting of a CMP CBC arthritic panel B12 D3 parathormone and a hemoglobin A1c in hopes to see if there is anything that we need to treat with this.  Also we are still waiting for yes or no on the bone stimulator and we did discuss the possible need for placing a plantar dorsal screw.  She understands that if necessary.

## 2020-03-10 ENCOUNTER — Telehealth: Payer: Self-pay | Admitting: Podiatry

## 2020-03-10 ENCOUNTER — Other Ambulatory Visit: Payer: Self-pay | Admitting: Podiatry

## 2020-03-10 DIAGNOSIS — M85872 Other specified disorders of bone density and structure, left ankle and foot: Secondary | ICD-10-CM | POA: Diagnosis not present

## 2020-03-10 NOTE — Telephone Encounter (Signed)
A rep from [facility] calling to inform Dr Milinda Pointer one of the test ordered was written for [facility] and not [facility]. Please clarify by calling [phone].

## 2020-03-13 ENCOUNTER — Ambulatory Visit
Admission: RE | Admit: 2020-03-13 | Discharge: 2020-03-13 | Disposition: A | Discharge status: Home / Self Care | Payer: BC Managed Care – PPO | Source: Ambulatory Visit | Attending: Physician Assistant | Admitting: Physician Assistant

## 2020-03-13 ENCOUNTER — Other Ambulatory Visit: Payer: Self-pay | Admitting: Physician Assistant

## 2020-03-13 DIAGNOSIS — N200 Calculus of kidney: Secondary | ICD-10-CM | POA: Diagnosis not present

## 2020-03-13 DIAGNOSIS — Z8619 Personal history of other infectious and parasitic diseases: Secondary | ICD-10-CM | POA: Diagnosis not present

## 2020-03-13 DIAGNOSIS — R140 Abdominal distension (gaseous): Secondary | ICD-10-CM | POA: Diagnosis not present

## 2020-03-13 DIAGNOSIS — R109 Unspecified abdominal pain: Secondary | ICD-10-CM | POA: Diagnosis not present

## 2020-03-13 DIAGNOSIS — K5900 Constipation, unspecified: Secondary | ICD-10-CM

## 2020-03-13 DIAGNOSIS — Z8719 Personal history of other diseases of the digestive system: Secondary | ICD-10-CM | POA: Diagnosis not present

## 2020-03-13 NOTE — Telephone Encounter (Signed)
Did you take care of this?

## 2020-03-13 NOTE — Telephone Encounter (Signed)
Lab called Friday and I did verify test orders were correct.

## 2020-03-17 LAB — COMPREHENSIVE METABOLIC PANEL
ALT: 11 IU/L (ref 0–32)
AST: 20 IU/L (ref 0–40)
Albumin/Globulin Ratio: 1.6 (ref 1.2–2.2)
Albumin: 4.6 g/dL (ref 3.8–4.8)
Alkaline Phosphatase: 57 IU/L (ref 44–121)
BUN/Creatinine Ratio: 8 — ABNORMAL LOW (ref 9–23)
BUN: 8 mg/dL (ref 6–24)
Bilirubin Total: 0.5 mg/dL (ref 0.0–1.2)
CO2: 20 mmol/L (ref 20–29)
Calcium: 9.9 mg/dL (ref 8.7–10.2)
Chloride: 101 mmol/L (ref 96–106)
Creatinine, Ser: 0.99 mg/dL (ref 0.57–1.00)
GFR calc Af Amer: 79 mL/min/{1.73_m2} (ref >59)
GFR calc non Af Amer: 69 mL/min/{1.73_m2} (ref >59)
Globulin, Total: 2.9 g/dL (ref 1.5–4.5)
Glucose: 89 mg/dL (ref 65–99)
Potassium: 4.4 mmol/L (ref 3.5–5.2)
Sodium: 137 mmol/L (ref 134–144)
Total Protein: 7.5 g/dL (ref 6.0–8.5)

## 2020-03-17 LAB — CBC WITH DIFFERENTIAL/PLATELET
Basophils Absolute: 0 10*3/uL (ref 0.0–0.2)
Basos: 1 %
EOS (ABSOLUTE): 0.1 10*3/uL (ref 0.0–0.4)
Eos: 1 %
Hematocrit: 45.2 % (ref 34.0–46.6)
Hemoglobin: 14.8 g/dL (ref 11.1–15.9)
Immature Grans (Abs): 0 10*3/uL (ref 0.0–0.1)
Immature Granulocytes: 0 %
Lymphocytes Absolute: 1.6 10*3/uL (ref 0.7–3.1)
Lymphs: 28 %
MCH: 29.1 pg (ref 26.6–33.0)
MCHC: 32.7 g/dL (ref 31.5–35.7)
MCV: 89 fL (ref 79–97)
Monocytes Absolute: 0.4 10*3/uL (ref 0.1–0.9)
Monocytes: 7 %
Neutrophils Absolute: 3.6 10*3/uL (ref 1.4–7.0)
Neutrophils: 63 %
Platelets: 288 10*3/uL (ref 150–450)
RBC: 5.09 x10E6/uL (ref 3.77–5.28)
RDW: 11.9 % (ref 11.7–15.4)
WBC: 5.7 10*3/uL (ref 3.4–10.8)

## 2020-03-17 LAB — SEDIMENTATION RATE: Sed Rate: 4 mm/hr (ref 0–32)

## 2020-03-17 LAB — HGB A1C W/O EAG: Hgb A1c MFr Bld: 5.3 % (ref 4.8–5.6)

## 2020-03-17 LAB — ANA: ANA Titer 1: NEGATIVE

## 2020-03-17 LAB — C-REACTIVE PROTEIN: CRP: <1 mg/L (ref 0–10)

## 2020-03-17 LAB — VITAMIN D 25 HYDROXY (VIT D DEFICIENCY, FRACTURES): Vit D, 25-Hydroxy: 38.9 ng/mL (ref 30.0–100.0)

## 2020-03-17 LAB — VITAMIN B12: Vitamin B-12: 411 pg/mL (ref 232–1245)

## 2020-03-17 LAB — RHEUMATOID FACTOR: Rheumatoid fact SerPl-aCnc: <10 IU/mL (ref <14.0)

## 2020-03-17 LAB — PARATHYROID HORMONE, INTACT (NO CA): PTH: 23 pg/mL (ref 15–65)

## 2020-03-21 ENCOUNTER — Telehealth: Payer: Self-pay | Admitting: *Deleted

## 2020-03-21 ENCOUNTER — Other Ambulatory Visit: Payer: Self-pay | Admitting: Podiatry

## 2020-03-21 MED ORDER — VITAMIN D (ERGOCALCIFEROL) 1.25 MG (50000 UNIT) PO CAPS: 50000.0000 [IU] | ORAL_CAPSULE | ORAL | 0 refills | Status: DC

## 2020-03-21 NOTE — Telephone Encounter (Signed)
Rx sent to pharmacy and patient notified through [place].

## 2020-03-21 NOTE — Telephone Encounter (Signed)
-----   Message from Garrel Ridgel, [place] sent at 03/21/2020  6:48 AM EST ----- Vit D is on the low end of normal.  I think we need a boost to help with the bone healing.  Rx Vit D 50000 IU one a week for 5 weeks  We will retest soon.

## 2020-03-21 NOTE — Telephone Encounter (Signed)
Please advise 

## 2020-03-27 NOTE — Progress Notes (Addendum)
Cardiology Office Note    Date:  03/29/2020   ID:  Candice Estrada, DOB [birth year], MRN [id number]  PCP:  Alycia Rossetti, MD  Cardiologist:  Sherren Mocha, MD  Electrophysiologist:  Virl Axe, MD   Chief Complaint: 12 month follow-up prior ASD closure  History of Present Illness:   Candice Estrada is a 45 y.o. female with history of transcatheter ASD closure in 2012 after presenting with a TIA in the setting of a small ostium secundum ASD, POTS/NSVT (followed by Dr. Caryl Comes) with diagnosis complicated by semicircular canal dehiscence, migraines, B12 deficiency, ocular surface squamous neoplasia, SBO 06/2017 who presents for routine follow-up.  She was remotely followed by Dr. Burt Knack for ASD closure but most of her care has been with Dr. Caryl Comes with unusual presentations of recurrent palpitations and near-syncope. Dr. 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 Regarding h/o palpitations and near syncope, Florinef, proamantine, and beta blocker have not been helpful. Her symptoms were previously aggravated by temperature changes and menses. Remote signal average EKG was normal. Zio patch in 2015 showed PACs and 4 beat run NSVT. Monitor in 12/2015 showed isolated PACs and one ventricular couplet noted; symptoms correlated with NSR though. 2D echo 07/2013 showed EF 55-60%, ASD closure appeared normal. MRI of the brain in 02/2015 was normal. 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  Stress echo 01/2018 was normal except she did have hypotension at peak stress. I reviewed with Dr. Burt Knack and this was felt more of an autonomic issue than ischemia as EKG was normal and she had no anginal symptoms. Monitor 01/2018 showed NSR with NSVT. F/u SAECG and cardiac MRI were arranged per Dr. Olin Pia request. Her repeat  SAECG  03/2018 was reported to be normal. Cardiac MRI 03/2018 showing normal RV/LV, no scar/infiltration, and recommendation to f/u ASD with 2D echo.  2D Echo 06/2018 showed EF 60-65%, bubble study negative without convincing color flow across the ASD per Dr. Antionette Char review. She last saw our office 03/2019 [facility] Dopp) and reported some very atypical chest discomfort with family history of CAD. Calcium score 03/2019 was 0 so no further workup pursued. This has since resolved. Per CareEverywhere notes she then saw [facility] 03/2019 to establish care and it appears this was primarily a records review visit. The patient states she had been sent here for Worker's Comp following an episode of dizziness at work. She was intended to follow up there but did not. She prefers to continue care here.  She is seen back for follow-up overall stable from cardiac standpoint compared to prior years. Atypical chest pain is no longer an issue - she thinks this was due to stress. She continues to have episodic dizziness that is sometimes positional, sometimes not. Some of the dizziness she knows is still related to the semicircular canal issue as it will be associated with hearing changes. Other episodes don't seem as typical for this. This is the same thing she has been experiencing for years. She has not checked her BP during these episodes. She has occasional palpitations (without acceleration from prior) but they are not associated with the dizziness. She's been following with another specialist for continued complications after her foot surgery. She was diagnosed with avascular necrosis of the bone and was told to mention her issue of non-healing to  our clinic today. Orthostatics negative today with regard to BP change or hypotension (SBP range 110 sitting to 119 standing) but did elevate HR 60->79. She is also seeing GI for constipation issues.   Labwork independently reviewed: 02/2020 CMET wnl K 4.4, CBC wnl 2020 TSH wnl, Mg 2.1 2018  LDL 86  Past Medical History:  Diagnosis Date  . Anxiety   . ASD (atrial septal defect), ostium secundum    Status post closure 08/20/10 with Dr. Burt Knack; procedure complicated by right groin hematoma and acute blood loss anemia  . Atypical squamous cell changes of undetermined significance (ASCUS) on cervical cytology with negative high risk human papilloma virus (HPV) test result 08/04/2019   07/2019 repeat pap in 3 years per ASCCP guidelines 5 year CIN 3+ risk 0.27%  . BV (bacterial vaginosis) 10/06/2014  . Complication of anesthesia    VASO-VAGAL RESPONSE IN RECOVERY ROOM AFTER HEART SURGERY  . Hematuria   . History of psoriasis    scalp  . History of TIAs   . Hx of Coronary Ca Score 03/2019   CT 03/2019: Cor Calcium Score 0  . Hx of dizziness    random episodes "because my BP is so low"  . Hypotension   . IUD (intrauterine device) in place 12/16/2012  . Kidney stone   . Meniere's disease   . Night terrors   . NSVT (nonsustained ventricular tachycardia) (HCC)    a. 4 beat run on Zio 2015.  Marland Kitchen Ocular neoplasm   . Ovarian cyst   . POTS (postural orthostatic tachycardia syndrome)   . SBO (small bowel obstruction) ([facility])   . 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 Thyroid enlarged 12/16/2012   History of cyst will get [place]    Past Surgical History:  Procedure Laterality Date  . ANKLE ARTHROSCOPY WITH RECONSTRUCTION Left 02/19/2018   Procedure: LEFT ANKLE ARTHROSCOPIC DEBRIDEMENT, LATERAL LIGAMENT RECONSTRUCTION,  OSTEOCHONDRAL TREATMENT;  Surgeon: Erle Crocker, MD;  Location: [facility];  Service: Orthopedics;  Laterality: Left;  . ASD REPAIR  08/20/2010  . CARDIAC SURGERY    . CYSTOSCOPY WITH RETROGRADE PYELOGRAM, URETEROSCOPY AND STENT PLACEMENT Left 08/31/2014   Procedure: CYSTOSCOPY WITH BILATERAL RETROGRADE PYELOGRAM, URETEROSCOPY AND STENT PLACEMENT, STONE EXTRACTION WITH BASKET;  Surgeon: Cleon Gustin, MD;  Location: [facility];  Service:  Urology;  Laterality: Left;  . FOOT NEUROMA SURGERY  2009   left  . kidney stone removed    . THYROID CYST EXCISION    . TONSILLECTOMY    . vascular necrosis left foot      Current Medications: Current Meds  Medication Sig  . buPROPion (WELLBUTRIN SR) 150 MG 12 hr tablet Take 1 tablet (150 mg total) by mouth daily.  Marland Kitchen ibuprofen (ADVIL) 800 MG tablet Take 1 tablet (800 mg total) by mouth every 8 (eight) hours as needed.  . linaclotide (LINZESS) 145 MCG CAPS capsule Take 145 mcg by mouth daily before breakfast.  . meclizine (ANTIVERT) 25 MG tablet 1 tablet by mouth twice a day as needed for vertigo  . Melatonin 3 MG TABS Take 1 tablet by mouth daily.  . Multiple Vitamin (MULTIVITAMIN WITH MINERALS) TABS Take 1 tablet by mouth every morning.   . pantoprazole (PROTONIX) 40 MG tablet Take 40 mg by mouth daily.  Marland Kitchen PARAGARD INTRAUTERINE COPPER IU by Intrauterine route.  . Vitamin D, Ergocalciferol, (DRISDOL) 1.25 MG (50000 UNIT) CAPS capsule Take 1 capsule (50,000 Units total) by mouth every 7 (  seven) days.      Allergies:   Patient has no known allergies.   Social History   Socioeconomic History  . Marital status: Married    Spouse name: Not on file  . Number of children: 2  . Years of education: College  . Highest education level: Not on file  Occupational History  . 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 Smoking status: Never Smoker  . Smokeless tobacco: Never Used  Vaping Use  . Vaping Use: Never used  Substance and Sexual Activity  . Alcohol use: Yes    Alcohol/week: 0.0 standard drinks    Comment: very rarely  . Drug use: No  . Sexual activity: Yes    Birth control/protection: I.U.D.  Other Topics Concern  . Not on file  Social History Narrative   Patient lives at home with her family.   Caffeine Use: rarely   Social Determinants of Health   Financial Resource Strain: Low Risk   . Difficulty of  Paying Living Expenses: Not very hard  Food Insecurity: No Food Insecurity  . Worried About Charity fundraiser in the Last Year: Never true  . Ran Out of Food in the Last Year: Never true  Transportation Needs: No Transportation Needs  . Lack of Transportation (Medical): No  . Lack of Transportation (Non-Medical): No  Physical Activity: Sufficiently Active  . Days of Exercise per Week: 7 days  . Minutes of Exercise per Session: 40 min  Stress: Stress Concern Present  . Feeling of Stress : To some extent  Social Connections: Socially Integrated  . Frequency of Communication with Friends and Family: More than three times a week  . Frequency of Social Gatherings with Friends and Family: Once a week  . Attends Religious Services: 1 to 4 times per year  . Active Member of Clubs or Organizations: No  . Attends Archivist Meetings: More than 4 times per year  . Marital Status: Married     Family History:  The patient's family history includes Brain cancer in her father; Cancer in her maternal grandmother, mother, paternal aunt, paternal grandmother, and another family member; Dementia in her maternal grandmother; Diabetes in her father; Heart attack (age of onset: 57) in her father; Heart disease in her father; Hypertension (age of onset: 21) in her brother; Hypertension (age of onset: 24) in her father; Kidney failure in her daughter; Liver disease (age of onset: 29) in her mother; Stroke in her father.  ROS:   Please see the history of present illness. Otherwise, review of systems is positive for constipation, spotty BRBPR - has been in touch with GI for this. Started Linzess, pending callback from them about whether colonoscopy is needed. All other systems are reviewed and otherwise negative.    EKGs/Labs/Other Studies Reviewed:    Studies reviewed are outlined and summarized above. Reports included below if pertinent.  2D echo 06/2018   1. The left ventricle has normal  systolic function, with an ejection  fraction of 60-65%. The cavity size was normal. Left ventricular diastolic  parameters were normal. No evidence of left ventricular regional wall  motion abnormalities.  2. The right ventricle has normal systolc function. The cavity was  normal. There is no increase in right ventricular wall thickness. Right  ventricular systolic pressure could not be assessed.  3. No evidence of mitral valve stenosis.  4. The aortic valve is  tricuspid No stenosis of the aortic valve.  5. The ascending aorta and aortic arch are normal in size and structure.  6. S/P ASD closure device. Device appears well seated, does not rick.  Cannot exclude trivial left to right flow at the aspect of the device  nearest the tricuspid/mitral annulus. Bubble study negative for right to  left flow both at rest and with cough  provocation.   Event monitor 09/2018 Findings No ventricular ectopy Occasional PAC One triggered event ( did she remember that?) assoc with normal sinus rhythm        EKG:  EKG is ordered today, personally reviewed, demonstrating NSR 61bpm, no acute STT changes, QTc wnl  Recent Labs: 03/10/2020: ALT 11; BUN 8; Creatinine, Ser 0.99; Hemoglobin 14.8; Platelets 288; Potassium 4.4; Sodium 137  Recent Lipid Panel    Component Value Date/Time   CHOL 158 10/21/2016 1010   TRIG 63 10/21/2016 1010   HDL 59 10/21/2016 1010   CHOLHDL 2.7 10/21/2016 1010   CHOLHDL 2.8 09/09/2012 0910   VLDL 15 09/09/2012 0910   LDLCALC 86 10/21/2016 1010    PHYSICAL EXAM:    VS:  BP 102/62   Pulse 61   Ht 5' 3"  (1.6 m)   Wt 148 lb (67.1 kg)   SpO2 98%   BMI 26.22 kg/m   BMI: Body mass index is 26.22 kg/m.  GEN: Well nourished, well developed female in no acute distress HEENT: normocephalic, atraumatic Neck: no JVD, carotid bruits, or masses Cardiac: RRR; no murmurs, rubs, or gallops, no edema, pedal pulses challenging to feel Respiratory:  clear to auscultation  bilaterally, normal work of breathing GI: soft, nontender, nondistended, + BS MS: no deformity or atrophy Skin: warm and dry, no rash Neuro:  Alert and Oriented x 3, Strength and sensation are intact, follows commands Psych: euthymic mood, full affect  Wt Readings from Last 3 Encounters:  03/29/20 148 lb (67.1 kg)  07/29/19 157 lb 9.6 oz (71.5 kg)  05/28/19 156 lb (70.8 kg)     ASSESSMENT & PLAN:   1. Chronic dizziness and history of ASD closure - continue to have issues with dizziness with multitude of factors previously felt to be contributing as outlined above. She has tendency for low-normal BP so does not have much reserve there. As above, Florinef, midodrine and beta blocker have not previously been helpful. Orthostatics were negative for hypotensive drop but do show HR elevation from 60->79. She did not yet eat today. We discussed use of compression hose (if she can get clearance for ortho for these) and liberalizing salt and fluid intake. I did discuss her history with Dr. Burt Knack and we feel it is prudent to arrange f/u limited 2D echo with bubble study to evaluate the integrity of her closure. Per our review, she is out of the 6 month window for SBE ppx need.  2. NSVT - still with sporadic palpitations but not correlating with symptoms of dizziness. Prior SAECG normal and CMRI without revealing findings. Will arrange follow-up with EP Dr. Caryl Comes (last OV 10/2018).  3. History of TIA - previously occurred in context of ASD, which was subsequently closed. She is overdue for lipid check. Previously followed by PCP but last obtained 2018 that I can see. Offered to obtain in clinic today. She politely declined as she sees her OB GYN tomorrow for a routine physical and anticipates this will be checked at that visit.   4. Avascular necrosis of foot - ortho raised question of  non-healing per patient. Will arrange non-invasive vascular testing to exclude arterial insufficiency.  Disposition:  Will arrange f/u with Dr. Caryl Comes to follow up NSVT. Further follow-up with general cardiology can be dictated based on this appointment - may be reasonable to see EP only going forward given that she has not seen Dr. Burt Knack since 2014.  Medication Adjustments/Labs and Tests Ordered: Current medicines are reviewed at length with the patient today.  Concerns regarding medicines are outlined above. Medication changes, Labs and Tests ordered today are summarized above and listed in the Patient Instructions accessible in Encounters.   Signed, Charlie Pitter, PA-C  03/29/2020 10:12 AM    [address], [place], [place]  [address] Phone: [phone]; Fax: [phone]

## 2020-03-29 ENCOUNTER — Encounter: Payer: Self-pay | Admitting: Physician Assistant

## 2020-03-29 ENCOUNTER — Ambulatory Visit (INDEPENDENT_AMBULATORY_CARE_PROVIDER_SITE_OTHER): Payer: BC Managed Care – PPO | Admitting: Physician Assistant

## 2020-03-29 ENCOUNTER — Other Ambulatory Visit: Payer: Self-pay

## 2020-03-29 VITALS — BP 102/62 | HR 61 | Ht 63.0 in | Wt 148.0 lb

## 2020-03-29 DIAGNOSIS — I472 Ventricular tachycardia: Secondary | ICD-10-CM

## 2020-03-29 DIAGNOSIS — Z8673 Personal history of transient ischemic attack (TIA), and cerebral infarction without residual deficits: Secondary | ICD-10-CM | POA: Diagnosis not present

## 2020-03-29 DIAGNOSIS — R42 Dizziness and giddiness: Secondary | ICD-10-CM

## 2020-03-29 DIAGNOSIS — Z8774 Personal history of (corrected) congenital malformations of heart and circulatory system: Secondary | ICD-10-CM | POA: Diagnosis not present

## 2020-03-29 DIAGNOSIS — I4729 Other ventricular tachycardia: Secondary | ICD-10-CM

## 2020-03-29 DIAGNOSIS — R0789 Other chest pain: Secondary | ICD-10-CM

## 2020-03-29 DIAGNOSIS — M87076 Idiopathic aseptic necrosis of unspecified foot: Secondary | ICD-10-CM

## 2020-03-29 NOTE — Patient Instructions (Signed)
Medication Instructions:  Your physician recommends that you continue on your current medications as directed. Please refer to the Current Medication list given to you today.  *If you need a refill on your cardiac medications before your next appointment, please call your pharmacy*   Lab Work: None ordered  If you have labs (blood work) drawn today and your tests are completely normal, you will receive your results only by: Marland Kitchen MyChart Message (if you have MyChart) OR . A paper copy in the mail If you have any lab test that is abnormal or we need to change your treatment, we will call you to review the results.   Testing/Procedures: Your physician has requested that you have an echocardiogram with bubble study. Echocardiography is a painless test that uses sound waves to create images of your heart. It provides your doctor with information about the size and shape of your heart and how well your heart's chambers and valves are working. This procedure takes approximately one hour. There are no restrictions for this procedure.   Your physician has requested that you have a lower or upper extremity arterial duplex. This test is an ultrasound of the arteries in the legs or arms. It looks at arterial blood flow in the legs and arms. Allow one hour for Lower and Upper Arterial scans. There are no restrictions or special instructions   Follow-Up: At [facility], you and your health needs are our priority.  As part of our continuing mission to provide you with exceptional heart care, we have created designated Provider Care Teams.  These Care Teams include your primary Cardiologist (physician) and Advanced Practice Providers (APPs -  Physician Assistants and Nurse Practitioners) who all work together to provide you with the care you need, when you need it.  We recommend signing up for the patient portal called "MyChart".  Sign up information is provided on this After Visit Summary.  MyChart is used  to connect with patients for Virtual Visits (Telemedicine).  Patients are able to view lab/test results, encounter notes, upcoming appointments, etc.  Non-urgent messages can be sent to your provider as well.   To learn more about what you can do with MyChart, go to [email or web address].    Your next appointment:   Next Available   The format for your next appointment:   In Person  Provider:   Virl Axe, MD   Other Instructions

## 2020-03-30 ENCOUNTER — Encounter: Payer: Self-pay | Admitting: Advanced Practice Midwife

## 2020-03-30 ENCOUNTER — Ambulatory Visit (INDEPENDENT_AMBULATORY_CARE_PROVIDER_SITE_OTHER): Payer: BC Managed Care – PPO | Admitting: Advanced Practice Midwife

## 2020-03-30 VITALS — BP 106/71 | HR 112 | Ht 63.0 in | Wt 147.0 lb

## 2020-03-30 DIAGNOSIS — Z30430 Encounter for insertion of intrauterine contraceptive device: Secondary | ICD-10-CM

## 2020-03-30 DIAGNOSIS — Z0000 Encounter for general adult medical examination without abnormal findings: Secondary | ICD-10-CM | POA: Insufficient documentation

## 2020-03-30 DIAGNOSIS — Z30432 Encounter for removal of intrauterine contraceptive device: Secondary | ICD-10-CM | POA: Diagnosis not present

## 2020-03-30 LAB — POCT URINE PREGNANCY: Preg Test, Ur: NEGATIVE

## 2020-03-30 MED ORDER — PARAGARD INTRAUTERINE COPPER IU IUD: INTRAUTERINE_SYSTEM | Freq: Once | INTRAUTERINE | Status: AC

## 2020-03-30 NOTE — Patient Instructions (Signed)
IUD PLACEMENT POST-PROCEDURE INSTRUCTIONS  1. You may take Ibuprofen, Aleve or Tylenol for pain if needed.  Cramping should resolve within in 24 hours.  2. You may have a small amount of spotting.  You should wear a mini pad for the next few days  3.    Nothing in the vagina for 3 days (tampons or intercourse)  4.  You need to call if you have a fever (more than 100.4), any moderate to severe pelvic pain, fever, or foul smelling vaginal discharge.  Irregular bleeding is common the first several months after having an IUD placed. You do not need to call for this reason unless you are concerned.  5. Shower or bathe as normal  6.  You should have a follow-up appointment in 4-8 weeks for a re-check to make sure you are not having any problems.

## 2020-03-30 NOTE — Progress Notes (Signed)
Candice Estrada is a 45 y.o. year old Caucasian female   who presents for removal and replacement of her Paragard IUD. The new IUD is also Paragard. It has been 10 years since her previous IUD placement.  The risks and benefits of the method and placement have been thouroughly reviewed with the patient and all questions were answered.  Specifically the patient is aware of failure rate of 01/998, expulsion of the IUD and of possible perforation.  The patient is aware of irregular bleeding due to the method and understands the incidence of irregular bleeding diminishes with time.  Time out was performed.  A Graves speculum was placed.  The cervix was prepped using Betadine. The strings were found to be  visible.   They were grasped and the IUD was easily removed. The cervix was then grasped with a tenaculum and the uterus was sounded to 8 cm. The IUD was inserted to 8 cm.  It was pulled back 1 cm and the IUD was disengaged. After 15 seconds, the IUD was placed in the fundus.  The strings were trimmed to 3 cm.  Sonogram was performed and the proper placement of the IUD was verified.  The patient was instructed on signs and symptoms of infection and to check for the strings after each menses or each month.  The patient is to refrain from intercourse for 3 days.

## 2020-04-05 ENCOUNTER — Other Ambulatory Visit: Payer: Self-pay

## 2020-04-05 ENCOUNTER — Telehealth: Payer: Self-pay | Admitting: Physician Assistant

## 2020-04-05 ENCOUNTER — Ambulatory Visit (HOSPITAL_COMMUNITY): Payer: BC Managed Care – PPO | Attending: Internal Medicine

## 2020-04-05 DIAGNOSIS — I472 Ventricular tachycardia: Secondary | ICD-10-CM

## 2020-04-05 DIAGNOSIS — I4891 Unspecified atrial fibrillation: Secondary | ICD-10-CM | POA: Diagnosis not present

## 2020-04-05 DIAGNOSIS — M87076 Idiopathic aseptic necrosis of unspecified foot: Secondary | ICD-10-CM | POA: Insufficient documentation

## 2020-04-05 DIAGNOSIS — R109 Unspecified abdominal pain: Secondary | ICD-10-CM | POA: Diagnosis not present

## 2020-04-05 DIAGNOSIS — K625 Hemorrhage of anus and rectum: Secondary | ICD-10-CM | POA: Diagnosis not present

## 2020-04-05 DIAGNOSIS — Z8673 Personal history of transient ischemic attack (TIA), and cerebral infarction without residual deficits: Secondary | ICD-10-CM | POA: Diagnosis not present

## 2020-04-05 DIAGNOSIS — Z8774 Personal history of (corrected) congenital malformations of heart and circulatory system: Secondary | ICD-10-CM

## 2020-04-05 DIAGNOSIS — I4729 Other ventricular tachycardia: Secondary | ICD-10-CM

## 2020-04-05 DIAGNOSIS — R42 Dizziness and giddiness: Secondary | ICD-10-CM

## 2020-04-05 DIAGNOSIS — Z86010 Personal history of colonic polyps: Secondary | ICD-10-CM | POA: Diagnosis not present

## 2020-04-05 DIAGNOSIS — K5900 Constipation, unspecified: Secondary | ICD-10-CM | POA: Diagnosis not present

## 2020-04-05 LAB — ECHOCARDIOGRAM LIMITED BUBBLE STUDY
Area-P 1/2: 3.99 cm2
S' Lateral: 2.8 cm

## 2020-04-05 NOTE — Telephone Encounter (Signed)
    Pt is returning call from [facility] for her echo result

## 2020-04-05 NOTE — Telephone Encounter (Signed)
Patient was returning phone call

## 2020-04-06 NOTE — Telephone Encounter (Signed)
Echo results reviewed in detail with patient. Questions were answered to her satisfaction. She has a specific question about atrial septal thickness that was brought up by the echo sonographer during the exam. I advised that Dr. Caryl Comes and Melina Copa, PA have reviewed the results and agree with the reading. Advised her to address her specific question with Dr. Caryl Comes when she sees him in May. She verbalized understanding and agreement and thanked me for the call.

## 2020-04-06 NOTE — Telephone Encounter (Signed)
Patient calling back for echo results. She would like to know if another nurse can call to discuss her results today.

## 2020-04-07 ENCOUNTER — Other Ambulatory Visit: Payer: Self-pay

## 2020-04-07 ENCOUNTER — Ambulatory Visit (HOSPITAL_COMMUNITY)
Admission: RE | Admit: 2020-04-07 | Discharge: 2020-04-07 | Disposition: A | Discharge status: Home / Self Care | Payer: BC Managed Care – PPO | Source: Ambulatory Visit | Attending: Cardiology | Admitting: Cardiology

## 2020-04-07 DIAGNOSIS — R109 Unspecified abdominal pain: Secondary | ICD-10-CM | POA: Diagnosis not present

## 2020-04-07 DIAGNOSIS — Z8774 Personal history of (corrected) congenital malformations of heart and circulatory system: Secondary | ICD-10-CM | POA: Diagnosis not present

## 2020-04-07 DIAGNOSIS — M87076 Idiopathic aseptic necrosis of unspecified foot: Secondary | ICD-10-CM | POA: Diagnosis not present

## 2020-04-07 DIAGNOSIS — K5900 Constipation, unspecified: Secondary | ICD-10-CM | POA: Diagnosis not present

## 2020-04-07 DIAGNOSIS — Z8673 Personal history of transient ischemic attack (TIA), and cerebral infarction without residual deficits: Secondary | ICD-10-CM | POA: Diagnosis not present

## 2020-04-07 DIAGNOSIS — R42 Dizziness and giddiness: Secondary | ICD-10-CM | POA: Diagnosis not present

## 2020-04-07 DIAGNOSIS — I472 Ventricular tachycardia: Secondary | ICD-10-CM | POA: Diagnosis not present

## 2020-04-07 DIAGNOSIS — M87078 Idiopathic aseptic necrosis of left toe(s): Secondary | ICD-10-CM | POA: Diagnosis not present

## 2020-04-07 DIAGNOSIS — I4729 Other ventricular tachycardia: Secondary | ICD-10-CM

## 2020-04-10 ENCOUNTER — Other Ambulatory Visit: Payer: Self-pay | Admitting: Physician Assistant

## 2020-04-10 DIAGNOSIS — R1032 Left lower quadrant pain: Secondary | ICD-10-CM

## 2020-04-10 DIAGNOSIS — R1012 Left upper quadrant pain: Secondary | ICD-10-CM

## 2020-04-13 ENCOUNTER — Ambulatory Visit (INDEPENDENT_AMBULATORY_CARE_PROVIDER_SITE_OTHER): Payer: BC Managed Care – PPO | Admitting: Podiatry

## 2020-04-13 ENCOUNTER — Ambulatory Visit (INDEPENDENT_AMBULATORY_CARE_PROVIDER_SITE_OTHER): Payer: BC Managed Care – PPO

## 2020-04-13 ENCOUNTER — Other Ambulatory Visit: Payer: Self-pay

## 2020-04-13 DIAGNOSIS — M85872 Other specified disorders of bone density and structure, left ankle and foot: Secondary | ICD-10-CM | POA: Diagnosis not present

## 2020-04-13 DIAGNOSIS — R000 Tachycardia, unspecified: Secondary | ICD-10-CM | POA: Diagnosis not present

## 2020-04-13 DIAGNOSIS — M2012 Hallux valgus (acquired), left foot: Secondary | ICD-10-CM

## 2020-04-13 DIAGNOSIS — R002 Palpitations: Secondary | ICD-10-CM | POA: Diagnosis not present

## 2020-04-13 DIAGNOSIS — T84223D Displacement of internal fixation device of bones of foot and toes, subsequent encounter: Secondary | ICD-10-CM

## 2020-04-13 DIAGNOSIS — M21962 Unspecified acquired deformity of left lower leg: Secondary | ICD-10-CM

## 2020-04-13 DIAGNOSIS — Q211 Atrial septal defect: Secondary | ICD-10-CM | POA: Diagnosis not present

## 2020-04-13 DIAGNOSIS — Z9889 Other specified postprocedural states: Secondary | ICD-10-CM

## 2020-04-13 NOTE — Progress Notes (Signed)
She presents today states that physical therapy seems to be helping her left foot.  Date of surgery October 22, 2019 status post plantar flex 3 Lapidus procedure she does have some distraction of the joint plantarly which we have been treating.  All in all she states that she seems to be doing better I with exquisite tenderness around the first metatarsal medial cuneiform joint..  She goes on to state that she has finished her B12 is currently taking 10,000 mg a day.  She goes on to state that she had vascular studies done which demonstrated that she does have some vascular problems to her right foot not her left also if she is having a CT tomorrow for her abdomen stating that she has not been having regular bowel movements she is discussing malnutrition with me she is also discussing hypoglycemia.  As well as some hypotension.  States that she is not eating or drinking enough.  Objective: Vital signs are stable alert oriented x3.  Pulses are palpable.  Left foot demonstrates much decrease in edema no erythema no cellulitis drainage or odor she still has tenderness on palpation of the plantar medial aspect of the first TMT joint at the fusion site.  Otherwise the arthroplasty site at the second metatarsophalangeal joint is doing quite well.  Radiographs taken today demonstrate well healing fusion dorsally however is becoming a hypertrophic nonunion plantarly.  Internal fixation is intact.  Assessment chronic slow healing Lapidus procedure I do not know that it would heal without a bone stimulator.  Plan: Discussed etiology pathology and surgical therapies going to send her for continuing for physical therapy and also going to recommend that she go ahead and get started on her 10,000 mg of B12 daily and I am going to get more blood work done.  I like to follow-up with her in another month and see how she is doing.  Were also going to talk to the bone stimulator rep and let her know that she was declined  because of having had a bone stimulator previously.

## 2020-04-14 ENCOUNTER — Ambulatory Visit
Admission: RE | Admit: 2020-04-14 | Discharge: 2020-04-14 | Disposition: A | Discharge status: Home / Self Care | Payer: BC Managed Care – PPO | Source: Ambulatory Visit | Attending: Physician Assistant | Admitting: Physician Assistant

## 2020-04-14 DIAGNOSIS — R1032 Left lower quadrant pain: Secondary | ICD-10-CM

## 2020-04-14 DIAGNOSIS — K5900 Constipation, unspecified: Secondary | ICD-10-CM | POA: Diagnosis not present

## 2020-04-14 DIAGNOSIS — Z8719 Personal history of other diseases of the digestive system: Secondary | ICD-10-CM | POA: Diagnosis not present

## 2020-04-14 DIAGNOSIS — J9811 Atelectasis: Secondary | ICD-10-CM | POA: Diagnosis not present

## 2020-04-14 DIAGNOSIS — R1012 Left upper quadrant pain: Secondary | ICD-10-CM

## 2020-04-14 DIAGNOSIS — R109 Unspecified abdominal pain: Secondary | ICD-10-CM | POA: Diagnosis not present

## 2020-04-14 MED ORDER — IOPAMIDOL (ISOVUE-300) INJECTION 61%
100.0000 mL | Freq: Once | INTRAVENOUS | Status: AC | PRN
  Administered 2020-04-14: 100 mL via INTRAVENOUS

## 2020-04-17 DIAGNOSIS — Z029 Encounter for administrative examinations, unspecified: Secondary | ICD-10-CM

## 2020-04-18 DIAGNOSIS — M79676 Pain in unspecified toe(s): Secondary | ICD-10-CM

## 2020-04-20 ENCOUNTER — Telehealth: Payer: Self-pay | Admitting: Physician Assistant

## 2020-04-20 NOTE — Telephone Encounter (Signed)
    Kristine RN with [facility], Dr. Venetia Maxon, she said she tried looking up pt's echo on power share but can't see the image. She wanted to request if we don't use power share to send them a copy of pt's echo done on 04/05/20 on a disk on a dicom format send to them she gave address: [address] [place] [address] and ATTN: Dr Venetia Maxon

## 2020-04-25 ENCOUNTER — Telehealth (HOSPITAL_COMMUNITY): Payer: Self-pay | Admitting: Radiology

## 2020-04-25 NOTE — Telephone Encounter (Signed)
Call and spoke with Minette Headland at [facility] [phone]. She has received patient's images via Powershare. 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

## 2020-05-02 NOTE — Telephone Encounter (Signed)
Left message will work you at 11 am tomorrow

## 2020-05-03 ENCOUNTER — Encounter: Payer: Self-pay | Admitting: Adult Health

## 2020-05-03 ENCOUNTER — Other Ambulatory Visit: Payer: Self-pay

## 2020-05-03 ENCOUNTER — Ambulatory Visit (INDEPENDENT_AMBULATORY_CARE_PROVIDER_SITE_OTHER): Payer: BC Managed Care – PPO | Admitting: Adult Health

## 2020-05-03 VITALS — BP 108/64 | HR 80 | Ht 63.0 in | Wt 149.2 lb

## 2020-05-03 DIAGNOSIS — N907 Vulvar cyst: Secondary | ICD-10-CM | POA: Diagnosis not present

## 2020-05-03 MED ORDER — SULFAMETHOXAZOLE-TRIMETHOPRIM 800-160 MG PO TABS: 1.0000 | ORAL_TABLET | Freq: Two times a day (BID) | ORAL | 0 refills | Status: DC

## 2020-05-03 NOTE — Progress Notes (Signed)
  Subjective:     Patient ID: Candice Estrada, female   DOB: 1975/08/05, 45 y.o.   MRN: [id number]  HPI Candice Estrada is a 45 year old white fmale, married, [id number], in complaining of sore cyst on labia. It is better today, after warm bath and she squeezed it. PCP is Dr Caryl Comes.  Review of Systems Sore cyst right labia Reviewed past medical,surgical, social and family history. Reviewed medications and allergies.     Objective:   Physical Exam BP 108/64 (BP Location: Right Arm, Patient Position: Sitting, Cuff Size: Normal)   Pulse 80   Ht 5' 3"  (1.6 m)   Wt 149 lb 3.2 oz (67.7 kg)   LMP 04/09/2020 (Exact Date)   BMI 26.43 kg/m  Skin warm and dry.Pelvic: external genitalia is normal in appearance no lesions, vagina, on right inner labia near introitus white pimple like area, when pressed white material came out  Upstream - 05/03/20 1022      Pregnancy Intention Screening   Does the patient want to become pregnant in the next year? No    Does the patient's partner want to become pregnant in the next year? No    Would the patient like to discuss contraceptive options today? No      Contraception Wrap Up   Current Method IUD or IUS    End Method IUD or IUS    Contraception Counseling Provided No         Exam without chaperone with her permission.     Assessment:     1. Sebaceous cyst of labia Will rx septra ds for any possible infection Meds ordered this encounter  Medications  . sulfamethoxazole-trimethoprim (BACTRIM DS) 800-160 MG tablet    Sig: Take 1 tablet by mouth 2 (two) times daily. Take 1 bid    Dispense:  14 tablet    Refill:  0    Order Specific Question:   Supervising Provider    Answer:   Elonda Husky, LUTHER H [[id number]]     Continue warm baths, do not squeeze any more  Plan:     Follow up prn

## 2020-05-04 ENCOUNTER — Telehealth: Payer: Self-pay | Admitting: Internal Medicine

## 2020-05-04 NOTE — Telephone Encounter (Signed)
Received paperwork via fax. 39 Statement paperwork put in Dr. Olin Pia box for completion.  AO 05/04/20

## 2020-05-11 ENCOUNTER — Other Ambulatory Visit: Payer: Self-pay | Admitting: Adult Health

## 2020-05-15 ENCOUNTER — Other Ambulatory Visit: Payer: Self-pay

## 2020-05-15 MED ORDER — IBUPROFEN 800 MG PO TABS: 800.0000 mg | ORAL_TABLET | Freq: Three times a day (TID) | ORAL | 4 refills | Status: DC | PRN

## 2020-05-16 ENCOUNTER — Other Ambulatory Visit: Payer: Self-pay

## 2020-05-16 ENCOUNTER — Encounter: Payer: Self-pay | Admitting: Podiatry

## 2020-05-16 ENCOUNTER — Ambulatory Visit (INDEPENDENT_AMBULATORY_CARE_PROVIDER_SITE_OTHER): Payer: BC Managed Care – PPO

## 2020-05-16 ENCOUNTER — Ambulatory Visit (INDEPENDENT_AMBULATORY_CARE_PROVIDER_SITE_OTHER): Payer: BC Managed Care – PPO | Admitting: Podiatry

## 2020-05-16 DIAGNOSIS — M778 Other enthesopathies, not elsewhere classified: Secondary | ICD-10-CM

## 2020-05-16 NOTE — Progress Notes (Signed)
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  She is just received her bone stimulator and has started using that over the last 3 days.  She is stating that she has exquisite pain beneath the second and third metatarsals of the left foot.  It is less painful today than it has been recently.  Denies any trauma to the foot leg.  States that she can hardly stand on the foot for more than 5 to 10 minutes without a swelling and becoming painful.  Objective: Vital signs are stable she alert oriented x3.  Pulses are palpable.  She has mild edema overlying the dorsal aspect of the left foot though she has good range of motion of the second and third toes the majority of her pain is located overlying the metatarsal phalangeal joints.  She has limited range of motion of the first metatarsophalangeal joint most likely due to the elevation.  Radiographs demonstrate today continued nonunion.  No fractures identified.  Assessment nonunion.  Plan: Continue the use of the bone stimulator continue out of work.  I highly recommend that she continue or start back with physical therapy.  I will follow-up with her in 1 month

## 2020-05-17 DIAGNOSIS — R42 Dizziness and giddiness: Secondary | ICD-10-CM | POA: Insufficient documentation

## 2020-05-17 DIAGNOSIS — I472 Ventricular tachycardia: Secondary | ICD-10-CM | POA: Insufficient documentation

## 2020-05-17 DIAGNOSIS — I4729 Other ventricular tachycardia: Secondary | ICD-10-CM | POA: Insufficient documentation

## 2020-05-17 DIAGNOSIS — G909 Disorder of the autonomic nervous system, unspecified: Secondary | ICD-10-CM | POA: Insufficient documentation

## 2020-05-23 ENCOUNTER — Encounter: Payer: Self-pay | Admitting: Podiatry

## 2020-05-23 MED ORDER — CYCLOBENZAPRINE HCL 10 MG PO TABS: 10.0000 mg | ORAL_TABLET | Freq: Three times a day (TID) | ORAL | 1 refills | Status: DC | PRN

## 2020-05-24 ENCOUNTER — Encounter: Payer: Self-pay | Admitting: Adult Health

## 2020-05-24 ENCOUNTER — Ambulatory Visit (INDEPENDENT_AMBULATORY_CARE_PROVIDER_SITE_OTHER): Payer: BC Managed Care – PPO | Admitting: Adult Health

## 2020-05-24 ENCOUNTER — Other Ambulatory Visit: Payer: Self-pay

## 2020-05-24 VITALS — BP 99/57 | HR 81 | Ht 63.0 in | Wt 151.8 lb

## 2020-05-24 DIAGNOSIS — R399 Unspecified symptoms and signs involving the genitourinary system: Secondary | ICD-10-CM | POA: Diagnosis not present

## 2020-05-24 DIAGNOSIS — N6313 Unspecified lump in the right breast, lower outer quadrant: Secondary | ICD-10-CM | POA: Diagnosis not present

## 2020-05-24 LAB — POCT URINALYSIS DIPSTICK OB
Glucose, UA: NEGATIVE
Ketones, UA: NEGATIVE
Nitrite, UA: NEGATIVE
POC,PROTEIN,UA: NEGATIVE

## 2020-05-24 NOTE — Progress Notes (Signed)
  Subjective:     Patient ID: Candice Estrada, female   DOB: 04/12/1975, 45 y.o.   MRN: [id number]  HPI Candice Estrada is a 45 year old white female, married, [id number] in complaining of right breast lump, and it is tender.   Review of Systems Tender right breast lump Spotting some  Reviewed past medical,surgical, social and family history. Reviewed medications and allergies.     Objective:   Physical Exam BP (!) 99/57 (BP Location: Right Arm, Patient Position: Sitting, Cuff Size: Normal)   Pulse 81   Ht 5' 3"  (1.6 m)   Wt 151 lb 12.8 oz (68.9 kg)   LMP 05/11/2020 (Exact Date)   BMI 26.89 kg/m urine dipstick trace leuks, and moderate blood.  Skin warm and dry,  Breasts:no dominate palpable mass, retraction or nipple discharge on the left, on the right, no retraction or nipple discharge, but has mass at 7-8 0'clock that is tender.  Upstream - 05/24/20 1057      Pregnancy Intention Screening   Does the patient want to become pregnant in the next year? No    Does the patient's partner want to become pregnant in the next year? No    Would the patient like to discuss contraceptive options today? No      Contraception Wrap Up   Current Method IUD or IUS    End Method IUD or IUS    Contraception Counseling Provided No             Assessment:     1. Symptoms of urinary tract infection  2. Mass of lower outer quadrant of right breast Right diagnostic mammogram and [place] scheduled at [facility] for 06/13/20 at 3:40 pm    Plan:     Follow up prn

## 2020-05-30 ENCOUNTER — Encounter: Payer: Self-pay | Admitting: Internal Medicine

## 2020-05-30 ENCOUNTER — Ambulatory Visit (INDEPENDENT_AMBULATORY_CARE_PROVIDER_SITE_OTHER): Payer: BC Managed Care – PPO | Admitting: Internal Medicine

## 2020-05-30 ENCOUNTER — Other Ambulatory Visit: Payer: Self-pay

## 2020-05-30 DIAGNOSIS — G909 Disorder of the autonomic nervous system, unspecified: Secondary | ICD-10-CM

## 2020-05-30 DIAGNOSIS — I472 Ventricular tachycardia: Secondary | ICD-10-CM | POA: Diagnosis not present

## 2020-05-30 DIAGNOSIS — I4729 Other ventricular tachycardia: Secondary | ICD-10-CM

## 2020-05-30 DIAGNOSIS — R42 Dizziness and giddiness: Secondary | ICD-10-CM | POA: Diagnosis not present

## 2020-05-30 NOTE — Patient Instructions (Signed)
Medication Instructions:  Your physician recommends that you continue on your current medications as directed. Please refer to the Current Medication list given to you today.  *If you need a refill on your cardiac medications before your next appointment, please call your pharmacy*   Lab Work: None ordered.  If you have labs (blood work) drawn today and your tests are completely normal, you will receive your results only by: Marland Kitchen MyChart Message (if you have MyChart) OR . A paper copy in the mail If you have any lab test that is abnormal or we need to change your treatment, we will call you to review the results.   Testing/Procedures: None ordered.    Follow-Up: At [facility], you and your health needs are our priority.  As part of our continuing mission to provide you with exceptional heart care, we have created designated Provider Care Teams.  These Care Teams include your primary Cardiologist (physician) and Advanced Practice Providers (APPs -  Physician Assistants and Nurse Practitioners) who all work together to provide you with the care you need, when you need it.  We recommend signing up for the patient portal called "MyChart".  Sign up information is provided on this After Visit Summary.  MyChart is used to connect with patients for Virtual Visits (Telemedicine).  Patients are able to view lab/test results, encounter notes, upcoming appointments, etc.  Non-urgent messages can be sent to your provider as well.   To learn more about what you can do with MyChart, go to [email or web address].    Your next appointment:   12 month(s)  The format for your next appointment:   In Person  Provider:   Virl Axe, MD

## 2020-05-30 NOTE — Progress Notes (Signed)
Patient Care Team: Pcp, No as PCP - General Sherren Mocha, MD as PCP - Cardiology (Cardiology) Deboraha Sprang, MD as PCP - Electrophysiology (Cardiology)   HPI  Candice Estrada is a 45 y.o. female seen in followup for syncope in the context of a prior ASD closure  She continues to complain of palpitations which are becoming increasingly frequent. They're quite frightening at times. They're relatively brief lasting only seconds and hence has not been amenable to the AliveCor monitor. Review of her old chart demonstrates nonsustained ventricular tachycardia on her ZIO patch. In her mind these are similar.  These are increasingly frequent, and most recently a colleague noted pallor, and HR 120s with BP 80s  Echocardiogram was normal. signal average ECG was normal  She also has heat intolerance shower intolerance and worsening symptoms at the time of her period. However, she is not a candidate for hormonal therapy because of her prior stroke.  She has had 2 distinct dizzy syndromes.  One is associated with a generalized sense of motion of the environment.  The other is more vertiginous.  She has seen Dr. 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  Interestingly, she has had no further dizziness since her foot surgery.  However, foot surgery has been complicated  Since her last visit she has had a surgical procedure for her avascular necrosis. Aside from her left foot due to her surgery, she denies any peripheral edema. Also, her palpitations are now very rare. She reports her at home blood pressure has been lower than baseline.  But no significant dizziness with changes in position and no tachy palpitations  It is still difficult for her to be physically active. She can swim but is unable to run. She walks best on ground and dirt, but otherwise has difficulty.  The patient denies chest pain, shortness of breath, nocturnal  dyspnea, or orthopnea.  There has been no syncopal episodes or lightheadedness.    fQRS HFLA <40 RMS40  96 26 56     DATE TEST EF   1/20 ECHO  60-65%   3/20 cMRI 60-65% No scar  6/20 ECHO  (left?)60-65 %   3/21 CaScore  = 0   3/22 Echo 60-65% Neg Bubble study   DATE K Cr Hbg  2/22 4.4  0.99  14.8         Event recorder 9/20 only 8 hours duration demonstrated no ventricular ectopy and occasional PAC and a triggered event that was sinus rhythm  Past Medical History:  Diagnosis Date  . Anxiety   . ASD (atrial septal defect), ostium secundum    Status post closure 08/20/10 with Dr. Burt Knack; procedure complicated by right groin hematoma and acute blood loss anemia  . Atypical squamous cell changes of undetermined significance (ASCUS) on cervical cytology with negative high risk human papilloma virus (HPV) test result 08/04/2019   07/2019 repeat pap in 3 years per ASCCP guidelines 5 year CIN 3+ risk 0.27%  . BV (bacterial vaginosis) 10/06/2014  . Complication of anesthesia    VASO-VAGAL RESPONSE IN RECOVERY ROOM AFTER HEART SURGERY  . Hematuria   . History of psoriasis    scalp  . History of TIAs   . Hx of Coronary Ca Score 03/2019   CT 03/2019: Cor Calcium Score 0  . Hx of dizziness    random episodes "because my BP is so low"  . Hypotension   . IUD (  intrauterine device) in place 12/16/2012  . Kidney stone   . Meniere's disease   . Night terrors   . NSVT (nonsustained ventricular tachycardia) (HCC)    a. 4 beat run on Zio 2015.  Marland Kitchen Ocular neoplasm   . Ovarian cyst   . POTS (postural orthostatic tachycardia syndrome)   . SBO (small bowel obstruction) ([place])   . 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 Thyroid enlarged 12/16/2012   History of cyst will get [place]    Past Surgical History:  Procedure Laterality Date  . ANKLE ARTHROSCOPY WITH RECONSTRUCTION Left 02/19/2018   Procedure: LEFT ANKLE ARTHROSCOPIC DEBRIDEMENT, LATERAL LIGAMENT RECONSTRUCTION,  OSTEOCHONDRAL  TREATMENT;  Surgeon: Erle Crocker, MD;  Location: [facility];  Service: Orthopedics;  Laterality: Left;  . ASD REPAIR  08/20/2010  . CARDIAC SURGERY    . CYSTOSCOPY WITH RETROGRADE PYELOGRAM, URETEROSCOPY AND STENT PLACEMENT Left 08/31/2014   Procedure: CYSTOSCOPY WITH BILATERAL RETROGRADE PYELOGRAM, URETEROSCOPY AND STENT PLACEMENT, STONE EXTRACTION WITH BASKET;  Surgeon: Cleon Gustin, MD;  Location: [facility];  Service: Urology;  Laterality: Left;  . FOOT NEUROMA SURGERY  2009   left  . kidney stone removed    . THYROID CYST EXCISION    . TONSILLECTOMY    . vascular necrosis left foot      Current Outpatient Medications  Medication Sig Dispense Refill  . buPROPion (WELLBUTRIN SR) 150 MG 12 hr tablet TAKE 1 TABLET DAILY 90 tablet 3  . cyclobenzaprine (FLEXERIL) 10 MG tablet Take 1 tablet (10 mg total) by mouth 3 (three) times daily as needed for muscle spasms. 30 tablet 1  . ibuprofen (ADVIL) 800 MG tablet Take 1 tablet (800 mg total) by mouth every 8 (eight) hours as needed. 90 tablet 4  . meclizine (ANTIVERT) 25 MG tablet 1 tablet by mouth twice a day as needed for vertigo 90 tablet 3  . Melatonin 3 MG TABS Take 1 tablet by mouth daily.    . Multiple Vitamin (MULTIVITAMIN WITH MINERALS) TABS Take 1 tablet by mouth every morning.     . pantoprazole (PROTONIX) 40 MG tablet Take 40 mg by mouth daily.    Marland Kitchen PARAGARD INTRAUTERINE COPPER IU by Intrauterine route.    . Probiotic Product (PROBIOTIC PO) Take 1 tablet by mouth daily.     No current facility-administered medications for this visit.    No Known Allergies  Review of Systems negative except from HPI and PMH  Physical Exam BP 116/85 (BP Location: Right Arm, Patient Position: Standing)   Pulse 73   Ht 5' 3"  (1.6 m)   Wt 147 lb 9.6 oz (67 kg)   LMP 05/11/2020 (Exact Date)   SpO2 97%   BMI 26.15 kg/m   Well developed and nourished in no acute distress HENT normal Neck supple with JVP-   flat Clear Regular rate and rhythm, no murmurs or gallops Abd-soft with active BS No Clubbing cyanosis edema Skin-warm and dry A & Oriented  Grossly normal sensory and motor function  ECG sinus at 73 Intervals 14/08/40  Assessment and  Plan  VT NS  Autonomic insufficiency  Lightheadedness   Ocular neoplasm "surface squamous neoplasia"  ASD repair AS Amplatzer  POTS ? Vertigo    Dizziness resolved and without orthostatic evidence for POTS   Skipped palpitations  Blood pressure is a bit low; we discussed the role of salt for retention of fluid and the augmentation of blood pressure; however, thought requires glucoses,  transport and with her concerns regarding weight have suggested she think about taking straight salt tablets with copious fluids.  I,Alexis Bryant,acting as a scribe for Virl Axe, MD.,have documented all relevant documentation on the behalf of Virl Axe, MD,as directed by  Virl Axe, MD while in the presence of Virl Axe, MD.  I, Virl Axe, MD, have reviewed all documentation for this visit. The documentation on 05/30/20 for the exam, diagnosis, procedures, and orders are all accurate and complete.

## 2020-06-02 ENCOUNTER — Ambulatory Visit: Payer: BC Managed Care – PPO | Admitting: Adult Health

## 2020-06-13 ENCOUNTER — Inpatient Hospital Stay (HOSPITAL_COMMUNITY): Admission: RE | Admit: 2020-06-13 | Payer: BC Managed Care – PPO | Source: Ambulatory Visit

## 2020-06-13 ENCOUNTER — Ambulatory Visit (HOSPITAL_COMMUNITY): Payer: BC Managed Care – PPO

## 2020-06-13 DIAGNOSIS — M542 Cervicalgia: Secondary | ICD-10-CM | POA: Diagnosis not present

## 2020-06-14 ENCOUNTER — Encounter: Payer: Self-pay | Admitting: Podiatry

## 2020-06-14 ENCOUNTER — Ambulatory Visit (INDEPENDENT_AMBULATORY_CARE_PROVIDER_SITE_OTHER): Admitting: Podiatry

## 2020-06-14 ENCOUNTER — Ambulatory Visit (INDEPENDENT_AMBULATORY_CARE_PROVIDER_SITE_OTHER): Payer: BC Managed Care – PPO

## 2020-06-14 ENCOUNTER — Other Ambulatory Visit: Payer: Self-pay

## 2020-06-14 DIAGNOSIS — M2012 Hallux valgus (acquired), left foot: Secondary | ICD-10-CM | POA: Diagnosis not present

## 2020-06-14 DIAGNOSIS — M85872 Other specified disorders of bone density and structure, left ankle and foot: Secondary | ICD-10-CM | POA: Diagnosis not present

## 2020-06-14 DIAGNOSIS — M2011 Hallux valgus (acquired), right foot: Secondary | ICD-10-CM

## 2020-06-14 DIAGNOSIS — M79671 Pain in right foot: Secondary | ICD-10-CM | POA: Diagnosis not present

## 2020-06-14 DIAGNOSIS — Z9889 Other specified postprocedural states: Secondary | ICD-10-CM

## 2020-06-14 NOTE — Progress Notes (Signed)
She presents today for follow-up of her nonunion of the first metatarsal medial cuneiform base.  States that she still having a lot of swelling a lot of trouble walking date of surgery October 22, 2019 and she is concerned about pain to the third toe as well.  Objective: Vital signs are stable alert oriented x3.  Pulses are palpable.  Still has severe pain on palpation of the second metatarsophalangeal joint she has some limited range of motion of the hallux interphalangeal joint no pain on palpation of third toe radiographically appears that she is healing the nonunion.  Assessment: Slowly healing nonunion chronic pain cannot rule out CRPS  Plan: Continue current therapies follow-up with me again in a month to 6 weeks I am going to get her set of orthotics built.  Hopefully this will help keep the pressure off the first second metatarsal phalangeal joint

## 2020-06-19 ENCOUNTER — Telehealth: Payer: Self-pay | Admitting: Internal Medicine

## 2020-06-19 NOTE — Telephone Encounter (Signed)
HIM received Physician statement form from [employer]. An invoice has been faxed to Candice Estrada for $29 to complete the form. Waiting for payment. AO 06/19/20

## 2020-06-20 ENCOUNTER — Encounter: Payer: BC Managed Care – PPO | Admitting: Podiatry

## 2020-06-20 NOTE — Telephone Encounter (Signed)
Duplicate Form also received in [place] office.  lmov to notify patient .

## 2020-06-22 ENCOUNTER — Ambulatory Visit (INDEPENDENT_AMBULATORY_CARE_PROVIDER_SITE_OTHER): Admitting: Podiatry

## 2020-06-22 ENCOUNTER — Other Ambulatory Visit: Payer: Self-pay

## 2020-06-22 ENCOUNTER — Ambulatory Visit (INDEPENDENT_AMBULATORY_CARE_PROVIDER_SITE_OTHER): Payer: BC Managed Care – PPO

## 2020-06-22 DIAGNOSIS — M2012 Hallux valgus (acquired), left foot: Secondary | ICD-10-CM

## 2020-06-22 NOTE — Progress Notes (Unsigned)
Lt

## 2020-06-22 NOTE — Progress Notes (Signed)
Patient in office today with complaint of swelling in the left foot causing increase in pain level. Patient reported a 8/10 on the pain scale after her physical therapy session today. Patient has requested an x-ray today that was completed and forwarded to the provider to review.

## 2020-06-25 NOTE — Progress Notes (Signed)
Patient was not seen by me today.  She presented to have radiographs taken for evaluation of her left foot.  She states that it has been swelling recently and has become more painful was wonderingif she had developed a stress fracture.   Radiographs taken today demonstrate partial met head resection for arthroplasty of the second left.  This appears to be doing just fine there is a little bit of bony growth.  There she also had a Lapidus procedure which she had developed into a nonunion but appears to be healing in very nicely with the use of a bone stimulator there appears to be ossification occurring at this point.  No signs of fracture of internal fixation.  It is in good position and type.  Lateral aspect of the left foot does not demonstrate anything other than some soft tissue edema at this point we do not see any stress fracture or any periostitis that would indicate a stress fracture.  At this point significant healing of the first TMT joint and some edema to the lateral aspect of the foot but no new trauma. 

## 2020-06-26 ENCOUNTER — Telehealth: Payer: Self-pay | Admitting: Podiatry

## 2020-06-26 NOTE — Telephone Encounter (Signed)
Patient left a voicemail 6/9 around 1:21pm, about a 2nd opinion. She needs Dr. Milinda Pointer to say that he doesn't agree with the restrictions that her other Physician gave her. She asked that someone call her back so she can discuss.

## 2020-06-27 NOTE — Telephone Encounter (Signed)
Patient returning call and states she already discussed with dr. Caryl Estrada and fax / forms can be disregarded .

## 2020-06-27 NOTE — Telephone Encounter (Signed)
Received another request from [employer] employee [employer] for Attending physician statement .  Lmov for patient to contact office.

## 2020-06-30 ENCOUNTER — Telehealth: Payer: Self-pay | Admitting: *Deleted

## 2020-06-30 NOTE — Telephone Encounter (Signed)
Returned call and left vmessage to call back concerning a 2nd opinion from the physician.

## 2020-07-03 NOTE — Telephone Encounter (Signed)
Called patient to ask for more information on a 2nd opinion, no answer, left vmessage asking for additional information.

## 2020-07-04 ENCOUNTER — Other Ambulatory Visit: Payer: Self-pay

## 2020-07-04 ENCOUNTER — Ambulatory Visit (HOSPITAL_COMMUNITY)
Admission: RE | Admit: 2020-07-04 | Discharge: 2020-07-04 | Disposition: A | Discharge status: Home / Self Care | Payer: BC Managed Care – PPO | Source: Ambulatory Visit | Attending: Adult Health | Admitting: Adult Health

## 2020-07-04 ENCOUNTER — Telehealth: Payer: Self-pay | Admitting: *Deleted

## 2020-07-04 DIAGNOSIS — R922 Inconclusive mammogram: Secondary | ICD-10-CM | POA: Diagnosis not present

## 2020-07-04 DIAGNOSIS — N6313 Unspecified lump in the right breast, lower outer quadrant: Secondary | ICD-10-CM | POA: Insufficient documentation

## 2020-07-04 NOTE — Telephone Encounter (Signed)
Patient is calling back and she wanted to let the doctor know that workman's comp.sent her to get a second opinion w/ their doctors in March.  He changed her restrictions,making her go back to work or quit very soon. She wants to know if Dr Milinda Pointer could reverse their decision and disagree. Please advise.  She said that they were supposed to send notes but she will drop off a copy tomorrow when picking up orthotics

## 2020-07-05 ENCOUNTER — Ambulatory Visit (INDEPENDENT_AMBULATORY_CARE_PROVIDER_SITE_OTHER): Admitting: Podiatry

## 2020-07-05 DIAGNOSIS — Z9889 Other specified postprocedural states: Secondary | ICD-10-CM

## 2020-07-05 DIAGNOSIS — M2012 Hallux valgus (acquired), left foot: Secondary | ICD-10-CM

## 2020-07-05 DIAGNOSIS — M85872 Other specified disorders of bone density and structure, left ankle and foot: Secondary | ICD-10-CM

## 2020-07-05 NOTE — Progress Notes (Signed)
Patient presents today for orthotic pick up. Patient voices no new complaints.  Orthotics were fitted to patient's feet. No discomfort and no rubbing. Patient satisfied with the orthotics.  Orthotics were dispensed to patient with instructions for break in wear and to call the office with any concerns or questions. 

## 2020-07-05 NOTE — Patient Instructions (Signed)

## 2020-07-18 ENCOUNTER — Encounter (HOSPITAL_COMMUNITY): Payer: BC Managed Care – PPO

## 2020-07-18 ENCOUNTER — Ambulatory Visit (HOSPITAL_COMMUNITY): Payer: BC Managed Care – PPO

## 2020-07-31 ENCOUNTER — Other Ambulatory Visit: Payer: BC Managed Care – PPO

## 2020-08-02 ENCOUNTER — Other Ambulatory Visit (HOSPITAL_COMMUNITY): Payer: Self-pay | Admitting: Family Medicine

## 2020-08-02 DIAGNOSIS — Z1231 Encounter for screening mammogram for malignant neoplasm of breast: Secondary | ICD-10-CM

## 2020-08-08 ENCOUNTER — Other Ambulatory Visit: Payer: Self-pay

## 2020-08-08 ENCOUNTER — Ambulatory Visit (INDEPENDENT_AMBULATORY_CARE_PROVIDER_SITE_OTHER): Payer: BC Managed Care – PPO

## 2020-08-08 ENCOUNTER — Ambulatory Visit (INDEPENDENT_AMBULATORY_CARE_PROVIDER_SITE_OTHER): Payer: BC Managed Care – PPO | Admitting: Podiatry

## 2020-08-08 ENCOUNTER — Encounter: Payer: Self-pay | Admitting: Podiatry

## 2020-08-08 DIAGNOSIS — M7672 Peroneal tendinitis, left leg: Secondary | ICD-10-CM

## 2020-08-09 NOTE — Progress Notes (Signed)
She presents today states that she seems to be improving a little bit with physical therapy though the stamina of the foot is still not anything like she had prior to her initial surgeries.  She states that she continues physical therapy but has recently developed pain along the lateral aspect of the foot and feels that she may have stepped wrong.  She states that the pain is radiating along the lateral aspect of the foot as she points fifth metatarsal base distally.  She also goes on to state that she [place] because severe pain in the foot.  She states that the orthotics are not fitting correctly and she would like to follow-up with [facility] for that as soon as possible.  Objective: Vital signs are stable she is alert and oriented x3.  Pulses are palpable.  The edema in the foot is come down considerably but she still has limited range of motion of the first metatarsophalangeal joint.  Second toe has good range of motion.  She has no tenderness on palpation of the first metatarsal medial cuneiform joint.  She has tenderness on palpation of the lateral column of the foot along the sural nerve distribution mid diaphyseal fifth metatarsal area distally.  No pain on range of motion of the fifth met and no pain on range of motion of the fifth metatarsal phalangeal joint.  She still walks with an antalgic gait and the majority of her weight is lateralized.  Radiographs taken today do not demonstrate any fracture along the lateral side of the foot.  And bone healing appears to be near complete at the tarsometatarsal joint.  Otherwise no acute findings.  Internal fixation is intact.  Assessment slowly healing foot surgically speaking.  Contusion sural nerve left.  Chronic pain left foot.  Plan: Discussed etiology pathology conservative surgical therapies with her.  At this point I feel that the contusion of the nerve with physical therapy will go on to resolve.  We also discussed  contrast baths to help with the nerve issues.  I would like for her to continue physical therapy since we appear to be improving somewhat with that.  I feel that physical therapy is necessary and will more than likely be necessary on and off throughout her life.  She is always going to have a problem with this foot which may necessitate surgery in the future but hopefully orthotics will help alleviate some of this and increase some stamina.  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  She is going to be more suited for a sitdown job with limited walking most likely from now on.  I will follow-up with her in 3 to 4 months.

## 2020-08-31 ENCOUNTER — Ambulatory Visit (HOSPITAL_COMMUNITY)
Admission: RE | Admit: 2020-08-31 | Discharge: 2020-08-31 | Disposition: A | Discharge status: Home / Self Care | Payer: BC Managed Care – PPO | Source: Ambulatory Visit | Attending: Family Medicine | Admitting: Family Medicine

## 2020-08-31 ENCOUNTER — Other Ambulatory Visit: Payer: Self-pay

## 2020-08-31 DIAGNOSIS — Z1231 Encounter for screening mammogram for malignant neoplasm of breast: Secondary | ICD-10-CM | POA: Diagnosis not present

## 2020-09-05 ENCOUNTER — Encounter (HOSPITAL_COMMUNITY): Payer: Self-pay

## 2020-09-05 ENCOUNTER — Ambulatory Visit (HOSPITAL_COMMUNITY)
Admission: EM | Admit: 2020-09-05 | Discharge: 2020-09-05 | Disposition: A | Discharge status: Home / Self Care | Payer: BC Managed Care – PPO | Attending: Student | Admitting: Student

## 2020-09-05 ENCOUNTER — Other Ambulatory Visit: Payer: Self-pay

## 2020-09-05 DIAGNOSIS — R1033 Periumbilical pain: Secondary | ICD-10-CM | POA: Diagnosis not present

## 2020-09-05 DIAGNOSIS — Z8719 Personal history of other diseases of the digestive system: Secondary | ICD-10-CM | POA: Diagnosis not present

## 2020-09-05 DIAGNOSIS — R195 Other fecal abnormalities: Secondary | ICD-10-CM

## 2020-09-05 LAB — POCT URINALYSIS DIPSTICK, ED / UC
Bilirubin Urine: NEGATIVE
Glucose, UA: NEGATIVE mg/dL
Ketones, ur: 40 mg/dL — AB
Nitrite: NEGATIVE
Protein, ur: NEGATIVE mg/dL
Specific Gravity, Urine: >=1.03 (ref 1.005–1.030)
Urobilinogen, UA: 2 mg/dL — ABNORMAL HIGH (ref 0.0–1.0)
pH: 6.5 (ref 5.0–8.0)

## 2020-09-05 NOTE — Discharge Instructions (Addendum)
-  With your current abdominal pain and your history of bowel obstructions, it is possible that you have another bowel obstruction.  I cannot rule this out in the urgent care setting.  Please head to the emergency department for this work-up.  If you choose to wait overnight to go to the ER, your risk of severe complications does go up.  If your symptoms change like worsening pain, go straight to the emergency department or call 911.

## 2020-09-05 NOTE — ED Provider Notes (Signed)
[place]    CSN: [id number] Arrival date & time: 09/05/20  1831      History   Chief Complaint Chief Complaint  Patient presents with   Abdominal Pain    HPI Candice Estrada is a 45 y.o. female presenting with stomach issue.  Medical history of TIA, nephrolithiasis, NSVT, SBO, BV, IUD contraception, ovarian cyst.  Describes about 1 day of severe periumbilical pain with constipation and black stool.  States she had 1 bowel movement this morning, it was hard and part of it was black.  Denies red stool.  Denies nausea, vomiting.  Feeling well otherwise.  She did recently finish her period, denies urinary symptoms.  She is concerned that she has another bowel obstruction.  HPI  Past Medical History:  Diagnosis Date   Anxiety    ASD (atrial septal defect), ostium secundum    Status post closure 08/20/10 with Dr. Burt Knack; procedure complicated by right groin hematoma and acute blood loss anemia   Atypical squamous cell changes of undetermined significance (ASCUS) on cervical cytology with negative high risk human papilloma virus (HPV) test result 08/04/2019   07/2019 repeat pap in 3 years per ASCCP guidelines 5 year CIN 3+ risk 0.27%   BV (bacterial vaginosis) 08/08/3662   Complication of anesthesia    VASO-VAGAL RESPONSE IN RECOVERY ROOM AFTER HEART SURGERY   Hematuria    History of psoriasis    scalp   History of TIAs    Hx of Coronary Ca Score 03/2019   CT 03/2019: Cor Calcium Score 0   Hx of dizziness    random episodes "because my BP is so low"   Hypotension    IUD (intrauterine device) in place 12/16/2012   Kidney stone    Meniere's disease    Night terrors    NSVT (nonsustained ventricular tachycardia) ([place])    a. 4 beat run on Zio 2015.   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    Thyroid enlarged 12/16/2012   History of  cyst will get [place]    Patient Active Problem List   Diagnosis Date Noted   Mass of lower outer quadrant of right breast 05/24/2020   Symptoms of urinary tract infection 05/24/2020   NSVT (nonsustained ventricular tachycardia) ([place]) 05/17/2020   Autonomic dysfunction - insufficency 05/17/2020   Lightheadedness 05/17/2020   Sebaceous cyst of labia 05/03/2020   Encounter for IUD insertion 03/30/2020   Atypical squamous cell changes of undetermined significance (ASCUS) on cervical cytology with negative high risk human papilloma virus (HPV) test result 08/04/2019   Encounter for screening fecal occult blood testing 07/29/2019   Encounter for gynecological examination with Papanicolaou smear of cervix 07/29/2019   Hot flashes 08/10/2018   Moody 08/10/2018   Weight gain 08/10/2018   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/24/2018   Small bowel obstruction ([place]) 07/08/2017   Partial small bowel obstruction ([place]) 07/08/2017   Enteritis of infectious origin 07/08/2017   Hypokalemia 07/08/2017   Enteritis 07/08/2017   Dehydration    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   History of thyroid nodule 10/21/2016   Vaginal discharge 10/06/2014   BV (bacterial vaginosis) 10/06/2014   Ovarian cyst 01/26/2014   Pelvic pain in female 01/19/2014   Anxiety 01/19/2014   Fecal occult blood test positive 01/19/2014  Feces contents abnormal 01/19/2014   Symptom associated with female genital organs 01/19/2014   Night terrors, adult 02/23/2013   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   IUD (intrauterine device) in place 12/16/2012   Thyroid enlarged 12/16/2012   Well woman exam with routine gynecological exam 09/09/2012   Irregular bleeding 08/03/2012   Syncope and collapse 04/26/2011   Fatigue 02/06/2011   POTS (postural orthostatic tachycardia syndrome) 09/15/2010   TIA (transient ischemic attack) 09/15/2010   Tachycardia 09/15/2010   Dizziness 09/10/2010    Hypotension 09/10/2010   Disequilibrium 09/10/2010   Palpitations 09/06/2010   Atrial septal defect, secundum 08/10/2010   Atrial septal defect 08/10/2010   OVARIAN CYST, RIGHT 12/28/2007   Anxiety state 12/03/2005    Past Surgical History:  Procedure Laterality Date   ANKLE ARTHROSCOPY WITH RECONSTRUCTION Left 02/19/2018   Procedure: LEFT ANKLE ARTHROSCOPIC DEBRIDEMENT, LATERAL LIGAMENT RECONSTRUCTION,  OSTEOCHONDRAL TREATMENT;  Surgeon: Erle Crocker, MD;  Location: [facility];  Service: Orthopedics;  Laterality: Left;   ASD REPAIR  08/20/2010   CARDIAC SURGERY     CYSTOSCOPY WITH RETROGRADE PYELOGRAM, URETEROSCOPY AND STENT PLACEMENT Left 08/31/2014   Procedure: CYSTOSCOPY WITH BILATERAL RETROGRADE PYELOGRAM, URETEROSCOPY AND STENT PLACEMENT, STONE EXTRACTION WITH BASKET;  Surgeon: Cleon Gustin, MD;  Location: [facility];  Service: Urology;  Laterality: Left;   FOOT NEUROMA SURGERY  2009   left   kidney stone removed     THYROID CYST EXCISION     TONSILLECTOMY     vascular necrosis left foot      OB History     Gravida  3   Para  2   Term      Preterm      AB  1   Living  2      SAB  1   IAB      Ectopic      Multiple      Live Births  2            Home Medications    Prior to Admission medications   Medication Sig Start Date End Date Taking? Authorizing Provider  buPROPion [facility] SR) 150 MG 12 hr tablet TAKE 1 TABLET DAILY 05/11/20   Derrek Monaco A, NP  cyclobenzaprine (FLEXERIL) 10 MG tablet Take 1 tablet (10 mg total) by mouth 3 (three) times daily as needed for muscle spasms. 05/23/20   Hyatt, Max T, DPM  ibuprofen (ADVIL) 800 MG tablet Take 1 tablet (800 mg total) by mouth every 8 (eight) hours as needed. 05/15/20   Estill Dooms, NP  meclizine (ANTIVERT) 25 MG tablet 1 tablet by mouth twice a day as needed for vertigo 10/23/18   Deboraha Sprang, MD  Melatonin 3 MG TABS Take 1 tablet by mouth daily.    [provider]  Multiple Vitamin (MULTIVITAMIN WITH MINERALS) TABS Take 1 tablet by mouth every morning.     [provider]  pantoprazole (PROTONIX) 40 MG tablet Take 40 mg by mouth daily. 02/08/19   [provider]  PARAGARD INTRAUTERINE COPPER IU by Intrauterine route.    [provider]  Probiotic Product (PROBIOTIC PO) Take 1 tablet by mouth daily.    [provider]    Family History Family History  Problem Relation Age of Onset   Hypertension Father 43   Stroke Father    Heart attack Father 35   Brain cancer Father    Heart disease Father    Diabetes  Father    Liver disease Mother 17       dead   Cancer Mother        breast   Hypertension Brother 60   Cancer Paternal Grandmother        breast   Dementia Maternal Grandmother    Cancer Maternal Grandmother        thyroid   Cancer Paternal Aunt        breast    Kidney failure Daughter    Cancer Other        mom's grandma-breast    Social History Social History   Tobacco Use   Smoking status: Never   Smokeless tobacco: Never  Vaping Use   Vaping Use: Never used  Substance Use Topics   Alcohol use: Yes    Alcohol/week: 0.0 standard drinks    Comment: very rarely   Drug use: No     Allergies   Patient has no known allergies.   Review of Systems Review of Systems  Constitutional:  Negative for appetite change, chills, diaphoresis, fever and unexpected weight change.  HENT:  Negative for congestion, ear pain, sinus pressure, sinus pain, sneezing, sore throat and trouble swallowing.   Respiratory:  Negative for cough, chest tightness and shortness of breath.   Cardiovascular:  Negative for chest pain.  Gastrointestinal:  Positive for abdominal pain, blood in stool and constipation. Negative for abdominal distention, anal bleeding, diarrhea, nausea, rectal pain and vomiting.  Genitourinary:  Negative for dysuria, flank pain, frequency and urgency.  Musculoskeletal:  Negative  for back pain and myalgias.  Neurological:  Negative for dizziness, light-headedness and headaches.  All other systems reviewed and are negative.   Physical Exam Triage Vital Signs ED Triage Vitals  Enc Vitals Group     BP      Pulse      Resp      Temp      Temp src      SpO2      Weight      Height      Head Circumference      Peak Flow      Pain Score      Pain Loc      Pain Edu?      Excl. in [place]?    No data found.  Updated Vital Signs BP 122/79 (BP Location: Left Arm)   Pulse 80   Temp 98.2 F (36.8 C) (Oral)   Resp 20   LMP 09/01/2020 (Exact Date)   SpO2 96%   Visual Acuity Right Eye Distance:   Left Eye Distance:   Bilateral Distance:    Right Eye Near:   Left Eye Near:    Bilateral Near:     Physical Exam Vitals reviewed.  Constitutional:      General: She is not in acute distress.    Appearance: Normal appearance. She is not ill-appearing.  HENT:     Head: Normocephalic and atraumatic.     Mouth/Throat:     Mouth: Mucous membranes are moist.     Comments: Moist mucous membranes Eyes:     Extraocular Movements: Extraocular movements intact.     Pupils: Pupils are equal, round, and reactive to light.  Cardiovascular:     Rate and Rhythm: Normal rate and regular rhythm.     Heart sounds: Normal heart sounds.  Pulmonary:     Effort: Pulmonary effort is normal.     Breath sounds: Normal breath sounds. No wheezing,  rhonchi or rales.  Abdominal:     General: Bowel sounds are normal. There is distension.     Palpations: There is no mass.     Tenderness: There is abdominal tenderness in the periumbilical area. There is no right CVA tenderness, left CVA tenderness, guarding or rebound.     Comments: Significant periumbilical tenderness without mass or rebound. Some guarding. BS positive throughout.  Skin:    General: Skin is warm.     Capillary Refill: Capillary refill takes less than 2 seconds.     Comments: Good skin turgor  Neurological:      General: No focal deficit present.     Mental Status: She is alert and oriented to person, place, and time.  Psychiatric:        Mood and Affect: Mood normal.        Behavior: Behavior normal.     UC Treatments / Results  Labs (all labs ordered are listed, but only abnormal results are displayed) Labs Reviewed  POCT URINALYSIS DIPSTICK, ED / UC - Abnormal; Notable for the following components:      Result Value   Ketones, ur 40 (*)    Hgb urine dipstick LARGE (*)    Urobilinogen, UA 2.0 (*)    Leukocytes,Ua TRACE (*)    All other components within normal limits    EKG   Radiology No results found.  Procedures Procedures (including critical care time)  Medications Ordered in UC Medications - No data to display  Initial Impression / Assessment and Plan / UC Course  I have reviewed the triage vital signs and the nursing notes.  Pertinent labs & imaging results that were available during my care of the patient were reviewed by me and considered in my medical decision making (see chart for details).     This patient is a very pleasant 45 y.o. year old female presenting with possible bowel obstruction. History of multiple SBOs in the past. I am concerned for another bowel obstruction based on presentation- significant abd pain with constipation. Recommended ED evaluation. Patient is in agreement.   Final Clinical Impressions(s) / UC Diagnoses   Final diagnoses:  Periumbilical abdominal pain  History of small bowel obstruction  Dark stools     Discharge Instructions      -With your current abdominal pain and your history of bowel obstructions, it is possible that you have another bowel obstruction.  I cannot rule this out in the urgent care setting.  Please head to the emergency department for this work-up.  If you choose to wait overnight to go to the ER, your risk of severe complications does go up.  If your symptoms change like worsening pain, go straight to the  emergency department or call 911.     ED Prescriptions   None    PDMP not reviewed this encounter.   Hazel Sams, PA-C 09/05/20 2004

## 2020-09-05 NOTE — ED Triage Notes (Signed)
Pt presents with abdominal pain X 1 day. States she feels soreness around her umbilicus. Pt states she has a hx of GI blockages and states she has had only one movement today. States it is normal for her to not have regular bowel movements. States she has been taking Ibuprofen and c/o pain when lying on stomach. Pt states she recently had a cycle and had more bleeding than usual. Denies nausea, emesis and back pain.

## 2020-09-07 ENCOUNTER — Ambulatory Visit: Payer: BC Managed Care – PPO | Admitting: Nurse Practitioner

## 2020-09-07 ENCOUNTER — Telehealth (INDEPENDENT_AMBULATORY_CARE_PROVIDER_SITE_OTHER): Payer: BC Managed Care – PPO | Admitting: Nurse Practitioner

## 2020-09-07 ENCOUNTER — Other Ambulatory Visit: Payer: Self-pay

## 2020-09-07 DIAGNOSIS — Z91199 Patient's noncompliance with other medical treatment and regimen due to unspecified reason: Secondary | ICD-10-CM

## 2020-09-07 DIAGNOSIS — Z5329 Procedure and treatment not carried out because of patient's decision for other reasons: Secondary | ICD-10-CM

## 2020-09-07 NOTE — Progress Notes (Signed)
Text sent to start video visit at ~3:20 pm Call placed at ~3:25 pm, voice mail box full and unable to leave message. Call placed at 3:30 pm, voice mail box full and unable to leave message.   Visit will need to be rescheduled.

## 2020-09-07 NOTE — Progress Notes (Deleted)
Subjective:    Patient ID: Candice Estrada, female    DOB: February 23, 1975, 45 y.o.   MRN: [id number]  HPI: Candice Estrada is a 45 y.o. female presenting for  No chief complaint on file.  ABDOMINAL PAIN  Duration: Onset: {Blank single:19197::"sudden","gradual"} Severity: Quality: Location:  {Blank multiple:19196::"LUQ","RUQ","epigastric","peri-umbilical","LLQ","RLQ","diffuse","suprapubic". "lower abdominal quadrants"}  Episode duration:  Radiation: {Blank single:19197::"yes","no"} Frequency: Alleviating factors:  Aggravating factors: Status: Treatments attempted: Fever: {Blank single:19197::"yes","no"} Nausea: {Blank single:19197::"yes","no"} Vomiting: {Blank single:19197::"yes","no"} Weight loss: {Blank single:19197::"yes","no"} Decreased appetite: {Blank single:19197::"yes","no"} Diarrhea: {Blank single:19197::"yes","no"} Constipation: {Blank single:19197::"yes","no"} Blood in stool: {Blank single:19197::"yes","no"} Heartburn: {Blank single:19197::"yes","no"} Jaundice: {Blank single:19197::"yes","no"} Rash: {Blank single:19197::"yes","no"} Dysuria/urinary frequency: {Blank single:19197::"yes","no"} Hematuria: {Blank single:19197::"yes","no"} History of sexually transmitted disease: {Blank single:19197::"yes","no"} Recurrent NSAID use: {Blank single:19197::"yes","no"}    No Known Allergies  Outpatient Encounter Medications as of 09/07/2020  Medication Sig   buPROPion (WELLBUTRIN SR) 150 MG 12 hr tablet TAKE 1 TABLET DAILY   cyclobenzaprine (FLEXERIL) 10 MG tablet Take 1 tablet (10 mg total) by mouth 3 (three) times daily as needed for muscle spasms.   ibuprofen (ADVIL) 800 MG tablet Take 1 tablet (800 mg total) by mouth every 8 (eight) hours as needed.   meclizine (ANTIVERT) 25 MG tablet 1 tablet by mouth twice a day as needed for vertigo   Melatonin 3 MG TABS Take 1 tablet by mouth daily.   Multiple Vitamin (MULTIVITAMIN WITH MINERALS) TABS Take 1 tablet by mouth  every morning.    pantoprazole (PROTONIX) 40 MG tablet Take 40 mg by mouth daily.   PARAGARD INTRAUTERINE COPPER IU by Intrauterine route.   Probiotic Product (PROBIOTIC PO) Take 1 tablet by mouth daily.   No facility-administered encounter medications on file as of 09/07/2020.    Patient Active Problem List   Diagnosis Date Noted   Mass of lower outer quadrant of right breast 05/24/2020   Symptoms of urinary tract infection 05/24/2020   NSVT (nonsustained ventricular tachycardia) ([place]) 05/17/2020   Autonomic dysfunction - insufficency 05/17/2020   Lightheadedness 05/17/2020   Sebaceous cyst of labia 05/03/2020   Encounter for IUD insertion 03/30/2020   Atypical squamous cell changes of undetermined significance (ASCUS) on cervical cytology with negative high risk human papilloma virus (HPV) test result 08/04/2019   Encounter for screening fecal occult blood testing 07/29/2019   Encounter for gynecological examination with Papanicolaou smear of cervix 07/29/2019   Hot flashes 08/10/2018   Moody 08/10/2018   Weight gain 08/10/2018   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[place]) 07/24/2018   Small bowel obstruction ([place]) 07/08/2017   Partial small bowel obstruction ([place]) 07/08/2017   Enteritis of infectious origin 07/08/2017   Hypokalemia 07/08/2017   Enteritis 07/08/2017   Dehydration    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   History of thyroid nodule 10/21/2016   Vaginal discharge 10/06/2014   BV (bacterial vaginosis) 10/06/2014   Ovarian cyst 01/26/2014   Pelvic pain in female 01/19/2014   Anxiety 01/19/2014   Fecal occult blood test positive 01/19/2014   Feces contents abnormal 01/19/2014   Symptom associated with female genital organs 01/19/2014   Night terrors, adult 02/23/2013   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   IUD (intrauterine device) in place 12/16/2012   Thyroid enlarged 12/16/2012   Well woman exam with routine gynecological exam  09/09/2012   Irregular bleeding 08/03/2012   Syncope and collapse 04/26/2011   Fatigue 02/06/2011   POTS (postural orthostatic tachycardia syndrome) 09/15/2010   TIA (transient ischemic attack) 09/15/2010  Tachycardia 09/15/2010   Dizziness 09/10/2010   Hypotension 09/10/2010   Disequilibrium 09/10/2010   Palpitations 09/06/2010   Atrial septal defect, secundum 08/10/2010   Atrial septal defect 08/10/2010   OVARIAN CYST, RIGHT 12/28/2007   Anxiety state 12/03/2005    Past Medical History:  Diagnosis Date   Anxiety    ASD (atrial septal defect), ostium secundum    Status post closure 08/20/10 with Dr. Burt Knack; procedure complicated by right groin hematoma and acute blood loss anemia   Atypical squamous cell changes of undetermined significance (ASCUS) on cervical cytology with negative high risk human papilloma virus (HPV) test result 08/04/2019   07/2019 repeat pap in 3 years per ASCCP guidelines 5 year CIN 3+ risk 0.27%   BV (bacterial vaginosis) 2/75/1700   Complication of anesthesia    VASO-VAGAL RESPONSE IN RECOVERY ROOM AFTER HEART SURGERY   Hematuria    History of psoriasis    scalp   History of TIAs    Hx of Coronary Ca Score 03/2019   CT 03/2019: Cor Calcium Score 0   Hx of dizziness    random episodes "because my BP is so low"   Hypotension    IUD (intrauterine device) in place 12/16/2012   Kidney stone    Meniere's disease    Night terrors    NSVT (nonsustained ventricular tachycardia) ([place])    a. 4 beat run on Zio 2015.   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 Interim medical history since our last visit reviewed.  Review of Systems Per HPI unless specifically  indicated above     Objective:    LMP 09/01/2020 (Exact Date)   Wt Readings from Last 3 Encounters:  05/30/20 147 lb 9.6 oz (67 kg)  05/24/20 151 lb 12.8 oz (68.9 kg)  05/03/20 149 lb 3.2 oz (67.7 kg)    Physical Exam  Results for orders placed or performed during the hospital encounter of 09/05/20  POC Urinalysis dipstick  Result Value Ref Range   Glucose, UA NEGATIVE NEGATIVE mg/dL   Bilirubin Urine NEGATIVE NEGATIVE   Ketones, ur 40 (A) NEGATIVE mg/dL   Specific Gravity, Urine >=1.030 1.005 - 1.030   Hgb urine dipstick LARGE (A) NEGATIVE   pH 6.5 5.0 - 8.0   Protein, ur NEGATIVE NEGATIVE mg/dL   Urobilinogen, UA 2.0 (H) 0.0 - 1.0 mg/dL   Nitrite NEGATIVE NEGATIVE   Leukocytes,Ua TRACE (A) NEGATIVE      Assessment & Plan:   Problem List Items Addressed This Visit   None    Follow up plan: No follow-ups on file.

## 2020-09-08 ENCOUNTER — Telehealth: Payer: BC Managed Care – PPO | Admitting: Nurse Practitioner

## 2020-09-08 ENCOUNTER — Telehealth (INDEPENDENT_AMBULATORY_CARE_PROVIDER_SITE_OTHER): Payer: BC Managed Care – PPO | Admitting: Nurse Practitioner

## 2020-09-08 ENCOUNTER — Encounter: Payer: Self-pay | Admitting: Nurse Practitioner

## 2020-09-08 DIAGNOSIS — R059 Cough, unspecified: Secondary | ICD-10-CM

## 2020-09-08 DIAGNOSIS — U071 COVID-19: Secondary | ICD-10-CM

## 2020-09-08 DIAGNOSIS — K921 Melena: Secondary | ICD-10-CM | POA: Diagnosis not present

## 2020-09-08 DIAGNOSIS — R1033 Periumbilical pain: Secondary | ICD-10-CM

## 2020-09-08 MED ORDER — MOLNUPIRAVIR EUA 200MG CAPSULE: 4.0000 | ORAL_CAPSULE | Freq: Two times a day (BID) | ORAL | 0 refills | Status: AC

## 2020-09-08 MED ORDER — BENZONATATE 100 MG PO CAPS: 100.0000 mg | ORAL_CAPSULE | Freq: Two times a day (BID) | ORAL | 0 refills | Status: DC | PRN

## 2020-09-08 MED ORDER — SALINE SPRAY 0.65 % NA SOLN: 1.0000 | NASAL | 0 refills | Status: DC | PRN

## 2020-09-08 NOTE — Progress Notes (Signed)
Subjective:    Patient ID: Candice Estrada, female    DOB: 07-20-1975, 45 y.o.   MRN: [id number]  HPI: Candice Estrada is a 45 y.o. female presenting virtually for COVID-19 and abdominal pain.  Chief Complaint  Patient presents with   URI   Covid Positive   Abdominal Pain    Patient reports earlier this week, she started having stomach pain, her abdomen was tender to touch and she felt like she was 9 months pregnant.  She has had a bowel obstruction in the past.  Went to [facility] after work and was told to go to ER. Cold symptoms started the next day.  UPPER RESPIRATORY TRACT INFECTION Onset: Wednesday COVID-19 testing history: tested positive at home 09/06/20 COVID-19 vaccination status: has not had vaccines Fever: 101 Cold chills: yes Body aches: yes Cough: dry Shortness of breath: no Wheezing: no Chest pain: no Chest tightness:  yes, sore with coughing Chest congestion: no Nasal congestion: yes Runny nose: yes Post nasal drip: yes Sneezing: yes Sore throat: yes Swollen glands: no Sinus pressure: no Headache: yes Face pain: no Toothache: no Ear pain: no  Ear pressure: no  Eyes red/itching: red, not itching Eye drainage/crusting: no  Nausea: yes  Vomiting: no Diarrhea: no  Change in appetite: yes ; decreased Loss of taste/smell: no  Rash: no Fatigue: yes Sick contacts: no Strep contacts: no  Context: stable Recurrent sinusitis: no Treatments attempted:  Delsym, ibuprofen Relief with OTC medications: yes  No Known Allergies  Outpatient Encounter Medications as of 09/08/2020  Medication Sig   benzonatate (TESSALON) 100 MG capsule Take 1 capsule (100 mg total) by mouth 2 (two) times daily as needed for cough.   molnupiravir EUA 200 mg CAPS Take 4 capsules (800 mg total) by mouth 2 (two) times daily for 5 days.   sodium chloride (OCEAN) 0.65 % SOLN nasal spray Place 1 spray into both nostrils as needed for congestion.   buPROPion (WELLBUTRIN SR) 150 MG 12  hr tablet TAKE 1 TABLET DAILY   cyclobenzaprine (FLEXERIL) 10 MG tablet Take 1 tablet (10 mg total) by mouth 3 (three) times daily as needed for muscle spasms.   ibuprofen (ADVIL) 800 MG tablet Take 1 tablet (800 mg total) by mouth every 8 (eight) hours as needed.   meclizine (ANTIVERT) 25 MG tablet 1 tablet by mouth twice a day as needed for vertigo   Melatonin 3 MG TABS Take 1 tablet by mouth daily.   Multiple Vitamin (MULTIVITAMIN WITH MINERALS) TABS Take 1 tablet by mouth every morning.    pantoprazole (PROTONIX) 40 MG tablet Take 40 mg by mouth daily.   PARAGARD INTRAUTERINE COPPER IU by Intrauterine route.   Probiotic Product (PROBIOTIC PO) Take 1 tablet by mouth daily.   No facility-administered encounter medications on file as of 09/08/2020.    Patient Active Problem List   Diagnosis Date Noted   Mass of lower outer quadrant of right breast 05/24/2020   Symptoms of urinary tract infection 05/24/2020   NSVT (nonsustained ventricular tachycardia) ([place]) 05/17/2020   Autonomic dysfunction - insufficency 05/17/2020   Lightheadedness 05/17/2020   Sebaceous cyst of labia 05/03/2020   Encounter for IUD insertion 03/30/2020   Atypical squamous cell changes of undetermined significance (ASCUS) on cervical cytology with negative high risk human papilloma virus (HPV) test result 08/04/2019   Encounter for screening fecal occult blood testing 07/29/2019   Encounter for gynecological examination with Papanicolaou smear of cervix 07/29/2019   Hot flashes  08/10/2018   Moody 08/10/2018   Weight gain 08/10/2018   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[place]) 07/24/2018   Small bowel obstruction ([place]) 07/08/2017   Partial small bowel obstruction ([place]) 07/08/2017   Enteritis of infectious origin 07/08/2017   Hypokalemia 07/08/2017   Enteritis 07/08/2017   Dehydration    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   History of thyroid nodule 10/21/2016   Vaginal discharge  10/06/2014   BV (bacterial vaginosis) 10/06/2014   Ovarian cyst 01/26/2014   Pelvic pain in female 01/19/2014   Anxiety 01/19/2014   Fecal occult blood test positive 01/19/2014   Feces contents abnormal 01/19/2014   Symptom associated with female genital organs 01/19/2014   Night terrors, adult 02/23/2013   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   IUD (intrauterine device) in place 12/16/2012   Thyroid enlarged 12/16/2012   Well woman exam with routine gynecological exam 09/09/2012   Irregular bleeding 08/03/2012   Syncope and collapse 04/26/2011   Fatigue 02/06/2011   POTS (postural orthostatic tachycardia syndrome) 09/15/2010   TIA (transient ischemic attack) 09/15/2010   Tachycardia 09/15/2010   Dizziness 09/10/2010   Hypotension 09/10/2010   Disequilibrium 09/10/2010   Palpitations 09/06/2010   Atrial septal defect, secundum 08/10/2010   Atrial septal defect 08/10/2010   OVARIAN CYST, RIGHT 12/28/2007   Anxiety state 12/03/2005    Past Medical History:  Diagnosis Date   Anxiety    ASD (atrial septal defect), ostium secundum    Status post closure 08/20/10 with Dr. Burt Knack; procedure complicated by right groin hematoma and acute blood loss anemia   Atypical squamous cell changes of undetermined significance (ASCUS) on cervical cytology with negative high risk human papilloma virus (HPV) test result 08/04/2019   07/2019 repeat pap in 3 years per ASCCP guidelines 5 year CIN 3+ risk 0.27%   BV (bacterial vaginosis) 2/54/2706   Complication of anesthesia    VASO-VAGAL RESPONSE IN RECOVERY ROOM AFTER HEART SURGERY   Hematuria    History of psoriasis    scalp   History of TIAs    Hx of Coronary Ca Score 03/2019   CT 03/2019: Cor Calcium Score 0   Hx of dizziness    random episodes "because my BP is so low"   Hypotension    IUD (intrauterine device) in place 12/16/2012   Kidney stone    Meniere's disease    Night terrors    NSVT (nonsustained ventricular tachycardia) ([place])    a.  4 beat run on Zio 2015.   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 Interim medical history since our last visit reviewed.  Review of Systems Per HPI unless specifically indicated above     Objective:    LMP 09/01/2020 (Exact Date)   Wt Readings from Last 3 Encounters:  05/30/20 147 lb 9.6 oz (67 kg)  05/24/20 151 lb 12.8 oz (68.9 kg)  05/03/20 149 lb 3.2 oz (67.7 kg)    Physical Exam Physical examination unable to be performed due to lack of equipment.     Assessment & Plan:  1. COVID-19 Acute.  Contraindication with Wellbutrin & Paxlovid; start molnupiravir twice daily for 5 daily.  Continue symptomatic treatment - Tessalon perles for cough, ocean nasal spray to help with post nasal drip.   - molnupiravir EUA 200 mg CAPS; Take 4 capsules (800 mg total) by mouth 2 (two) times daily for 5 days.  Dispense: 40 capsule; Refill: 0  2. Periumbilical abdominal pain Acute, ongoing.  Unclear etiology and unfortunately, unable to see this patient in the office due to COVID-19 infection.  Video visit was unable to be performed per below.  Given dark, sticky stools and distended, tender abdomen, I think it would be best for patient to be seen for an in-person in the emergency room.  Will also refer to GI STAT for when she leaves the hospital if she needs any further work up or treatment.  3. Cough  - benzonatate (TESSALON) 100 MG capsule; Take 1 capsule (100 mg total) by mouth 2 (two) times daily as needed for cough.  Dispense: 20 capsule; Refill: 0 - sodium chloride (OCEAN) 0.65 % SOLN nasal spray; Place 1 spray into both nostrils as needed for congestion.  Dispense: 88 mL; Refill: 0     Follow  up plan: Return if symptoms worsen or fail to improve.   This visit was completed via telephone due to the restrictions of the COVID-19 pandemic. All issues as above were discussed and addressed but no physical exam was performed. If it was felt that the patient should be evaluated in the office, they were directed there. The patient verbally consented to this visit. Patient was unable to complete an audio/visual visit due to Technical difficulties.  Link to connect to virtual visit sent x2  and patient never received text with video link. Location of the patient: home Location of the provider: work Those involved with this call:  Provider: Noemi Chapel, DNP, FNP-C CMA: Asencion Partridge, LPN Front Desk/Registration: n/a  Time spent on call:  22 minutes on the phone discussing health concerns. 15 minutes total spent in review of patient's record and preparation of their chart. I verified patient identity using two factors (patient name and date of birth). Patient consents verbally to being seen via telemedicine visit today.

## 2020-11-13 DIAGNOSIS — Z029 Encounter for administrative examinations, unspecified: Secondary | ICD-10-CM

## 2020-11-21 ENCOUNTER — Other Ambulatory Visit: Payer: Self-pay | Admitting: Nurse Practitioner

## 2020-11-21 DIAGNOSIS — U071 COVID-19: Secondary | ICD-10-CM

## 2020-11-22 ENCOUNTER — Other Ambulatory Visit: Payer: Self-pay

## 2020-12-12 ENCOUNTER — Encounter: Payer: Self-pay | Admitting: Podiatry

## 2020-12-12 ENCOUNTER — Other Ambulatory Visit: Payer: Self-pay

## 2020-12-12 ENCOUNTER — Ambulatory Visit (INDEPENDENT_AMBULATORY_CARE_PROVIDER_SITE_OTHER): Admitting: Podiatry

## 2020-12-12 DIAGNOSIS — M2012 Hallux valgus (acquired), left foot: Secondary | ICD-10-CM | POA: Diagnosis not present

## 2020-12-12 DIAGNOSIS — M21962 Unspecified acquired deformity of left lower leg: Secondary | ICD-10-CM

## 2020-12-12 DIAGNOSIS — M7672 Peroneal tendinitis, left leg: Secondary | ICD-10-CM

## 2020-12-12 DIAGNOSIS — Z9889 Other specified postprocedural states: Secondary | ICD-10-CM

## 2020-12-12 DIAGNOSIS — M9272 Juvenile osteochondrosis of metatarsus, left foot: Secondary | ICD-10-CM

## 2020-12-12 NOTE — Progress Notes (Signed)
She presents today for follow-up of her surgical foot left states that it still swells a lot she gets a lot of cramping having to take the Flexeril at bedtime to prevent the cramping.  States that it hurts and swells after she has been on it for about an hour or so.  States that even with it hanging down at work and her seated position is still swells.  She states that she hopes that he will get a little better than it is now at least.  States that she wears her orthotics most of the time usually not it works and she is in a seated position otherwise she is wearing them at home or when she is trying to exercise.  Definitely wears them for physical therapy.  Objective: Vital signs are stable she is alert and oriented x3 no radiographs were taken today.  Pulses are strong and palpable foot is rectus second metatarsophalangeal joint is in a rectus position she has good range of motion of the first and second metatarsophalangeal joints no pain on palpation of the first TMT joint.  The foot does not demonstrate any warm spots no erythema cellulitis drainage or odor.  Assessment: Chronically painful foot secondary to previous surgeries.  Mild tendinitis remaining but physical therapy is helping this to some degree.  Plan: Discussed etiology pathology conservative surgical therapies at this point I am not sure that this foot is ever going to get any better then what it is hopefully will continue to progress slightly.  I am encouraging her to continue physical therapy and we will switch the location of that therapy for her to make it more easily accessible however I feel that this foot is going to be 1 that we will have to watch and "tweak" occasionally.  I will continue to refill her Flexeril for her and I would like to continue to follow this foot approximately every 4 months.  Radiographs will be taken next time for reevaluation of her fusions and internal fixation.

## 2021-02-09 DIAGNOSIS — Z1212 Encounter for screening for malignant neoplasm of rectum: Secondary | ICD-10-CM | POA: Diagnosis not present

## 2021-02-09 DIAGNOSIS — Z1211 Encounter for screening for malignant neoplasm of colon: Secondary | ICD-10-CM | POA: Diagnosis not present

## 2021-02-17 LAB — COLOGUARD: COLOGUARD: NEGATIVE

## 2021-04-17 ENCOUNTER — Encounter: Payer: Self-pay | Admitting: Podiatry

## 2021-04-17 ENCOUNTER — Ambulatory Visit (INDEPENDENT_AMBULATORY_CARE_PROVIDER_SITE_OTHER): Payer: BC Managed Care – PPO | Admitting: Podiatry

## 2021-04-17 ENCOUNTER — Ambulatory Visit (INDEPENDENT_AMBULATORY_CARE_PROVIDER_SITE_OTHER): Payer: BC Managed Care – PPO

## 2021-04-17 DIAGNOSIS — M21962 Unspecified acquired deformity of left lower leg: Secondary | ICD-10-CM

## 2021-04-17 NOTE — Progress Notes (Signed)
She presents today for follow-up of her surgical foot left.  She states that it still swells a lot and has been giving him grief more recently.  States that the ankle which had surgery initially has been hurting as well but I still feel the tenderness underneath the foot.  She states that she still takes her Flexeril at bedtime to help prevent the cramping which seems to do a good job but is hard to wake up from in the morning.  She still feels encouraged that is not hurting all the time however she states that without the orthotics it is a lot more painful and she is still limited in the activities that she can perform.  She states that she wears the orthotics most of the time.  If she stays in a seated position for very long she gets a lot of swelling and she is trying to exercise and continues physical therapy. ? ?Objective: Vital signs are stable she is alert and oriented x3 radiographs taken today demonstrate completely fused first metatarsal cuneiform joint internal fixation is solid in good position.  Pulses are strong foot is rectus slightly elevated first metatarsal with some limited range of motion.  Still has swelling in the foot and is tender on palpation around the ankle surgical site as well as the surgical site over the second metatarsal phalangeal joint.  There is no warmth to the second metatarsophalangeal joint or the ankle.  No erythema cellulitis drainage or odor.  No open lesions or wounds.  She does have a mild callus subthird metatarsal left foot most likely transfer callus. ? ?Assessment: Chronically painful foot secondary to previous multiple surgeries mild capsulitis remaining. ? ?Plan: Discussed etiology pathology conservative surgical therapies I am not sure that this foot will ever get better than what it is right now on that we does seemingly progress slightly 1 where the other waxing and waning better and worse.  I encouraged her to continue with physical therapy she states that she  does like [facility] in [place] as opposed to the one closer to her house.  I will follow her about every 4 months.  There is no need for radiographs next visit unless there is worsening of her complaints. ?

## 2021-05-01 ENCOUNTER — Telehealth: Payer: Self-pay | Admitting: Podiatry

## 2021-05-01 NOTE — Telephone Encounter (Signed)
Dr. Milinda Pointer is going to work on this tomorrow in [place] and we'll get it to you on Thursday. ?

## 2021-05-01 NOTE — Telephone Encounter (Signed)
Mrs. Akens said that she does believe that she has reached Maximum medical improvement. 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 ?

## 2021-05-10 ENCOUNTER — Encounter: Payer: Self-pay | Admitting: Podiatry

## 2021-05-10 ENCOUNTER — Encounter: Payer: Self-pay | Admitting: *Deleted

## 2021-05-11 NOTE — Telephone Encounter (Signed)
Documentation requested has been completed and letter given to Candice Estrada ?

## 2021-07-19 ENCOUNTER — Ambulatory Visit: Payer: BC Managed Care – PPO | Admitting: Podiatry

## 2021-07-26 ENCOUNTER — Other Ambulatory Visit: Payer: Self-pay | Admitting: Adult Health

## 2021-07-31 ENCOUNTER — Ambulatory Visit: Payer: BC Managed Care – PPO | Admitting: Internal Medicine

## 2021-08-08 ENCOUNTER — Other Ambulatory Visit (HOSPITAL_COMMUNITY): Payer: Self-pay | Admitting: Family Medicine

## 2021-08-08 DIAGNOSIS — Z1231 Encounter for screening mammogram for malignant neoplasm of breast: Secondary | ICD-10-CM

## 2021-08-21 ENCOUNTER — Ambulatory Visit (INDEPENDENT_AMBULATORY_CARE_PROVIDER_SITE_OTHER): Admitting: Podiatry

## 2021-08-21 DIAGNOSIS — M778 Other enthesopathies, not elsewhere classified: Secondary | ICD-10-CM

## 2021-08-21 DIAGNOSIS — M21962 Unspecified acquired deformity of left lower leg: Secondary | ICD-10-CM | POA: Diagnosis not present

## 2021-08-21 NOTE — Progress Notes (Signed)
She presents today for follow-up of her first metatarsal phalangeal joint and second metatarsophalangeal joint states that is still aches and burns still hurts on a daily basis and she has been getting severe cramping in the medial longitudinal arch.  States that when she takes a portion of her cyclobenzaprine it really puts her out so she does not like taking it.  She states that her foot is very sensitive on the top.  She does relate that the pain throughout the foot is not nearly as severe as it was prior to surgery.  Objective: Vital signs stable alert oriented x3 she has pain on attempted range of motion of the first metatarsal phalangeal joint she also has pain and allodynic type pain on palpation of the dorsum of the foot near the second and third metatarsal phalangeal joints.  Toes are in good alignment everything looks very nice but she still having pain.  Assessment muscle spasms allodynia postop pain remains.  Plan: Remove to try topical analgesics and we discussed several over-the-counter ways to try to help with cramps.  I will follow-up with her in 6 months

## 2021-08-23 ENCOUNTER — Encounter: Payer: Self-pay | Admitting: Podiatry

## 2021-08-24 ENCOUNTER — Other Ambulatory Visit: Payer: Self-pay | Admitting: Podiatry

## 2021-08-24 MED ORDER — CYCLOBENZAPRINE HCL 5 MG PO TABS: 5.0000 mg | ORAL_TABLET | Freq: Every day | ORAL | 0 refills | Status: AC

## 2021-08-30 ENCOUNTER — Encounter: Payer: Self-pay | Admitting: Podiatry

## 2021-09-05 ENCOUNTER — Inpatient Hospital Stay (HOSPITAL_COMMUNITY): Admission: RE | Admit: 2021-09-05 | Payer: BC Managed Care – PPO | Source: Ambulatory Visit

## 2021-09-05 DIAGNOSIS — Z1231 Encounter for screening mammogram for malignant neoplasm of breast: Secondary | ICD-10-CM

## 2021-09-13 ENCOUNTER — Encounter: Payer: Self-pay | Admitting: *Deleted

## 2021-09-13 ENCOUNTER — Encounter: Payer: Self-pay | Admitting: Podiatry

## 2021-09-19 ENCOUNTER — Other Ambulatory Visit (HOSPITAL_COMMUNITY): Payer: Self-pay | Admitting: Family Medicine

## 2021-09-19 DIAGNOSIS — Z1231 Encounter for screening mammogram for malignant neoplasm of breast: Secondary | ICD-10-CM

## 2021-09-24 ENCOUNTER — Ambulatory Visit (HOSPITAL_COMMUNITY)
Admission: RE | Admit: 2021-09-24 | Discharge: 2021-09-24 | Disposition: A | Discharge status: Home / Self Care | Payer: BC Managed Care – PPO | Source: Ambulatory Visit | Attending: Family Medicine | Admitting: Family Medicine

## 2021-09-24 DIAGNOSIS — Z1231 Encounter for screening mammogram for malignant neoplasm of breast: Secondary | ICD-10-CM | POA: Diagnosis not present

## 2021-11-07 DIAGNOSIS — H318 Other specified disorders of choroid: Secondary | ICD-10-CM | POA: Diagnosis not present

## 2021-11-07 DIAGNOSIS — Z83511 Family history of glaucoma: Secondary | ICD-10-CM | POA: Diagnosis not present

## 2021-11-07 DIAGNOSIS — Z83518 Family history of other specified eye disorder: Secondary | ICD-10-CM | POA: Diagnosis not present

## 2021-11-07 DIAGNOSIS — H16143 Punctate keratitis, bilateral: Secondary | ICD-10-CM | POA: Diagnosis not present

## 2021-11-14 NOTE — Progress Notes (Signed)
PCP:  Pcp, No Primary Cardiologist: Sherren Mocha, MD Electrophysiologist: Virl Axe, MD   Candice Estrada is a 46 y.o. female seen today for Virl Axe, MD for routine electrophysiology followup. Since last being seen in our clinic the patient reports doing OK. She does feel like she has had more palpitations over the past several months. She states she is doing very poorly at hydrating.  Her palpitations occur 3-4 times a week and feel like a solitary, hard beat, with a skipped beat after, but can occur grouped for about 10 seconds or so.  she denies chest pain, dyspnea, PND, orthopnea, nausea, vomiting, dizziness, syncope, edema, weight gain, or early satiety.   DATE TEST EF    1/20 ECHO  60-65%    3/20 cMRI 60-65% No scar  6/20 ECHO  (left?)60-65 %    3/21 CaScore   = 0   3/22 Echo 60-65% Neg Bubble study    DATE K Cr Hbg  2/22 4.4  0.99  14.8              Past Medical History:  Diagnosis Date   Anxiety    ASD (atrial septal defect), ostium secundum    Status post closure 08/20/10 with Dr. Burt Knack; procedure complicated by right groin hematoma and acute blood loss anemia   Atypical squamous cell changes of undetermined significance (ASCUS) on cervical cytology with negative high risk human papilloma virus (HPV) test result 08/04/2019   07/2019 repeat pap in 3 years per ASCCP guidelines 5 year CIN 3+ risk 0.27%   BV (bacterial vaginosis) 0/94/7096   Complication of anesthesia    VASO-VAGAL RESPONSE IN RECOVERY ROOM AFTER HEART SURGERY   Hematuria    History of psoriasis    scalp   History of TIAs    Hx of Coronary Ca Score 03/2019   CT 03/2019: Cor Calcium Score 0   Hx of dizziness    random episodes "because my BP is so low"   Hypotension    IUD (intrauterine device) in place 12/16/2012   Kidney stone    Meniere's disease    Night terrors    NSVT (nonsustained ventricular tachycardia) ([place])    a. 4 beat run on Zio 2015.   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    Thyroid enlarged 12/16/2012   History of cyst will get [place]   Past Surgical History:  Procedure Laterality Date   ANKLE ARTHROSCOPY WITH RECONSTRUCTION Left 02/19/2018   Procedure: LEFT ANKLE ARTHROSCOPIC DEBRIDEMENT, LATERAL LIGAMENT RECONSTRUCTION,  OSTEOCHONDRAL TREATMENT;  Surgeon: Erle Crocker, MD;  Location: [facility];  Service: Orthopedics;  Laterality: Left;   ASD REPAIR  08/20/2010   CARDIAC SURGERY     CYSTOSCOPY WITH RETROGRADE PYELOGRAM, URETEROSCOPY AND STENT PLACEMENT Left 08/31/2014   Procedure: CYSTOSCOPY WITH BILATERAL RETROGRADE PYELOGRAM, URETEROSCOPY AND STENT PLACEMENT, STONE EXTRACTION WITH BASKET;  Surgeon: Cleon Gustin, MD;  Location: [facility];  Service: Urology;  Laterality: Left;   FOOT NEUROMA SURGERY  2009   left   kidney stone removed     THYROID CYST EXCISION     TONSILLECTOMY     vascular necrosis left foot      Current Outpatient Medications  Medication Sig Dispense Refill   buPROPion (WELLBUTRIN SR) 150 MG 12 hr tablet TAKE 1 TABLET DAILY  90 tablet 3   cyclobenzaprine (FLEXERIL) 5 MG tablet Take 1 tablet (5 mg total) by mouth at bedtime. 30 tablet 0   ibuprofen (ADVIL) 800 MG tablet TAKE 1 TABLET EVERY 8 HOURS AS NEEDED 90 tablet 11   PARAGARD INTRAUTERINE COPPER IU by Intrauterine route.     No current facility-administered medications for this visit.    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  Other Topics Concern   Not on file  Social History Narrative   Patient lives at home with her family.   Caffeine Use: rarely   Social Determinants of Health   Financial Resource Strain: Low Risk  (07/29/2019)   Overall Financial Resource Strain (CARDIA)    Difficulty of Paying Living Expenses: Not very hard  Food Insecurity: No Food Insecurity (07/29/2019)   Hunger Vital Sign    Worried About Running Out of Food in the Last Year: Never true    Ran Out of Food in the Last Year: Never true  Transportation Needs: No Transportation Needs (07/29/2019)   PRAPARE - Hydrologist (Medical): No    Lack of Transportation (Non-Medical): No  Physical Activity: Sufficiently Active (07/29/2019)   Exercise Vital Sign    Days of Exercise per Week: 7 days    Minutes of Exercise per Session: 40 min  Stress: Stress Concern Present (07/29/2019)   [place]    Feeling of Stress : To some extent  Social Connections: Socially Integrated (07/29/2019)   Social Connection and Isolation Panel [NHANES]    Frequency of Communication with Friends and Family: More than three times a week    Frequency of Social Gatherings with Friends and Family: Once a week    Attends Religious Services: 1 to 4 times per year    Active Member of [employer] or Organizations: No    Attends Music therapist: More than 4 times per year    Marital Status: Married  Human resources officer Violence: Not At Risk (07/29/2019)   Humiliation, Afraid, Rape, and Kick questionnaire    Fear of Current or Ex-Partner: No    Emotionally Abused: No    Physically Abused: No    Sexually Abused: No     Review of Systems: All other systems reviewed and are otherwise  negative except as noted above.  Physical Exam: Vitals:   11/19/21 1138 11/19/21 1144  BP: 113/79 113/79  Pulse: 66 66  SpO2: 97%   Weight: 162 lb (73.5 kg)   Height: '5\' 3"'$  (1.6 m)    Orthostatic VS for the past 24 hrs (Last 3 readings):  BP- Lying Pulse- Lying BP- Sitting Pulse- Sitting BP- Standing at 0 minutes Pulse- Standing at 0 minutes BP- Standing at 3 minutes Pulse-  Standing at 3 minutes  11/19/21 1144 113/79 66 117/82 62 117/78 67 120/90 79    GEN- The patient is well appearing, alert and oriented x 3 today.   HEENT: normocephalic, atraumatic; sclera clear, conjunctiva pink; hearing intact; oropharynx clear; neck supple, no JVP Lymph- no cervical lymphadenopathy Lungs- Clear to ausculation bilaterally, normal work of breathing.  No wheezes, rales, rhonchi Heart- Regular rate and rhythm, no murmurs, rubs or gallops, PMI not laterally displaced GI- soft, non-tender, non-distended, bowel sounds present, no hepatosplenomegaly Extremities- No peripheral edema. no clubbing or cyanosis; DP/PT/radial pulses 2+ bilaterally MS- no significant deformity or atrophy Skin- warm and dry, no rash or lesion Psych- euthymic mood, full affect Neuro- strength and sensation are intact  EKG is ordered. Personal review of EKG from today shows NSR at 66 bpm  Additional studies reviewed include: Previous EP notes.   Assessment and Plan:  VT NS   Autonomic insufficiency   Lightheadedness    Ocular neoplasm "surface squamous neoplasia"   ASD repair AS Amplatzer   POTS ? Vertigo     Dizziness has greatly improved    Palpitations occur 3-4 times a week.  Stressed important of fluids, currently she is drinking only 32-48 oz daily.   Offered daily diltiazem to try if hydration is not enough. Orthostatics negative today, so if palpitations persistent could consider low dose BB.   We discussed the role of salt and water repletion, the importance of exercise, often needing to be started  in the recumbent position, and the awareness of triggers and the role of ambient heat and dehydration   Follow up with Dr. Caryl Comes in 6 months  Shirley Friar, PA-C  11/19/21 11:54 AM

## 2021-11-19 ENCOUNTER — Encounter: Payer: Self-pay | Admitting: Student

## 2021-11-19 ENCOUNTER — Ambulatory Visit: Payer: BC Managed Care – PPO | Attending: Student | Admitting: Student

## 2021-11-19 VITALS — BP 113/79 | HR 66 | Ht 63.0 in | Wt 162.0 lb

## 2021-11-19 DIAGNOSIS — I4729 Other ventricular tachycardia: Secondary | ICD-10-CM | POA: Diagnosis not present

## 2021-11-19 DIAGNOSIS — G909 Disorder of the autonomic nervous system, unspecified: Secondary | ICD-10-CM

## 2021-11-19 DIAGNOSIS — R42 Dizziness and giddiness: Secondary | ICD-10-CM | POA: Diagnosis not present

## 2021-11-19 MED ORDER — DILTIAZEM HCL ER COATED BEADS 120 MG PO CP24: 120.0000 mg | ORAL_CAPSULE | Freq: Every day | ORAL | 3 refills | Status: DC

## 2021-11-19 NOTE — Patient Instructions (Signed)
Medication Instructions:  Your physician has recommended you make the following change in your medication:  START: Diltiazem '120mg'$  daily at bedtime (if you need to start)  *If you need a refill on your cardiac medications before your next appointment, please call your pharmacy*   Lab Work: None If you have labs (blood work) drawn today and your tests are completely normal, you will receive your results only by: [place] (if you have MyChart) OR A paper copy in the mail If you have any lab test that is abnormal or we need to change your treatment, we will call you to review the results.   Follow-Up: At [facility], you and your health needs are our priority.  As part of our continuing mission to provide you with exceptional heart care, we have created designated Provider Care Teams.  These Care Teams include your primary Cardiologist (physician) and Advanced Practice Providers (APPs -  Physician Assistants and Nurse Practitioners) who all work together to provide you with the care you need, when you need it.   Your next appointment:   6 month(s)  The format for your next appointment:   In Person  Provider:   Virl Axe, MD    Important Information About Sugar

## 2021-11-29 ENCOUNTER — Ambulatory Visit: Payer: BC Managed Care – PPO | Attending: Student

## 2021-11-29 ENCOUNTER — Encounter: Payer: Self-pay | Admitting: Internal Medicine

## 2021-11-29 DIAGNOSIS — R001 Bradycardia, unspecified: Secondary | ICD-10-CM

## 2021-11-29 DIAGNOSIS — R002 Palpitations: Secondary | ICD-10-CM

## 2021-11-29 DIAGNOSIS — I4729 Other ventricular tachycardia: Secondary | ICD-10-CM | POA: Diagnosis not present

## 2021-11-29 NOTE — Progress Notes (Unsigned)
Enrolled patient for a 7 day Zio XT monitor to be mailed to patients home   Dr Caryl Comes to read

## 2021-12-10 ENCOUNTER — Other Ambulatory Visit: Payer: Self-pay

## 2021-12-10 MED ORDER — BUPROPION HCL ER (SR) 150 MG PO TB12: 150.0000 mg | ORAL_TABLET | Freq: Every day | ORAL | 3 refills | Status: DC

## 2021-12-19 DIAGNOSIS — I4729 Other ventricular tachycardia: Secondary | ICD-10-CM | POA: Diagnosis not present

## 2021-12-19 DIAGNOSIS — R002 Palpitations: Secondary | ICD-10-CM | POA: Diagnosis not present

## 2021-12-31 ENCOUNTER — Encounter: Payer: Self-pay | Admitting: Family Medicine

## 2021-12-31 ENCOUNTER — Ambulatory Visit (INDEPENDENT_AMBULATORY_CARE_PROVIDER_SITE_OTHER): Payer: BC Managed Care – PPO | Admitting: Family Medicine

## 2021-12-31 VITALS — BP 102/86 | HR 85 | Temp 97.6°F | Ht 63.0 in | Wt 159.0 lb

## 2021-12-31 DIAGNOSIS — R232 Flushing: Secondary | ICD-10-CM

## 2021-12-31 DIAGNOSIS — Z8639 Personal history of other endocrine, nutritional and metabolic disease: Secondary | ICD-10-CM

## 2021-12-31 DIAGNOSIS — Z1159 Encounter for screening for other viral diseases: Secondary | ICD-10-CM

## 2021-12-31 DIAGNOSIS — Z0000 Encounter for general adult medical examination without abnormal findings: Secondary | ICD-10-CM

## 2021-12-31 DIAGNOSIS — M87076 Idiopathic aseptic necrosis of unspecified foot: Secondary | ICD-10-CM

## 2021-12-31 DIAGNOSIS — Q211 Atrial septal defect, unspecified: Secondary | ICD-10-CM

## 2021-12-31 DIAGNOSIS — Z8673 Personal history of transient ischemic attack (TIA), and cerebral infarction without residual deficits: Secondary | ICD-10-CM

## 2021-12-31 DIAGNOSIS — C6991 Malignant neoplasm of unspecified site of right eye: Secondary | ICD-10-CM

## 2021-12-31 DIAGNOSIS — R519 Headache, unspecified: Secondary | ICD-10-CM | POA: Diagnosis not present

## 2021-12-31 DIAGNOSIS — N912 Amenorrhea, unspecified: Secondary | ICD-10-CM | POA: Diagnosis not present

## 2021-12-31 MED ORDER — DIAZEPAM 5 MG PO TABS: 5.0000 mg | ORAL_TABLET | Freq: Once | ORAL | 0 refills | Status: AC

## 2021-12-31 NOTE — Progress Notes (Signed)
New Patient Office Visit  Subjective    Patient ID: Candice Estrada, female    DOB: [birth year]  Age: 46 y.o. MRN: [id number]  CC:  Chief Complaint  Patient presents with   Establish Care    HPI Candice Estrada presents to establish care. Oriented to practice routines and expectations. Has not been seen by PCP for a few. PMH significant for TIA, ASD closure 13 years ago and palpitations and low HR since, reporting her heart feels like it is "flipping over in her chest" with dizziness. Candice Estrada has been seeing cardiology.   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 The headaches started suddenly a month ago and they occurred every day for 2 weeks and now occur twice a week. Candice Estrada cannot identify a trigger or anything that makes them worse. They are relieved by '800mg'$  Ibuprofen. Candice Estrada reports pain from her right eye to behind her ear, sharp icepick pain. Candice Estrada takes Ibuprofen TID to "stay ahead of it" on those days. Reports floaters at the time of headache with nausea. Denies chest pain, dyspnea, numbness, sensory changes, nausea, vomiting, tearing, runny nose, confusion, unilateral weakness.  Candice Estrada has been ammenorrhagic for 3 months. Candice Estrada does have copper IUD, Candice Estrada [facility]. Candice Estrada requests hormone levels today. Reports hot flashes.  Health maintenance items up to date. Candice Estrada declines vaccines at this time.   Outpatient Encounter Medications as of 12/31/2021  Medication Sig   buPROPion (WELLBUTRIN SR) 150 MG 12 hr tablet Take 1 tablet (150 mg total) by mouth daily.   cyclobenzaprine (FLEXERIL) 5 MG tablet Take 1 tablet (5 mg total) by mouth at bedtime.   diazepam (VALIUM) 5 MG tablet Take 1 tablet (5 mg total) by mouth once for 1 dose. Take 1 tablet ('5mg'$  total) by  mouth once before MRI   diltiazem (CARDIZEM CD) 120 MG 24 hr capsule Take 1 capsule (120 mg total) by mouth at bedtime.   ibuprofen (ADVIL) 800 MG tablet TAKE 1 TABLET EVERY 8 HOURS AS NEEDED   PARAGARD INTRAUTERINE COPPER IU by Intrauterine route.   No facility-administered encounter medications on file as of 12/31/2021.    Past Medical History:  Diagnosis Date   Anxiety    ASD (atrial septal defect), ostium secundum    Status post closure 08/20/10 with Dr. Burt Knack; procedure complicated by right groin hematoma and acute blood loss anemia   Atypical squamous cell changes of undetermined significance (ASCUS) on cervical cytology with negative high risk human papilloma virus (HPV) test result 08/04/2019   07/2019 repeat pap in 3 years per ASCCP guidelines 5 year CIN 3+ risk 0.27%   BV (bacterial vaginosis) 0/93/2671   Complication of anesthesia    VASO-VAGAL RESPONSE IN RECOVERY ROOM AFTER HEART SURGERY   Hematuria    History of psoriasis    scalp   History of TIAs    Hx of Coronary Ca Score 03/2019   CT 03/2019: Cor Calcium Score 0   Hx of dizziness    random episodes "because my BP is so low"   Hypotension    IUD (intrauterine device) in place 12/16/2012   Kidney stone    Meniere's disease    Night terrors    NSVT (nonsustained ventricular tachycardia) ([place])  a. 4 beat run on Zio 2015.   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    Thyroid enlarged 12/16/2012   History of cyst will get [place]    Past Surgical History:  Procedure Laterality Date   ANKLE ARTHROSCOPY WITH RECONSTRUCTION Left 02/19/2018   Procedure: LEFT ANKLE ARTHROSCOPIC DEBRIDEMENT, LATERAL LIGAMENT RECONSTRUCTION,  OSTEOCHONDRAL TREATMENT;  Surgeon: Erle Crocker, MD;  Location: [place];  Service: Orthopedics;  Laterality: Left;   ASD REPAIR  08/20/2010    CARDIAC SURGERY     CYSTOSCOPY WITH RETROGRADE PYELOGRAM, URETEROSCOPY AND STENT PLACEMENT Left 08/31/2014   Procedure: CYSTOSCOPY WITH BILATERAL RETROGRADE PYELOGRAM, URETEROSCOPY AND STENT PLACEMENT, STONE EXTRACTION WITH BASKET;  Surgeon: Cleon Gustin, MD;  Location: [facility];  Service: Urology;  Laterality: Left;   FOOT NEUROMA SURGERY  2009   left   kidney stone removed     THYROID CYST EXCISION     TONSILLECTOMY     vascular necrosis left foot      Family History  Problem Relation Age of Onset   Hypertension Father 47   Stroke Father    Heart attack Father 49   Brain cancer Father    Heart disease Father    Diabetes Father    Liver disease Mother 60       dead   Cancer Mother        breast   Hypertension Brother 48   Cancer Paternal Grandmother        breast   Dementia Maternal Grandmother    Cancer Maternal Grandmother        thyroid   Cancer Paternal Aunt        breast    Kidney failure Daughter    Cancer Other        mom's grandma-breast    Social History   Socioeconomic History   Marital status: Married    Spouse name: Not on file   Number of children: 2   Years of education: College   Highest education level: Not on file  Occupational History   Occupation: LPN    Employer: [employer]    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  Other Topics Concern   Not on file  Social History Narrative   Patient lives at home with her family.   Caffeine Use: rarely   Social Determinants of Health   Financial Resource Strain: Low Risk  (07/29/2019)   Overall Financial Resource Strain (CARDIA)    Difficulty of Paying Living Expenses: Not very hard  Food  Insecurity: No Food Insecurity (07/29/2019)   Hunger Vital Sign    Worried About Running Out of Food in the Last Year: Never true    Ran Out of Food in the Last Year: Never true  Transportation Needs: No Transportation Needs (07/29/2019)   PRAPARE - Hydrologist (Medical): No    Lack of Transportation (Non-Medical): No  Physical Activity: Sufficiently Active (07/29/2019)  Exercise Vital Sign    Days of Exercise per Week: 7 days    Minutes of Exercise per Session: 40 min  Stress: Stress Concern Present (07/29/2019)   [place]    Feeling of Stress : To some extent  Social Connections: Socially Integrated (07/29/2019)   Social Connection and Isolation Panel [NHANES]    Frequency of Communication with Friends and Family: More than three times a week    Frequency of Social Gatherings with Friends and Family: Once a week    Attends Religious Services: 1 to 4 times per year    Active Member of [employer] or Organizations: No    Attends Music therapist: More than 4 times per year    Marital Status: Married  Human resources officer Violence: Not At Risk (07/29/2019)   Humiliation, Afraid, Rape, and Kick questionnaire    Fear of Current or Ex-Partner: No    Emotionally Abused: No    Physically Abused: No    Sexually Abused: No    Review of Systems  All other systems reviewed and are negative.       Objective    BP 102/86   Pulse 85   Temp 97.6 F (36.4 C) (Oral)   Ht '5\' 3"'$  (1.6 m)   Wt 159 lb (72.1 kg)   SpO2 98%   BMI 28.17 kg/m   Physical Exam Vitals and nursing note reviewed.  Constitutional:      Appearance: Normal appearance. Candice Estrada is normal weight.  HENT:     Head: Normocephalic and atraumatic.     Right Ear: Tympanic membrane, ear canal and external ear normal.     Left Ear: Tympanic membrane, ear canal and external ear normal.     Nose: Nose normal.     Mouth/Throat:      Mouth: Mucous membranes are moist.     Pharynx: Oropharynx is clear.  Eyes:     Extraocular Movements: Extraocular movements intact.     Conjunctiva/sclera: Conjunctivae normal.     Pupils: Pupils are equal, round, and reactive to light.     Comments: Right medial scleral discoloration  Cardiovascular:     Rate and Rhythm: Normal rate and regular rhythm.     Pulses: Normal pulses.     Heart sounds: Normal heart sounds.  Pulmonary:     Effort: Pulmonary effort is normal.     Breath sounds: Normal breath sounds.  Abdominal:     General: Bowel sounds are normal.     Palpations: Abdomen is soft.  Musculoskeletal:        General: Normal range of motion.     Cervical back: Normal range of motion and neck supple.  Skin:    General: Skin is warm and dry.     Capillary Refill: Capillary refill takes less than 2 seconds.  Neurological:     General: No focal deficit present.     Mental Status: Candice Estrada is alert and oriented to person, place, and time. Mental status is at baseline.  Psychiatric:        Mood and Affect: Mood normal.        Behavior: Behavior normal.        Thought Content: Thought content normal.        Judgment: Judgment normal.         Assessment & Plan:   Problem List Items Addressed This Visit       Cardiovascular and Mediastinum   Hot  flashes    Patient has not had a period in 3 months and is likely menopausal. Unable to treat hot flashes with hormonal therapy due to history of TIA and ASD.      Atrial septal defect    History of ASD repair, closely followed by Cardiology. Candice Estrada reports lightheadedness, palpitations, and HR to 40s since her repair 13 years ago. Has been worked up by Cardiology, ZIO patch showed NSR with HR 50s-150s and no arrhythmias. Cardiology ordered Cardizem which Candice Estrada never started taking.        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 Recommend 150 minutes of moderate intensity exercise weekly and consuming a well-balanced diet. Advised to stop smoking if a smoker, avoid smoking if a non-smoker, limit alcohol consumption to 1 drink per day for women and 2 drinks per day for men, and avoid illicit drug use. Counseled on safe sex practices and offered STI testing today. Counseled on the importance of sunscreen use. Counseled in mental health awareness and when to seek medical care. Vaccine maintenance discussed. Appropriate health maintenance items reviewed. Return to office in 1 year for annual physical exam.       Relevant Orders   CBC with Differential/Platelet   COMPLETE METABOLIC PANEL WITH GFR   Lipid panel   TSH   Amenorrhea   Relevant Orders   Estrogens, Total   Follicle stimulating hormone   New onset of headaches    Presentation consistent with migraines, given her history and the sudden onset of these headaches will obtain MRI brain and refer to Neurology to rule out underlying etiology. Continue supportive management and instructed to seek medical care if headaches are persistent or worsening and associated with signs of stroke or TIA.      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   Rubie Maid, FNP

## 2021-12-31 NOTE — Patient Instructions (Signed)
It was great to meet you today and I'm excited to have you join the [facility] practice. I hope you had a positive experience today! If you feel so inclined, please feel free to recommend our practice to friends and family. Leslie Jester, FNP-C  

## 2021-12-31 NOTE — Assessment & Plan Note (Signed)
History of ASD repair, closely followed by Cardiology. She reports lightheadedness, palpitations, and HR to 40s since her repair 13 years ago. Has been worked up by Cardiology, ZIO patch showed NSR with HR 50s-150s and no arrhythmias. Cardiology ordered Cardizem which she never started taking.

## 2021-12-31 NOTE — Assessment & Plan Note (Signed)
Patient has not had a period in 3 months and is likely menopausal. Unable to treat hot flashes with hormonal therapy due to history of TIA and ASD.

## 2021-12-31 NOTE — Assessment & Plan Note (Addendum)
Presentation consistent with migraines, given her history and the sudden onset of these headaches will obtain MRI brain and refer to Neurology to rule out underlying etiology. Continue supportive management and instructed to seek medical care if headaches are persistent or worsening and associated with signs of stroke or TIA.

## 2021-12-31 NOTE — Assessment & Plan Note (Signed)

## 2022-01-02 ENCOUNTER — Other Ambulatory Visit: Payer: BC Managed Care – PPO

## 2022-01-02 DIAGNOSIS — Z136 Encounter for screening for cardiovascular disorders: Secondary | ICD-10-CM | POA: Diagnosis not present

## 2022-01-02 DIAGNOSIS — Z1159 Encounter for screening for other viral diseases: Secondary | ICD-10-CM | POA: Diagnosis not present

## 2022-01-02 DIAGNOSIS — N912 Amenorrhea, unspecified: Secondary | ICD-10-CM | POA: Diagnosis not present

## 2022-01-02 DIAGNOSIS — Z0000 Encounter for general adult medical examination without abnormal findings: Secondary | ICD-10-CM | POA: Diagnosis not present

## 2022-01-03 ENCOUNTER — Encounter: Payer: Self-pay | Admitting: Family Medicine

## 2022-01-08 ENCOUNTER — Ambulatory Visit (HOSPITAL_COMMUNITY)
Admission: RE | Admit: 2022-01-08 | Discharge: 2022-01-08 | Disposition: A | Discharge status: Home / Self Care | Payer: BC Managed Care – PPO | Source: Ambulatory Visit | Attending: Family Medicine | Admitting: Family Medicine

## 2022-01-08 DIAGNOSIS — R519 Headache, unspecified: Secondary | ICD-10-CM | POA: Diagnosis not present

## 2022-01-08 MED ORDER — GADOBUTROL 1 MMOL/ML IV SOLN
8.0000 mL | Freq: Once | INTRAVENOUS | Status: AC | PRN
  Administered 2022-01-08: 7.5 mL via INTRAVENOUS

## 2022-01-09 ENCOUNTER — Encounter: Payer: Self-pay | Admitting: Family Medicine

## 2022-01-09 ENCOUNTER — Ambulatory Visit (INDEPENDENT_AMBULATORY_CARE_PROVIDER_SITE_OTHER): Payer: BC Managed Care – PPO | Admitting: Family Medicine

## 2022-01-09 VITALS — BP 116/80 | HR 79 | Temp 97.7°F | Ht 63.0 in | Wt 161.0 lb

## 2022-01-09 DIAGNOSIS — R040 Epistaxis: Secondary | ICD-10-CM

## 2022-01-09 DIAGNOSIS — R0781 Pleurodynia: Secondary | ICD-10-CM | POA: Diagnosis not present

## 2022-01-09 DIAGNOSIS — R232 Flushing: Secondary | ICD-10-CM

## 2022-01-09 LAB — COMPLETE METABOLIC PANEL WITH GFR
AG Ratio: 1.7 (calc) (ref 1.0–2.5)
ALT: 17 U/L (ref 6–29)
AST: 21 U/L (ref 10–35)
Albumin: 4.7 g/dL (ref 3.6–5.1)
Alkaline phosphatase (APISO): 57 U/L (ref 31–125)
BUN/Creatinine Ratio: 14 (calc) (ref 6–22)
BUN: 15 mg/dL (ref 7–25)
CO2: 27 mmol/L (ref 20–32)
Calcium: 9.9 mg/dL (ref 8.6–10.2)
Chloride: 104 mmol/L (ref 98–110)
Creat: 1.08 mg/dL — ABNORMAL HIGH (ref 0.50–0.99)
Globulin: 2.8 g/dL (calc) (ref 1.9–3.7)
Glucose, Bld: 90 mg/dL (ref 65–99)
Potassium: 4.1 mmol/L (ref 3.5–5.3)
Sodium: 141 mmol/L (ref 135–146)
Total Bilirubin: 0.4 mg/dL (ref 0.2–1.2)
Total Protein: 7.5 g/dL (ref 6.1–8.1)
eGFR: 63 mL/min/{1.73_m2} (ref >=60)

## 2022-01-09 LAB — LIPID PANEL
Cholesterol: 192 mg/dL (ref <200)
HDL: 70 mg/dL (ref >=50)
LDL Cholesterol (Calc): 102 mg/dL (calc) — ABNORMAL HIGH
Non-HDL Cholesterol (Calc): 122 mg/dL (calc) (ref <130)
Total CHOL/HDL Ratio: 2.7 (calc) (ref <5.0)
Triglycerides: 100 mg/dL (ref <150)

## 2022-01-09 LAB — CBC WITH DIFFERENTIAL/PLATELET
Absolute Monocytes: 413 cells/uL (ref 200–950)
Basophils Absolute: 48 cells/uL (ref 0–200)
Basophils Relative: 0.9 %
Eosinophils Absolute: 180 cells/uL (ref 15–500)
Eosinophils Relative: 3.4 %
HCT: 46 % — ABNORMAL HIGH (ref 35.0–45.0)
Hemoglobin: 15.4 g/dL (ref 11.7–15.5)
Lymphs Abs: 1691 cells/uL (ref 850–3900)
MCH: 30.1 pg (ref 27.0–33.0)
MCHC: 33.5 g/dL (ref 32.0–36.0)
MCV: 89.8 fL (ref 80.0–100.0)
MPV: 9.7 fL (ref 7.5–12.5)
Monocytes Relative: 7.8 %
Neutro Abs: 2968 cells/uL (ref 1500–7800)
Neutrophils Relative %: 56 %
Platelets: 302 10*3/uL (ref 140–400)
RBC: 5.12 10*6/uL — ABNORMAL HIGH (ref 3.80–5.10)
RDW: 12 % (ref 11.0–15.0)
Total Lymphocyte: 31.9 %
WBC: 5.3 10*3/uL (ref 3.8–10.8)

## 2022-01-09 LAB — TSH: TSH: 1.78 mIU/L

## 2022-01-09 LAB — HEPATITIS C ANTIBODY: Hepatitis C Ab: NONREACTIVE

## 2022-01-09 LAB — FOLLICLE STIMULATING HORMONE: FSH: 113.9 m[IU]/mL

## 2022-01-09 LAB — ESTROGENS, TOTAL: Estrogen: 80 pg/mL

## 2022-01-09 NOTE — Assessment & Plan Note (Signed)
No nodule or mass on palpation, rib cage is symmetrical. Tenderness overlying left lower ribs to palpation, not at rest. No dyspnea, chest pain, palpitations, lightheadedness, diaphoresis, radiating pain, N/V. Chest wall is symmetrical. This appears to be musculoskeletal pain in the setting of heavy lifting of 12 christmas trees. Instructed to try NSAIDs and rest and if pain not improved in 1 week or worsens return to office.

## 2022-01-09 NOTE — Progress Notes (Signed)
Acute Office Visit  Subjective:     Patient ID: Candice Estrada, female    DOB: 01-05-1976, 46 y.o.   MRN: [id number]  Chief Complaint  Patient presents with   Acute Visit    left side really swollen and tender under ribs    HPI Patient is in today for swelling and tenderness of her left side above her lower rib cage since Christmas when she was moving christmas trees. She denies chest pain, shortness of breath, nausea, vomiting, diarrhea, or constipation, abdominal pain. Tenderness is overall improving. Has not tried any medications.  Ms Melcher is also concerned about recurring nosebleeds. She reports several instances of this, the most recent on Christmas Eve that was the worst and saturated a tissue before it stopped. These nosebleeds are not associated with other symptoms and have improved since she placed a cool air humidifier in her bedroom.  Other concerns discussed today include hot flashes that occur daily throughout the day for the last several months. She is also amenorrheic for 3 months now. She previously requested hormone levels drawn, we discussed that there is no lab diagnostic of menopause, but her [place] level was 113.9.    Review of Systems  All other systems reviewed and are negative.       Objective:    BP 116/80   Pulse 79   Temp 97.7 F (36.5 C) (Oral)   Ht '5\' 3"'$  (1.6 m)   Wt 161 lb (73 kg)   SpO2 95%   BMI 28.52 kg/m    Physical Exam Vitals and nursing note reviewed.  Constitutional:      Appearance: Normal appearance. She is normal weight.  HENT:     Head: Normocephalic and atraumatic.  Chest:     Chest wall: Tenderness present. No mass, deformity or swelling.    Abdominal:     General: There is no distension. There are no signs of injury.     Palpations: Abdomen is soft.  Skin:    General: Skin is warm and dry.  Neurological:     General: No focal deficit present.     Mental Status: She is alert and oriented to person, place, and  time. Mental status is at baseline.  Psychiatric:        Mood and Affect: Mood normal.        Behavior: Behavior normal.        Thought Content: Thought content normal.        Judgment: Judgment normal.     No results found for any visits on 01/09/22.     01/09/2022    8:22 AM 12/31/2021    9:37 AM 07/29/2019   10:46 AM  GAD 7 : Generalized Anxiety Score  Nervous, Anxious, on Edge '1 1 1  '$ Control/stop worrying '1 1 1  '$ Worry too much - different things '1 1 1  '$ Trouble relaxing '1 1 1  '$ Restless '1 1 2  '$ Easily annoyed or irritable '1 1 3  '$ Afraid - awful might happen '1 1 1  '$ Total GAD 7 Score '7 7 10  '$ Anxiety Difficulty Somewhat difficult Somewhat difficult Somewhat difficult        Assessment & Plan:   Problem List Items Addressed This Visit       Cardiovascular and Mediastinum   Hot flashes    Discussed non-hormonal treatment options and likelihood she is perimenopausal. Would like to try natural remedies such as acupuncture or black cohosh at this time. Will consider Paroxetine  if she would like medical management.        Other   Rib pain on left side - Primary    No nodule or mass on palpation, rib cage is symmetrical. Tenderness overlying left lower ribs to palpation, not at rest. No dyspnea, chest pain, palpitations, lightheadedness, diaphoresis, radiating pain, N/V. Chest wall is symmetrical. This appears to be musculoskeletal pain in the setting of heavy lifting of 12 christmas trees. Instructed to try NSAIDs and rest and if pain not improved in 1 week or worsens return to office.      Epistaxis    No acute nosebleed last 3 days since humidifier use and nosebleeds seem to have resolved on their own with minimal bleeding. Discussed continuing humidification of air at home, recent labs were normal, no other signs of bleeding and no blood thinning medications. If this continues or worsens please let me know.       No orders of the defined types were placed in this  encounter.   Return if symptoms worsen or fail to improve.  Rubie Maid, FNP

## 2022-01-09 NOTE — Assessment & Plan Note (Signed)
No acute nosebleed last 3 days since humidifier use and nosebleeds seem to have resolved on their own with minimal bleeding. Discussed continuing humidification of air at home, recent labs were normal, no other signs of bleeding and no blood thinning medications. If this continues or worsens please let me know.

## 2022-01-09 NOTE — Assessment & Plan Note (Addendum)
Discussed non-hormonal treatment options and likelihood she is perimenopausal. Would like to try natural remedies such as acupuncture or black cohosh at this time. Will consider Paroxetine if she would like medical management.

## 2022-01-18 ENCOUNTER — Encounter: Payer: Self-pay | Admitting: Neurology

## 2022-01-18 ENCOUNTER — Ambulatory Visit (INDEPENDENT_AMBULATORY_CARE_PROVIDER_SITE_OTHER): Payer: BC Managed Care – PPO | Admitting: Neurology

## 2022-01-18 VITALS — BP 118/79 | HR 70 | Ht 63.0 in | Wt 162.2 lb

## 2022-01-18 DIAGNOSIS — R519 Headache, unspecified: Secondary | ICD-10-CM | POA: Diagnosis not present

## 2022-01-18 MED ORDER — PREDNISONE 20 MG PO TABS: 60.0000 mg | ORAL_TABLET | Freq: Every day | ORAL | 1 refills | Status: DC

## 2022-01-18 MED ORDER — UBRELVY 100 MG PO TABS: 100.0000 mg | ORAL_TABLET | ORAL | 0 refills | Status: DC | PRN

## 2022-01-18 NOTE — Patient Instructions (Addendum)
Bloodwork Take prednisone daily Candice Estrada daily [phone] give me updates over the weekend    Cluster Headache A cluster headache is a type of primary headache that causes deep, intense head pain. Cluster headaches can last from 15 minutes to 3 hours. They usually occur on one side of the head or face. They may occur on the other side of the head when a new cluster of headaches begins. Cluster headaches occur repeatedly over weeks to months. They may happen several times a day. They often occur at the same time of day, often at night. They may happen more often in the fall and springtime. What are the causes? The cause of this condition is not known. Unlike migraine and tension headache, a cluster headache generally is not associated with triggers, such as foods, hormonal changes, or stress. What increases the risk? The following factors may make you likely to develop this condition: Being a female between the ages of 25-79 years old. Smoking or using products that contain nicotine or tobacco. Having elevated levels of histamine. This can happen in people who have allergies. Taking medicines that cause blood vessels to expand, such as nitroglycerin. Having a parent or sibling who has cluster headaches. What are the signs or symptoms? Symptoms of this condition include: Extremely bad pain on one side of the head that begins behind or around your eye or temple but may radiate to other areas of your face, head, and neck. Nausea. Sensitivity to light. Runny nose and nasal stuffiness. Forehead or facial sweating on the affected side. Droopy or swollen eyelid, eye redness, or tearing on the affected side. Restlessness and agitation. Pale skin (pallor) or flushing on your face. How is this diagnosed? This condition may be diagnosed based on: Your description of the attacks, including your pain, the location and severity of your headaches, and symptoms. Your health care provider may also ask  how often your headaches occur and how long they last. A neurological examination to detect physical signs of a neurological disorder. Your health care provider will test your senses, reflexes, and nerves. The exam is usually normal in people who have cluster headaches. Tests. Your health care provider may order additional tests to see if your headaches are caused by another medical condition. These tests may show that you do not have cluster headaches. Tests may include: MRI. CT scan. Blood tests. How is this treated? This condition may be treated with: Medicines to relieve pain and to prevent repeated attacks. Some people may need a combination of medicines. Oxygen that is breathed in through a mask. This helps to relieve pain of an attack in 15-20 minutes. Follow these instructions at home: Headache diary Keep a headache diary as told by your health care provider. Doing this can help you and your health care provider figure out what triggers your headaches. In your headache diary, include information about: The time of day that your headache started and what you were doing when it began. How long your headache lasted. Where your pain started and whether it moved to other areas. The type of pain, such as burning, stabbing, throbbing, or cramping. Your level of pain. Use a pain scale and rate the pain with a number from 1 (mild) up to 10 (severe). The treatment that you used, and any change in symptoms after treatment. Medicines Take over-the-counter and prescription medicines only as told by your health care provider. Ask your health care provider if the medicine prescribed to you: Requires you to avoid  driving or using heavy machinery. Can cause constipation. You may need to take these actions to prevent or treat constipation: Drink enough fluid to keep your urine pale yellow. Take over-the-counter or prescription medicines. Eat foods that are high in fiber, such as beans, whole grains,  and fresh fruits and vegetables. Limit foods that are high in fat and processed sugars, such as fried or sweet foods. Lifestyle Follow a regular sleep schedule. Do not vary the time that you go to bed or the amount that you sleep from day to day. It is important to stay on the same schedule during a cluster period to help prevent headaches. Get 7-9 hours of sleep each night, or the amount recommended by your health care provider. Limit or manage stress. Consider stress relief options such as acupuncture, counseling, biofeedback, and massage. Talk with your health care provider about which methods might be good for you. Exercise regularly. Exercise for at least 30 minutes, 5 times each week. Moderate exercise may be best. Eat a healthy diet and avoid any specific foods that may trigger your headaches. Do not drink alcohol. Drinking alcohol may quickly trigger a severe headache. Do not use any products that contain nicotine or tobacco, such as cigarettes, e-cigarettes, and chewing tobacco. If you need help quitting, ask your health care provider. General instructions Use oxygen as told by your health care provider. Keep all follow-up visits as told by your health care provider. This is important. Contact a health care provider if: Your headaches change, become more severe, or occur more often. The medicine or oxygen that your health care provider recommended does not help. Get help right away if you: Faint. Have weakness or numbness, especially on one side of your body or face. Have double vision. Have nausea or vomiting that does not go away within several hours. Have trouble talking, walking, or keeping your balance. Have pain or stiffness in your neck and you have a fever. Summary A cluster headache is a type of primary headache that causes deep, intense head pain, usually on one side of the head. Keep a headache diary to help discover what triggers your headaches. Avoiding alcohol and  certain medications may help prevent cluster headaches. There are many treatments for cluster headaches including oxygen, medications to stop a headache, and medications to prevent the headaches. Talk to your doctor about treatment options. This information is not intended to replace advice given to you by your health care provider. Make sure you discuss any questions you have with your health care provider. Document Revised: 06/14/2021 Document Reviewed: 02/04/2019 Elsevier Patient Education  [place]: Please take one tablet at the onset of your headache. If it does not improve the symptoms please take one additional tablet. Do not take more then 2 tablets in 24hrs.   Daily prednisone  Prednisone Tablets What is this medication? PREDNISONE (PRED ni sone) treats many conditions such as asthma, allergic reactions, arthritis, inflammatory bowel diseases, adrenal, and blood or bone marrow disorders. It works by decreasing inflammation, slowing down an overactive immune system, or replacing cortisol normally made in the body. Cortisol is a hormone that plays an important role in how the body responds to stress, illness, and injury. It belongs to a group of medications called steroids. This medicine may be used for other purposes; ask your health care provider or pharmacist if you have questions. COMMON BRAND NAME(S): Deltasone, Predone, Sterapred, Sterapred DS What should I tell my care team before I  take this medication? They need to know if you have any of these conditions: Cushing's syndrome Diabetes Glaucoma Heart disease High blood pressure Infection (especially a virus infection such as chickenpox, cold sores, or herpes) Kidney disease Liver disease Mental illness Myasthenia gravis Osteoporosis Seizures Stomach or intestine problems Thyroid disease An unusual or allergic reaction to lactose, prednisone, other medications, foods, dyes, or preservatives Pregnant  or trying to get pregnant Breast-feeding How should I use this medication? Take this medication by mouth with a glass of water. Follow the directions on the prescription label. Take this medication with food. If you are taking this medication once a day, take it in the morning. Do not take more medication than you are told to take. Do not suddenly stop taking your medication because you may develop a severe reaction. Your care team will tell you how much medication to take. If your care team wants you to stop the medication, the dose may be slowly lowered over time to avoid any side effects. Talk to your care team about the use of this medication in children. Special care may be needed. Overdosage: If you think you have taken too much of this medicine contact a poison control center or emergency room at once. NOTE: This medicine is only for you. Do not share this medicine with others. What if I miss a dose? If you miss a dose, take it as soon as you can. If it is almost time for your next dose, talk to your care team. You may need to miss a dose or take an extra dose. Do not take double or extra doses without advice. What may interact with this medication? Do not take this medication with any of the following: Metyrapone Mifepristone This medication may also interact with the following: Aminoglutethimide Amphotericin B Aspirin and aspirin-like medications Barbiturates Certain medications for diabetes, like glipizide or glyburide Cholestyramine Cholinesterase inhibitors Cyclosporine Digoxin Diuretics Ephedrine Female hormones, like estrogens and birth control pills Isoniazid Ketoconazole NSAIDS, medications for pain and inflammation, like ibuprofen or naproxen Phenytoin Rifampin Toxoids Vaccines Warfarin This list may not describe all possible interactions. Give your health care provider a list of all the medicines, herbs, non-prescription drugs, or dietary supplements you use. Also  tell them if you smoke, drink alcohol, or use illegal drugs. Some items may interact with your medicine. What should I watch for while using this medication? Visit your care team for regular checks on your progress. If you are taking this medication over a prolonged period, carry an identification card with your name and address, the type and dose of your medication, and your care team's name and address. This medication may increase your risk of getting an infection. Tell your care team if you are around anyone with measles or chickenpox, or if you develop sores or blisters that do not heal properly. If you are going to have surgery, tell your care team that you have taken this medication within the last twelve months. Ask your care team about your diet. You may need to lower the amount of salt you eat. This medication may increase blood sugar. Ask your care team if changes in diet or medications are needed if you have diabetes. What side effects may I notice from receiving this medication? Side effects that you should report to your care team as soon as possible: Allergic reactions--skin rash, itching, hives, swelling of the face, lips, tongue, or throat Cushing syndrome--increased fat around the midsection, upper back, neck, or face, pink  or purple stretch marks on the skin, thinning, fragile skin that easily bruises, unexpected hair growth High blood sugar (hyperglycemia)--increased thirst or amount of urine, unusual weakness or fatigue, blurry vision Increase in blood pressure Infection--fever, chills, cough, sore throat, wounds that don't heal, pain or trouble when passing urine, general feeling of discomfort or being unwell Low adrenal gland function--nausea, vomiting, loss of appetite, unusual weakness or fatigue, dizziness Mood and behavior changes--anxiety, nervousness, confusion, hallucinations, irritability, hostility, thoughts of suicide or self-harm, worsening mood, feelings of  depression Stomach bleeding--bloody or black, tar-like stools, vomiting blood or brown material that looks like coffee grounds Swelling of the ankles, hands, or feet Side effects that usually do not require medical attention (report to your care team if they continue or are bothersome): Acne General discomfort and fatigue Headache Increase in appetite Nausea Trouble sleeping Weight gain This list may not describe all possible side effects. Call your doctor for medical advice about side effects. You may report side effects to FDA at 1-800-FDA-1088. Where should I keep my medication? Keep out of the reach of children. Store at room temperature between 15 and 30 degrees C (59 and 86 degrees F). Protect from light. Keep container tightly closed. Throw away any unused medication after the expiration date. NOTE: This sheet is a summary. It may not cover all possible information. If you have questions about this medicine, talk to your doctor, pharmacist, or health care provider.  2023 Elsevier/Gold Standard (2007-02-21 00:00:00) Ubrogepant Tablets What is this medication? UBROGEPANT (ue BROE je pant) treats migraines. It works by blocking a substance in the body that causes migraines. It is not used to prevent migraines. This medicine may be used for other purposes; ask your health care provider or pharmacist if you have questions. COMMON BRAND NAME(S): Candice Estrada What should I tell my care team before I take this medication? They need to know if you have any of these conditions: Kidney disease Liver disease An unusual or allergic reaction to ubrogepant, other medications, foods, dyes, or preservatives Pregnant or trying to get pregnant Breast-feeding How should I use this medication? Take this medication by mouth with a glass of water. Take it as directed on the prescription label. You can take it with or without food. If it upsets your stomach, take it with food. Keep taking it unless your  care team tells you to stop. Talk to your care team about the use of this medication in children. Special care may be needed. Overdosage: If you think you have taken too much of this medicine contact a poison control center or emergency room at once. NOTE: This medicine is only for you. Do not share this medicine with others. What if I miss a dose? This does not apply. This medication is not for regular use. What may interact with this medication? Do not take this medication with any of the following: Adagrasib Ceritinib Certain antibiotics, such as chloramphenicol, clarithromycin, telithromycin Certain antivirals for HIV, such as atazanavir, cobicistat, darunavir, delavirdine, fosamprenavir, indinavir, ritonavir Certain medications for fungal infections, such as itraconazole, ketoconazole, posaconazole, voriconazole Conivaptan Grapefruit Idelalisib Mifepristone Nefazodone Ribociclib This medication may also interact with the following: Carvedilol Certain medications for seizures, such as phenobarbital, phenytoin Ciprofloxacin Cyclosporine Eltrombopag Fluconazole Fluvoxamine Quinidine Rifampin St. John's wort Verapamil This list may not describe all possible interactions. Give your health care provider a list of all the medicines, herbs, non-prescription drugs, or dietary supplements you use. Also tell them if you smoke, drink alcohol, or use illegal  drugs. Some items may interact with your medicine. What should I watch for while using this medication? Visit your care team for regular checks on your progress. Tell your care team if your symptoms do not start to get better or if they get worse. Your mouth may get dry. Chewing sugarless gum or sucking hard candy and drinking plenty of water may help. Contact your care team if the problem does not go away or is severe. What side effects may I notice from receiving this medication? Side effects that you should report to your care team  as soon as possible: Allergic reactions--skin rash, itching, hives, swelling of the face, lips, tongue, or throat Side effects that usually do not require medical attention (report to your care team if they continue or are bothersome): Drowsiness Dry mouth Fatigue Nausea This list may not describe all possible side effects. Call your doctor for medical advice about side effects. You may report side effects to FDA at 1-800-FDA-1088. Where should I keep my medication? Keep out of the reach of children and pets. Store between 15 and 30 degrees C (59 and 86 degrees F). Get rid of any unused medication after the expiration date. To get rid of medications that are no longer needed or have expired: Take the medication to a medication take-back program. Check with your pharmacy or law enforcement to find a location. If you cannot return the medication, check the label or package insert to see if the medication should be thrown out in the garbage or flushed down the toilet. If you are not sure, ask your care team. If it is safe to put it in the trash, pour the medication out of the container. Mix the medication with cat litter, dirt, coffee grounds, or other unwanted substance. Seal the mixture in a bag or container. Put it in the trash. NOTE: This sheet is a summary. It may not cover all possible information. If you have questions about this medicine, talk to your doctor, pharmacist, or health care provider.  2023 Elsevier/Gold Standard (2021-02-21 00:00:00)     Temporal Arteritis  Temporal arteritis is a condition that causes arteries to become inflamed. It usually affects arteries in your head and face, but arteries in any part of the body can become inflamed. The condition is also called giant cell arteritis.  Temporal arteritis can cause serious problems such as blindness. Early treatment can help prevent these problems. What are the causes? The cause of this condition is not known. What  increases the risk? The following factors may make you more likely to develop this condition: Being older than 50. Being a woman. Being Caucasian. Being of [place], [place], [place], [place] or [place] ancestry. Having a family history of the condition. Having a certain condition that causes muscle pain and stiffness (polymyalgia rheumatica, PMR). What are the signs or symptoms? Some people with temporal arteritis have just one symptom, while others have several symptoms. Most symptoms are related to the head and face. These may include: Headache. Hard, swollen, or tender temples. This is common. Your temples are the areas on either side of your forehead. Pain when combing your hair or when laying your head down. Pain in the jaw when chewing. Pain in the throat or tongue. Problems with your vision, such as sudden loss of vision in one eye, or seeing double. Other symptoms may include: Fever. Tiredness (fatigue). A dry cough. Pain in the hips and shoulders. Pain in the arms during exercise. Depression. Weight loss. How  is this diagnosed? This condition may be diagnosed based on: Your symptoms. Your medical history. A physical exam. Tests, including: Blood tests. A test in which a tissue sample is removed from an artery so it can be examined (biopsy). Imaging tests, such as an ultrasound or MRI. How is this treated? This condition may be treated with: A type of medicine to reduce inflammation (corticosteroid). Medicines to weaken your immune system (immunosuppressants). Other medicines to treat vision problems. You will need to see your health care provider while you are being treated. The medicines used to treat this condition can increase your risk of problems such as bone loss and diabetes. During follow-up visits, your health care provider will check for problems by: Doing blood tests and bone density tests. Checking your blood pressure and blood sugar. Follow these  instructions at home: Medicines Take over-the-counter and prescription medicines only as told by your health care provider. Take any vitamins or supplements recommended by your health care provider. These may include vitamin D and calcium, which help keep your bones from becoming weak. Eating and drinking  Eat a heart-healthy diet. This may include: Eating high-fiber foods, such as fresh fruits and vegetables, whole grains, and beans. Eating heart-healthy fats (omega-3 fats), such as fish, flaxseed, and flaxseed oil. Limiting foods that are high in saturated fat and cholesterol, such as processed and fried foods, fatty meat, and full-fat dairy. Limiting how much salt (sodium) you eat. Include calcium and vitamin D in your diet. Good sources of calcium and vitamin D include: Low-fat dairy products such as milk, yogurt, and cheese. Certain fish, such as fresh or canned salmon, tuna, and sardines. Products that have calcium and vitamin D added to them (fortified products), such as fortified cereals or juice. General instructions Exercise. Talk with your health care provider about what exercises are okay for you. Exercises that increase your heart rate (aerobic exercise), such as walking, are often recommended. Aerobic exercise helps control your blood pressure and prevent bone loss. Stay up to date on all vaccines as directed by your health care provider. Keep all follow-up visits as told by your health care provider. This is important. Contact a health care provider if: Your symptoms get worse. You develop signs of infection, such as fever, swelling, redness, warmth, and tenderness. Get help right away if: You lose your vision. Your pain does not go away, even after you take medicine. You have chest pain. You have trouble breathing. One side of your face or body suddenly becomes weak or numb. These symptoms may represent a serious problem that is an emergency. Do not wait to see if the  symptoms will go away. Get medical help right away. Call your local emergency services (911 in the [place] Do not drive yourself to the hospital. Summary Temporal arteritis is a condition that causes arteries to become inflamed. It usually affects arteries in your head and face. This condition can cause serious problems, such as blindness. Treatment can help prevent these problems. Symptoms may include hard or tender temples, pain in your jaw when chewing, problems with your vision, or pain in your hips and shoulders. Take over-the-counter and prescription medicines as told by your health care provider. This information is not intended to replace advice given to you by your health care provider. Make sure you discuss any questions you have with your health care provider. Document Revised: 03/14/2020 Document Reviewed: 03/14/2020 Elsevier Patient Education  [place]. Trigeminal Neuralgia  Trigeminal neuralgia is a nerve  disorder that causes severe pain on one side of the face. The pain may last from a few seconds to several minutes, but it can happen hundreds of times a day. The pain is usually only on one side of the face. Symptoms may occur for days, weeks, or months and then go away for months or years. The pain may return and be worse than before. What are the causes? This condition may be caused by: Damage or pressure to a nerve in the head that is called the trigeminal nerve. An attack can be triggered by: Talking or chewing. Putting on makeup. Washing, shaving, or touching your face. Brushing your teeth. Blasts of hot or cold air. Primary demyelinating disorders, such as multiple sclerosis. Tumors. What increases the risk? You are more likely to develop this condition if: You are 77-108 years old. You are female. What are the signs or symptoms? The main symptom of this condition is severe pain in the jaw, lips, eyes, nose, scalp, forehead, and face. How is this  diagnosed? This condition is diagnosed with a physical exam. A CT scan or an MRI may be done to rule out other conditions that can cause facial pain. How is this treated? This condition may be treated with: Measures to avoid the things that trigger your symptoms. Prescription medicines such as anticonvulsants. Procedures such as ablation, thermal, or radiation therapy. Cognitive or behavioral therapy. Complementary therapies such as: Gentle, regular exercise or yoga. Meditation. Aromatherapy. Acupuncture. Surgery. This may be done in severe cases if other medical treatment does not provide relief. It may take up to one month for treatment to start relieving the pain. Follow these instructions at home: Managing pain  Learn as much as you can about how to manage your pain. Ask your health care provider if a pain specialist would be helpful. Consider talking with a mental health care provider about how to cope with the pain. Consider joining a pain support group. General instructions Take over-the-counter and prescription medicines only as told by your health care provider. Avoid the things that trigger your symptoms. It may help to: Chew on the unaffected side of your mouth. Avoid touching your face. Avoid blasts of hot or cold air. Keep all follow-up visits. Where to find more information [employer] Association: facepain.org Contact a health care provider if: Your medicine is not helping your symptoms. You have side effects from the medicine used for treatment. You develop new, unexplained symptoms, such as: Double vision. Facial weakness or numbness. Changes in hearing or balance. You feel depressed. Get help right away if: Your pain is severe and is not getting better. You develop suicidal thoughts. If you ever feel like you may hurt yourself or others, or have thoughts about taking your own life, get help right away. Go to your nearest emergency department or: Call your  local emergency services (911 in the [place] Call a suicide crisis helpline, such as the [place] at (838)359-9365 or 988 in the [place]. This is open 24 hours a day in the [place] Text the Crisis Text Line at (234)409-9368 (in the [place].). Summary Trigeminal neuralgia is a nerve disorder that causes severe pain on one side of the face. The pain may last from a few seconds to several minutes. This condition is caused by damage or pressure to a nerve in the head that is called the trigeminal nerve. Treatment may include avoiding the things that trigger your symptoms, taking medicines, or having procedures or surgery.  It may take up to one month for treatment to start relieving the pain. Keep all follow-up visits. This information is not intended to replace advice given to you by your health care provider. Make sure you discuss any questions you have with your health care provider. Document Revised: 07/27/2020 Document Reviewed: 06/26/2020 Elsevier Patient Education  [place] Headache A cluster headache is a type of primary headache that causes deep, intense head pain. Cluster headaches can last from 15 minutes to 3 hours. They usually occur on one side of the head or face. They may occur on the other side of the head when a new cluster of headaches begins. Cluster headaches occur repeatedly over weeks to months. They may happen several times a day. They often occur at the same time of day, often at night. They may happen more often in the fall and springtime. What are the causes? The cause of this condition is not known. Unlike migraine and tension headache, a cluster headache generally is not associated with triggers, such as foods, hormonal changes, or stress. What increases the risk? The following factors may make you likely to develop this condition: Being a female between the ages of 91-48 years old. Smoking or using products that contain nicotine or  tobacco. Having elevated levels of histamine. This can happen in people who have allergies. Taking medicines that cause blood vessels to expand, such as nitroglycerin. Having a parent or sibling who has cluster headaches. What are the signs or symptoms? Symptoms of this condition include: Extremely bad pain on one side of the head that begins behind or around your eye or temple but may radiate to other areas of your face, head, and neck. Nausea. Sensitivity to light. Runny nose and nasal stuffiness. Forehead or facial sweating on the affected side. Droopy or swollen eyelid, eye redness, or tearing on the affected side. Restlessness and agitation. Pale skin (pallor) or flushing on your face. How is this diagnosed? This condition may be diagnosed based on: Your description of the attacks, including your pain, the location and severity of your headaches, and symptoms. Your health care provider may also ask how often your headaches occur and how long they last. A neurological examination to detect physical signs of a neurological disorder. Your health care provider will test your senses, reflexes, and nerves. The exam is usually normal in people who have cluster headaches. Tests. Your health care provider may order additional tests to see if your headaches are caused by another medical condition. These tests may show that you do not have cluster headaches. Tests may include: MRI. CT scan. Blood tests. How is this treated? This condition may be treated with: Medicines to relieve pain and to prevent repeated attacks. Some people may need a combination of medicines. Oxygen that is breathed in through a mask. This helps to relieve pain of an attack in 15-20 minutes. Follow these instructions at home: Headache diary Keep a headache diary as told by your health care provider. Doing this can help you and your health care provider figure out what triggers your headaches. In your headache diary,  include information about: The time of day that your headache started and what you were doing when it began. How long your headache lasted. Where your pain started and whether it moved to other areas. The type of pain, such as burning, stabbing, throbbing, or cramping. Your level of pain. Use a pain scale and rate the pain with a number from 1 (mild)  up to 10 (severe). The treatment that you used, and any change in symptoms after treatment. Medicines Take over-the-counter and prescription medicines only as told by your health care provider. Ask your health care provider if the medicine prescribed to you: Requires you to avoid driving or using heavy machinery. Can cause constipation. You may need to take these actions to prevent or treat constipation: Drink enough fluid to keep your urine pale yellow. Take over-the-counter or prescription medicines. Eat foods that are high in fiber, such as beans, whole grains, and fresh fruits and vegetables. Limit foods that are high in fat and processed sugars, such as fried or sweet foods. Lifestyle Follow a regular sleep schedule. Do not vary the time that you go to bed or the amount that you sleep from day to day. It is important to stay on the same schedule during a cluster period to help prevent headaches. Get 7-9 hours of sleep each night, or the amount recommended by your health care provider. Limit or manage stress. Consider stress relief options such as acupuncture, counseling, biofeedback, and massage. Talk with your health care provider about which methods might be good for you. Exercise regularly. Exercise for at least 30 minutes, 5 times each week. Moderate exercise may be best. Eat a healthy diet and avoid any specific foods that may trigger your headaches. Do not drink alcohol. Drinking alcohol may quickly trigger a severe headache. Do not use any products that contain nicotine or tobacco, such as cigarettes, e-cigarettes, and chewing tobacco.  If you need help quitting, ask your health care provider. General instructions Use oxygen as told by your health care provider. Keep all follow-up visits as told by your health care provider. This is important. Contact a health care provider if: Your headaches change, become more severe, or occur more often. The medicine or oxygen that your health care provider recommended does not help. Get help right away if you: Faint. Have weakness or numbness, especially on one side of your body or face. Have double vision. Have nausea or vomiting that does not go away within several hours. Have trouble talking, walking, or keeping your balance. Have pain or stiffness in your neck and you have a fever. Summary A cluster headache is a type of primary headache that causes deep, intense head pain, usually on one side of the head. Keep a headache diary to help discover what triggers your headaches. Avoiding alcohol and certain medications may help prevent cluster headaches. There are many treatments for cluster headaches including oxygen, medications to stop a headache, and medications to prevent the headaches. Talk to your doctor about treatment options. This information is not intended to replace advice given to you by your health care provider. Make sure you discuss any questions you have with your health care provider. Document Revised: 06/14/2021 Document Reviewed: 02/04/2019 Elsevier Patient Education  [place]. Migraine Headache A migraine headache is an intense, throbbing pain on one side or both sides of the head. Migraine headaches may also cause other symptoms, such as nausea, vomiting, and sensitivity to light and noise. A migraine headache can last from 4 hours to 3 days. Talk with your doctor about what things may bring on (trigger) your migraine headaches. What are the causes? The exact cause of this condition is not known. However, a migraine may be caused when nerves in the brain  become irritated and release chemicals that cause inflammation of blood vessels. This inflammation causes pain. This condition may be triggered or  caused by: Drinking alcohol. Smoking. Taking medicines, such as: Medicine used to treat chest pain (nitroglycerin). Birth control pills. Estrogen. Certain blood pressure medicines. Eating or drinking products that contain nitrates, glutamate, aspartame, or tyramine. Aged cheeses, chocolate, or caffeine may also be triggers. Doing physical activity. Other things that may trigger a migraine headache include: Menstruation. Pregnancy. Hunger. Stress. Lack of sleep or too much sleep. Weather changes. Fatigue. What increases the risk? The following factors may make you more likely to experience migraine headaches: Being a certain age. This condition is more common in people who are 57-69 years old. Being female. Having a family history of migraine headaches. Being Caucasian. Having a mental health condition, such as depression or anxiety. Being obese. What are the signs or symptoms? The main symptom of this condition is pulsating or throbbing pain. This pain may: Happen in any area of the head, such as on one side or both sides. Interfere with daily activities. Get worse with physical activity. Get worse with exposure to bright lights or loud noises. Other symptoms may include: Nausea. Vomiting. Dizziness. General sensitivity to bright lights, loud noises, or smells. Before you get a migraine headache, you may get warning signs (an aura). An aura may include: Seeing flashing lights or having blind spots. Seeing bright spots, halos, or zigzag lines. Having tunnel vision or blurred vision. Having numbness or a tingling feeling. Having trouble talking. Having muscle weakness. Some people have symptoms after a migraine headache (postdromal phase), such as: Feeling tired. Difficulty concentrating. How is this diagnosed? A migraine  headache can be diagnosed based on: Your symptoms. A physical exam. Tests, such as: CT scan or an MRI of the head. These imaging tests can help rule out other causes of headaches. Taking fluid from the spine (lumbar puncture) and analyzing it (cerebrospinal fluid analysis, or CSF analysis). How is this treated? This condition may be treated with medicines that: Relieve pain. Relieve nausea. Prevent migraine headaches. Treatment for this condition may also include: Acupuncture. Lifestyle changes like avoiding foods that trigger migraine headaches. Biofeedback. Cognitive behavioral therapy. Follow these instructions at home: Medicines Take over-the-counter and prescription medicines only as told by your health care provider. Ask your health care provider if the medicine prescribed to you: Requires you to avoid driving or using heavy machinery. Can cause constipation. You may need to take these actions to prevent or treat constipation: Drink enough fluid to keep your urine pale yellow. Take over-the-counter or prescription medicines. Eat foods that are high in fiber, such as beans, whole grains, and fresh fruits and vegetables. Limit foods that are high in fat and processed sugars, such as fried or sweet foods. Lifestyle Do not drink alcohol. Do not use any products that contain nicotine or tobacco, such as cigarettes, e-cigarettes, and chewing tobacco. If you need help quitting, ask your health care provider. Get at least 8 hours of sleep every night. Find ways to manage stress, such as meditation, deep breathing, or yoga. General instructions Keep a journal to find out what may trigger your migraine headaches. For example, write down: What you eat and drink. How much sleep you get. Any change to your diet or medicines. If you have a migraine headache: Avoid things that make your symptoms worse, such as bright lights. It may help to lie down in a dark, quiet room. Do not drive or  use heavy machinery. Ask your health care provider what activities are safe for you while you are experiencing symptoms. Keep all follow-up  visits as told by your health care provider. This is important. Contact a health care provider if: You develop symptoms that are different or more severe than your usual migraine headache symptoms. You have more than 15 headache days in one month. Get help right away if: Your migraine headache becomes severe. Your migraine headache lasts longer than 72 hours. You have a fever. You have a stiff neck. You have vision loss. Your muscles feel weak or like you cannot control them. You start to lose your balance often. You have trouble walking. You faint. You have a seizure. Summary A migraine headache is an intense, throbbing pain on one side or both sides of the head. Migraines may also cause other symptoms, such as nausea, vomiting, and sensitivity to light and noise. This condition may be treated with medicines and lifestyle changes. You may also need to avoid certain things that trigger a migraine headache. Keep a journal to find out what may trigger your migraine headaches. Contact your health care provider if you have more than 15 headache days in a month or you develop symptoms that are different or more severe than your usual migraine headache symptoms. This information is not intended to replace advice given to you by your health care provider. Make sure you discuss any questions you have with your health care provider. Document Revised: 06/14/2021 Document Reviewed: 02/12/2018 Elsevier Patient Education  [place].

## 2022-01-18 NOTE — Progress Notes (Signed)
[facility]    Provider:  Dr Jaynee Eagles Requesting Provider: Rubie Maid, FNP Primary Care Provider:  Rubie Maid, FNP  CC:  2nd opinion for ice pick headaches  HPI:  Candice Estrada is a 47 y.o. female here as requested by Rubie Maid, FNP for headaches.  Past medical history anxiety, ASD, TIAs, nonsustained vent tach, sees medical Dr. Burt Knack cardiologist, dizziness, hypertension, Mnire's, POTS, dizziness, dehiscence of right ear. Never had headaches. Started about 2 months ago was getting out of the bathtub and started. 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 Only in the right eye. All of a sudden she had a god-awful pain in her periorbital, like something hit her in her eyes, pulsating/pounding/throbbing, constant lasted all evening, she had light sensitivity, the pain was severe, 7-8/10, just stayed around the eye, no vision changes, no facial droop, no jaw pain, it was the worst pain of her life. She hears a heartbeat in her ear, pulsatile tinnitus, she is having it every day, taking ibuprofen every day, daily headaches, wind blowing hurt it, she has dry eyes, it wakes her up in the night. Father has headaches but had brain cancer. She will get nystagmus when this happens. She has absent right peripheral vision. Ibuprofen helps. The first hint of the of the pain. Wakes her up at night, nose is running on the right side, eye stays red, Not jabbing, not electrical. Pressing on the side of the head helps. Tender right temperal area. She has had fevers.No other focal neurologic deficits, associated symptoms, inciting events or modifiable factors.  Reviewed notes, labs and imaging from outside physicians, which showed :  1. lesion of cornea, right - Ocular surface squamous neoplasia, Right eye - Found on routine exam ( Jan 2019) S/p OSSN removal OD nasally 11/10/17 Now with mild residual scarring   From a review of records, medications tried that can be used and headaches  include: Asa, wellbutrin, aspirin, flexeril, plavix, lexapro,ibuprofen, ketorolac, meclizine, metoprolol, zofran, prednisone, propranolol, topamax, venlafaxine  01/02/2022: tsh normal, cmp slightly elevated creatinine 1.08 otherwise unremarkable, cbc unremarkable.   MRI brain 01/08/2022: IMPRESSION: Unremarkable MRI appearance of the brain. No evidence of acute intracranial abnormality.  Review of Systems: Patient complains of symptoms per HPI as well as the following symptoms headache. Pertinent negatives and positives per HPI. All others negative.   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  Other Topics Concern   Not on file  Social History Narrative   Patient lives at home with her family.   Caffeine Use: rarely   Social Determinants of Health   Financial Resource Strain: Low Risk  (07/29/2019)   Overall Financial Resource Strain (CARDIA)    Difficulty of Paying Living Expenses: Not very hard  Food Insecurity: No Food Insecurity (07/29/2019)   Hunger  Vital Sign    Worried About Charity fundraiser in the Last Year: Never true    Ran Out of Food in the Last Year: Never true  Transportation Needs: No Transportation Needs (07/29/2019)   PRAPARE - Hydrologist (Medical): No    Lack of Transportation (Non-Medical): No  Physical Activity: Sufficiently Active (07/29/2019)   Exercise Vital Sign    Days  of Exercise per Week: 7 days    Minutes of Exercise per Session: 40 min  Stress: Stress Concern Present (07/29/2019)   [place]    Feeling of Stress : To some extent  Social Connections: Socially Integrated (07/29/2019)   Social Connection and Isolation Panel [NHANES]    Frequency of Communication with Friends and Family: More than three times a week    Frequency of Social Gatherings with Friends and Family: Once a week    Attends Religious Services: 1 to 4 times per year    Active Member of [employer] or Organizations: No    Attends Music therapist: More than 4 times per year    Marital Status: Married  Human resources officer Violence: Not At Risk (07/29/2019)   Humiliation, Afraid, Rape, and Kick questionnaire    Fear of Current or Ex-Partner: No    Emotionally Abused: No    Physically Abused: No    Sexually Abused: No    Family History  Problem Relation Age of Onset   Liver disease Mother 10       dead   Cancer Mother        breast   Cancer Father    Hypertension Father 31   Stroke Father    Heart attack Father 63   Brain cancer Father    Heart disease Father    Diabetes Father    Hypertension Brother 26   Cancer Paternal Aunt        breast    Dementia Maternal Grandmother    Cancer Maternal Grandmother        thyroid   Cancer Paternal Grandmother        breast   Kidney failure Daughter    Cancer Other        mom's grandma-breast    Past Medical History:  Diagnosis Date   Anxiety    ASD (atrial septal defect), ostium secundum    Status post closure 08/20/10 with Dr. Burt Knack; procedure complicated by right groin hematoma and acute blood loss anemia   Atypical squamous cell changes of undetermined significance (ASCUS) on cervical cytology with negative high risk human papilloma virus (HPV) test result 08/04/2019   07/2019 repeat pap in 3 years per ASCCP guidelines 5 year CIN 3+ risk 0.27%   BV  (bacterial vaginosis) 07/03/5091   Complication of anesthesia    VASO-VAGAL RESPONSE IN RECOVERY ROOM AFTER HEART SURGERY   Hematuria    History of psoriasis    scalp   History of TIAs    Hx of Coronary Ca Score 03/2019   CT 03/2019: Cor Calcium Score 0   Hx of dizziness    random episodes "because my BP is so low"   Hypotension    IUD (intrauterine device) in place 12/16/2012   Kidney stone    Meniere's disease    Night terrors    NSVT (nonsustained ventricular tachycardia) ([place])    a. 4 beat run on Zio 2015.   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    Thyroid enlarged 12/16/2012   History of cyst will get [place]    Patient Active Problem List   Diagnosis Date Noted   Rib pain on left side 01/09/2022   Amenorrhea 12/31/2021   Mass of lower outer quadrant of right breast 05/24/2020   NSVT (nonsustained ventricular tachycardia) ([place]) 05/17/2020   Autonomic dysfunction - insufficency 05/17/2020   Sebaceous cyst of labia 05/03/2020   Atypical squamous cell changes of undetermined significance (ASCUS) on cervical cytology with negative high risk human papilloma virus (HPV) test result 08/04/2019   Hot flashes 08/10/2018   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[place]) 07/24/2018   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   History of thyroid nodule 10/21/2016   Ovarian cyst 01/26/2014   Anxiety 01/19/2014   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   IUD (intrauterine device) in place 12/16/2012   Thyroid enlarged 12/16/2012   POTS (postural orthostatic tachycardia syndrome) 09/15/2010   TIA (transient ischemic attack) 09/15/2010   Atrial septal defect, secundum 08/10/2010    Past Surgical History:  Procedure Laterality Date   ANKLE ARTHROSCOPY WITH RECONSTRUCTION Left 02/19/2018   Procedure: LEFT ANKLE ARTHROSCOPIC DEBRIDEMENT,  LATERAL LIGAMENT RECONSTRUCTION,  OSTEOCHONDRAL TREATMENT;  Surgeon: Erle Crocker, MD;  Location: [facility];  Service: Orthopedics;  Laterality: Left;   ASD REPAIR  08/20/2010   CARDIAC SURGERY     CYSTOSCOPY WITH RETROGRADE PYELOGRAM, URETEROSCOPY AND STENT PLACEMENT Left 08/31/2014   Procedure: CYSTOSCOPY WITH BILATERAL RETROGRADE PYELOGRAM, URETEROSCOPY AND STENT PLACEMENT, STONE EXTRACTION WITH BASKET;  Surgeon: Cleon Gustin, MD;  Location: [facility];  Service: Urology;  Laterality: Left;   FOOT NEUROMA SURGERY  2009   left   kidney stone removed     THYROID CYST EXCISION     TONSILLECTOMY     vascular necrosis left foot      Current Outpatient Medications  Medication Sig Dispense Refill   buPROPion (WELLBUTRIN SR) 150 MG 12 hr tablet Take 1 tablet (150 mg total) by mouth daily. 90 tablet 3   cyclobenzaprine (FLEXERIL) 5 MG tablet Take 1 tablet (5 mg total) by mouth at bedtime. 30 tablet 0   ibuprofen (ADVIL) 800 MG tablet TAKE 1 TABLET EVERY 8 HOURS AS NEEDED 90 tablet 11   PARAGARD INTRAUTERINE COPPER IU by Intrauterine route.     predniSONE (DELTASONE) 20 MG tablet Take 3 tablets (60 mg total) by mouth daily. In the morning with food. 15 tablet 1   Ubrogepant (UBRELVY) 100 MG TABS Take 100 mg by mouth every 2 (two) hours as needed. Maximum '200mg'$  a day. 6 tablet 0   No current facility-administered medications for this visit.    Allergies as of 01/18/2022   (No Known Allergies)    Vitals: BP 118/79   Pulse 70   Ht '5\' 3"'$  (1.6 m)   Wt 162 lb 3.2 oz (73.6 kg)   BMI 28.73 kg/m  Last Weight:  Wt Readings from Last 1 Encounters:  01/18/22 162 lb 3.2 oz (73.6 kg)   Last Height:   Ht Readings from Last 1 Encounters:  01/18/22 '5\' 3"'$  (1.6 m)     Physical exam: Exam: Gen: NAD, conversant, well nourised, well groomed                     CV: RRR, no  MRG. No Carotid Bruits. No peripheral edema, warm, nontender Eyes: Conjunctivae clear without exudates  or hemorrhage  Neuro: Detailed Neurologic Exam  Speech:    Speech is normal; fluent and spontaneous with normal comprehension.  Cognition:    The patient is oriented to person, place, and time;     recent and remote memory intact;     language fluent;     normal attention, concentration,     fund of knowledge Cranial Nerves:    The pupils are equal, round, and reactive to light. Fundi without ONH edema. decreased right peripheral vision otherwise visual fields are full to finger confrontation. Extraocular movements are intact. Trigeminal sensation is intact and the muscles of mastication are normal. The face is symmetric. The palate elevates in the midline. Hearing intact. Voice is normal. Shoulder shrug is normal. The tongue has normal motion without fasciculations.   Coordination: nml  Gait: nml  Motor Observation:    No asymmetry, no atrophy, and no involuntary movements noted. Tone:    Normal muscle tone.    Posture:    Posture is normal. normal erect    Strength:    Strength is V/V in the upper and lower limbs.      Sensation: intact to LT     Reflex Exam:  DTR's:    Deep tendon reflexes in the upper and lower extremities are normal bilaterally.   Toes:    The toes are downgoing bilaterally.   Clonus:    Clonus is absent.    Assessment/Plan:  47 y.o. female here as requested by Rubie Maid, FNP for headaches.  Past medical history anxiety, ASD, TIAs, nonsustained vent tach, sees medical Dr. Burt Knack cardiologist, dizziness, hypertension, Mnire's, POTS, dizziness, dehiscence of right ear. Never had headaches. Started about 2 months ago was getting out of the bathtub and started. She had cancer in her right eye( lesion of cornea, right, Ocular surface squamous neoplasia, Right eye, Found on routine exam ( Jan 2019),S/p OSSN, removal OD nasally 11/10/17, Now with mild residual scarring .  This is a patient with a very unusual presentation, never had migraines,  does not have a family history of migraines or headaches that 1 day had acute onset severe right periorbital pain.  It does have migrainous qualities of pulsating pounding throbbing, light sensitivity, without vision changes facial droop jaw pain but was the worst pain of her life.  The pain continues daily continuous and wakes her up at night.  She has had right eye cancer (see above), she also has some cluster qualities such as it wakes her up in the middle the night, nose runs on the right side and eye stays red.  She also has some qualities of GCA tender right temporal area possibly some fevers.  Very unclear etiology and wide differential.  At this time the very first thing we should rule out is GCA I will order ESR CRP and start her on steroids and give her Roselyn Meier as needed.  She had an MRI which was unremarkable.       Only in the right eye. All of a sudden she had a god-awful pain in her periorbital, like something hit her in her eyes, pulsating/pounding/throbbing, constant lasted all evening, she had light sensitivity, the pain was severe, 7-8/10, just stayed around the eye, no vision changes, no facial droop, no jaw pain, it was the worst pain of her life. She hears a heartbeat in her ear, pulsatile tinnitus, she is having it  every day, taking ibuprofen every day, daily headaches, wind blowing hurt it, she has dry eyes, it wakes her up in the night. Father has headaches but had brain cancer. She will get nystagmus when this happens. She has absent right peripheral vision. Ibuprofen helps. The first hint of the of the pain. Wakes her up at night, nose is running on the right side, eye stays red, Not jabbing, not electrical. Pressing on the side of the head helps. Tender right temperal area. She has had fevers.No other focal neurologic deficits, associated symptoms, inciting events or modifiable factors.  Bloodwork Take prednisone daily Roselyn Meier daily [phone] give me updates over the  weekend If blood works negative, will see how prednisone works, we could consider migraines, we could consider additional imaging I also recommended she contact [facility] at [facility].  Orders Placed This Encounter  Procedures   Sedimentation rate   C-reactive protein   C-reactive protein   Sedimentation rate   Meds ordered this encounter  Medications   predniSONE (DELTASONE) 20 MG tablet    Sig: Take 3 tablets (60 mg total) by mouth daily. In the morning with food.    Dispense:  15 tablet    Refill:  1   Ubrogepant (UBRELVY) 100 MG TABS    Sig: Take 100 mg by mouth every 2 (two) hours as needed. Maximum '200mg'$  a day.    Dispense:  6 tablet    Refill:  0    Cc: Rubie Maid, [place],  Rubie Maid, FNP  Sarina Ill, MD  [facility] [address] [place], [place] [address]  Phone [phone] Fax [phone]  I spent over 60 minutes of face-to-face and non-face-to-face time with patient on the  1. Temporal pain    diagnosis.  This included previsit chart review, lab review, study review, order entry, electronic health record documentation, patient education on the different diagnostic and therapeutic options, counseling and coordination of care, risks and benefits of management, compliance, or risk factor reduction

## 2022-01-19 ENCOUNTER — Encounter: Payer: Self-pay | Admitting: Neurology

## 2022-01-19 LAB — SEDIMENTATION RATE: Sed Rate: 2 mm/hr (ref 0–32)

## 2022-01-19 LAB — C-REACTIVE PROTEIN: CRP: <1 mg/L (ref 0–10)

## 2022-01-21 ENCOUNTER — Encounter: Payer: Self-pay | Admitting: Neurology

## 2022-02-02 DIAGNOSIS — H9201 Otalgia, right ear: Secondary | ICD-10-CM | POA: Diagnosis not present

## 2022-02-04 ENCOUNTER — Telehealth: Payer: Self-pay | Admitting: Neurology

## 2022-02-04 NOTE — Telephone Encounter (Signed)
Pt is asking for a response to her my chart messages from the 17th and the 21st.  Pt advised that as soon as Dr Jaynee Eagles is available to do so she responds to her messages, pt states she will wait on a response.

## 2022-02-06 DIAGNOSIS — D3131 Benign neoplasm of right choroid: Secondary | ICD-10-CM | POA: Diagnosis not present

## 2022-02-10 ENCOUNTER — Other Ambulatory Visit: Payer: Self-pay | Admitting: Neurology

## 2022-02-10 DIAGNOSIS — G43709 Chronic migraine without aura, not intractable, without status migrainosus: Secondary | ICD-10-CM

## 2022-02-10 MED ORDER — AJOVY 225 MG/1.5ML ~~LOC~~ SOAJ: 225.0000 mg | SUBCUTANEOUS | 11 refills | Status: DC

## 2022-02-11 ENCOUNTER — Telehealth: Payer: Self-pay | Admitting: Neurology

## 2022-02-11 NOTE — Telephone Encounter (Signed)
Can you call and ensure she has f/u with NO in 4-6 months, megan preferred thanks

## 2022-02-11 NOTE — Telephone Encounter (Signed)
Sent mychart msg informing pt of follow up made with Megan 

## 2022-02-26 ENCOUNTER — Ambulatory Visit: Payer: BC Managed Care – PPO | Admitting: Podiatry

## 2022-03-20 ENCOUNTER — Ambulatory Visit: Payer: Self-pay | Admitting: Podiatry

## 2022-04-22 DIAGNOSIS — H9209 Otalgia, unspecified ear: Secondary | ICD-10-CM | POA: Diagnosis not present

## 2022-04-29 ENCOUNTER — Telehealth: Payer: Self-pay

## 2022-04-29 NOTE — Telephone Encounter (Signed)
Received a clearance from [facility] with no information about what patient needs to be cleared for. I spoke with Chasity from the dental and she states that she will have the office resend another form with all the detailed information.

## 2022-04-29 NOTE — Telephone Encounter (Signed)
Returned the call to [employer], spoke with Chasity.  She has been made aware that we cannot accept a "blank" clearance, than once the procedure has been scheduled, then they can send fax over with that information on it and it will be processed.  She was thankful for the information and states once pt is seen and procedure scheduled, she will send fax back over.

## 2022-04-29 NOTE — Telephone Encounter (Signed)
Chastity called back to talk to pro op about the clearance sent.   She stated this is black because they need [place] to complete this assessment.  They need [place] to complete the form.  Best number  [phone]

## 2022-05-01 ENCOUNTER — Telehealth: Payer: Self-pay

## 2022-05-01 NOTE — Telephone Encounter (Signed)
Rec' dental Form [facility] requesting pt's physical exam. Fax pt's chart notes to   Chasity at [facility] 05/01/22 at 12:54pm to [phone]

## 2022-05-20 NOTE — Telephone Encounter (Signed)
   Name: Candice Estrada  DOB: Jun 03, 1975  MRN: [id number]  Primary Cardiologist: Tonny Bollman, MD  Chart reviewed as part of pre-operative protocol coverage. Because of Carmaleta B Gratz's past medical history and time since last visit, she will require a follow-up in-office visit in order to better assess preoperative cardiovascular risk.   She is scheduled to see Dr. Graciela Husbands on 05/24/2022. I have updated appointment notes to reflect pre-op evaluation.   Pre-op covering staff:  - Please contact requesting surgeon's office via preferred method (i.e, phone, fax) to inform them of need for appointment prior to surgery.   Carlos Levering, NP  05/20/2022, 3:11 PM

## 2022-05-20 NOTE — Telephone Encounter (Signed)
I s/w the DDS office and was able to confirm procedure to be done.     Pre-operative Risk Assessment    Patient Name: Candice Estrada  DOB: 02-14-1975 MRN: [id number]     Request for Surgical Clearance    Procedure:   PROCEDURE HAS BEEN CONFIRMED WITH [facility] OFFICE TODAY 05/20/22; PT WILL BE HAVING 4 TEETH SURGICALLY REMOVED, 2 DENTAL IMPLANTS, 1 FILLINS AND 2 CROWNS (1 CROWN WILL HAVE A ROOT CANAL WITH IT).  Date of Surgery:  Clearance TBD                                 Surgeon:  DR. Carmelia Bake, DDS Surgeon's Group or Practice Name:  [facility] Phone number:  [phone] Fax number:  [phone]   Type of Clearance Requested:   - Medical ; NO MEDICATIONS LISTED AS NEEDING TO BE HELD   Type of Anesthesia:  General    Additional requests/questions:    Elpidio Anis   05/20/2022, 2:40 PM

## 2022-05-21 ENCOUNTER — Other Ambulatory Visit (HOSPITAL_COMMUNITY)
Admission: RE | Admit: 2022-05-21 | Discharge: 2022-05-21 | Disposition: A | Discharge status: Home / Self Care | Payer: BC Managed Care – PPO | Source: Ambulatory Visit | Attending: Adult Health | Admitting: Adult Health

## 2022-05-21 ENCOUNTER — Ambulatory Visit (INDEPENDENT_AMBULATORY_CARE_PROVIDER_SITE_OTHER): Payer: BC Managed Care – PPO | Admitting: Adult Health

## 2022-05-21 ENCOUNTER — Encounter: Payer: Self-pay | Admitting: Adult Health

## 2022-05-21 VITALS — BP 107/73 | HR 94 | Ht 63.0 in | Wt 158.0 lb

## 2022-05-21 DIAGNOSIS — Z975 Presence of (intrauterine) contraceptive device: Secondary | ICD-10-CM | POA: Diagnosis not present

## 2022-05-21 DIAGNOSIS — R232 Flushing: Secondary | ICD-10-CM

## 2022-05-21 DIAGNOSIS — Z1211 Encounter for screening for malignant neoplasm of colon: Secondary | ICD-10-CM

## 2022-05-21 DIAGNOSIS — Z01419 Encounter for gynecological examination (general) (routine) without abnormal findings: Secondary | ICD-10-CM | POA: Insufficient documentation

## 2022-05-21 DIAGNOSIS — N3946 Mixed incontinence: Secondary | ICD-10-CM | POA: Insufficient documentation

## 2022-05-21 DIAGNOSIS — F32A Depression, unspecified: Secondary | ICD-10-CM | POA: Insufficient documentation

## 2022-05-21 DIAGNOSIS — R519 Headache, unspecified: Secondary | ICD-10-CM

## 2022-05-21 DIAGNOSIS — F419 Anxiety disorder, unspecified: Secondary | ICD-10-CM | POA: Insufficient documentation

## 2022-05-21 LAB — HEMOCCULT GUIAC POC 1CARD (OFFICE): Fecal Occult Blood, POC: NEGATIVE

## 2022-05-21 MED ORDER — BUPROPION HCL ER (SR) 150 MG PO TB12: 150.0000 mg | ORAL_TABLET | Freq: Every day | ORAL | 3 refills | Status: DC

## 2022-05-21 MED ORDER — VEOZAH 45 MG PO TABS: 1.0000 | ORAL_TABLET | Freq: Every day | ORAL | 6 refills | Status: DC

## 2022-05-21 MED ORDER — IBUPROFEN 800 MG PO TABS: 800.0000 mg | ORAL_TABLET | Freq: Three times a day (TID) | ORAL | 11 refills | Status: DC | PRN

## 2022-05-21 NOTE — Progress Notes (Signed)
Patient ID: Candice Estrada, female   DOB: [birth year], 47 y.o.   MRN: [id number] History of Present Illness: Candice Estrada is a 47 year old white female, married, [id number], in for a well woman gyn exam and pap. She is having hot flashes and not sleeping well. And having more frequent headaches. Having UI.  PCP is Candice Bushman FNP   Current Medications, Allergies, Past Medical History, Past Surgical History, Family History and Social History were reviewed in [name] record.     Review of Systems: Patient denies any  hearing loss, fatigue, blurred vision, shortness of breath, chest pain, abdominal pain, problems with bowel movements, or intercourse. No joint pain or mood swings.  See HPI for positives.    Physical Exam:BP 107/73 (BP Location: Left Arm, Patient Position: Sitting, Cuff Size: Normal)   Pulse 94   Ht 5\' 3"  (1.6 m)   Wt 158 lb (71.7 kg)   BMI 27.99 kg/m   General:  Well developed, well nourished, no acute distress Skin:  Warm and dry Neck:  Midline trachea, normal thyroid, good ROM, no lymphadenopathy Lungs; Clear to auscultation bilaterally Breast:  No dominant palpable mass, retraction, or nipple discharge Cardiovascular: Regular rate and rhythm Abdomen:  Soft, non tender, no hepatosplenomegaly Pelvic:  External genitalia is normal in appearance, no lesions.  The vagina is normal in appearance. Urethra has no lesions or masses. The cervix is bulbous, +IUD strings at os, pap with HR HPV genotyping performed.  Uterus is felt to be normal size, shape, and contour.  No adnexal masses or tenderness noted.Bladder is non tender, no masses felt. Rectal: Good sphincter tone, no polyps, or hemorrhoids felt.  Hemoccult negative. Extremities/musculoskeletal:  No swelling or varicosities noted, no clubbing or cyanosis Psych:  No mood changes, alert and cooperative,seems happy Fall risk is low    05/21/2022    4:00 PM 01/09/2022    8:21 AM 12/31/2021    9:34 AM   Depression screen PHQ 2/9  Decreased Interest 2 0 1  Down, Depressed, Hopeless 0 0 0  PHQ - 2 Score 2 0 1  Altered sleeping 3 2 1   Tired, decreased energy 3 2 1   Change in appetite 0 2 1  Feeling bad or failure about yourself  0 0 0  Trouble concentrating 3 0 0  Moving slowly or fidgety/restless 0 0 0  Suicidal thoughts 0 0 0  PHQ-9 Score 11 6 4   Difficult doing work/chores  Somewhat difficult Somewhat difficult       05/21/2022    4:00 PM 01/09/2022    8:22 AM 12/31/2021    9:37 AM 07/29/2019   10:46 AM  GAD 7 : Generalized Anxiety Score  Nervous, Anxious, on Edge 3 1 1 1   Control/stop worrying 3 1 1 1   Worry too much - different things 3 1 1 1   Trouble relaxing 3 1 1 1   Restless 2 1 1 2   Easily annoyed or irritable 3 1 1 3   Afraid - awful might happen 1 1 1 1   Total GAD 7 Score 18 7 7 10   Anxiety Difficulty  Somewhat difficult Somewhat difficult Somewhat difficult    Upstream - 05/21/22 1605       Pregnancy Intention Screening   Does the patient want to become pregnant in the next year? No    Does the patient's partner want to become pregnant in the next year? No    Would the patient like to discuss contraceptive options today?  No      Contraception Wrap Up   Current Method IUD or IUS    End Method IUD or IUS              Examination chaperoned by Malachy Mood LPN   Impression and Plan: 1. Encounter for gynecological examination with Papanicolaou smear of cervix Pap sent  Pap in 3 years if normal Physical in 1 year Labs with PCP Mammogram was negative 09/24/21 Colonoscopy per GI  - Cytology - PAP( [place])  2. Encounter for screening fecal occult blood testing Hemoccult was negaitve  - POCT occult blood stool  3. IUD (intrauterine device) in place Paragard placed 03/30/20  4. Hot flashes Having hot flashes not sleeping well Had FSH 113.9 Will try Veozah Normal LFTs in December 2023 Meds ordered this encounter  Medications   Fezolinetant  (VEOZAH) 45 MG TABS    Sig: Take 1 tablet (45 mg total) by mouth daily.    Dispense:  30 tablet    Refill:  6    Order Specific Question:   Supervising Provider    Answer:   Duane Lope H [[id number]]   buPROPion (WELLBUTRIN SR) 150 MG 12 hr tablet    Sig: Take 1 tablet (150 mg total) by mouth daily.    Dispense:  90 tablet    Refill:  3    Order Specific Question:   Supervising Provider    Answer:   Duane Lope H [[id number]]   ibuprofen (ADVIL) 800 MG tablet    Sig: Take 1 tablet (800 mg total) by mouth every 8 (eight) hours as needed.    Dispense:  90 tablet    Refill:  11    Order Specific Question:   Supervising Provider    Answer:   Duane Lope H [[id number]]   Follow up in 3 months   5. 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 Frequent headaches Refilled ibuprofen

## 2022-05-24 ENCOUNTER — Ambulatory Visit: Payer: BC Managed Care – PPO | Attending: Internal Medicine | Admitting: Internal Medicine

## 2022-05-24 DIAGNOSIS — G909 Disorder of the autonomic nervous system, unspecified: Secondary | ICD-10-CM

## 2022-05-24 DIAGNOSIS — I4729 Other ventricular tachycardia: Secondary | ICD-10-CM

## 2022-05-24 DIAGNOSIS — R42 Dizziness and giddiness: Secondary | ICD-10-CM

## 2022-05-27 ENCOUNTER — Encounter: Payer: Self-pay | Admitting: Internal Medicine

## 2022-05-27 LAB — CYTOLOGY - PAP
Comment: NEGATIVE
Diagnosis: NEGATIVE
Diagnosis: REACTIVE
High risk HPV: NEGATIVE

## 2022-05-28 ENCOUNTER — Encounter: Payer: Self-pay | Admitting: *Deleted

## 2022-06-25 ENCOUNTER — Other Ambulatory Visit (HOSPITAL_COMMUNITY): Payer: Self-pay

## 2022-06-25 ENCOUNTER — Telehealth: Payer: Self-pay | Admitting: *Deleted

## 2022-06-25 NOTE — Progress Notes (Signed)
History of Present Illness: [name] is a 47 y.o. female who presents today as a new patient / to re-establish care at [facility] [place]. All available relevant medical records have been reviewed.  - GU /GYN History: 1. Kidney stones. - Had previous left ureteroscopic stone manipulation by Dr. Ronne Estrada on 08/31/2014.  Relevant imaging results: 04/14/2020: CT abdomen/pelvis w/ contrast showed: "Bilateral kidneys enhance symmetrically. Subcentimeter hypodensities. No hydronephrosis. No hydroureter. The urinary bladder is unremarkable. On delayed imaging, there is no urothelial wall thickening and there are no filling defects in the opacified portions of the bilateral collecting systems or ureters."  Today She reports chief complaint of mixed urinary incontinence. She reports urge incontinence. She reports stress incontinence with cough/laugh/sneeze.  She reports the SUI is predominant.  She leaks multiple times per day. Wears 2-3 pads per day. She reports urinary incontinence has been occurring for several years and is significantly bothersome.  Previously did pelvic floor physical therapy and felt that was somewhat helpful but discontinued due to time restrictions.   She reports urinary urgency; she waits for the urge to void to initiate heading for the bathroom. Reports nocturia 2x/night. Denies daytime urinary frequency; voids 3-4x/day. She denies significant caffeine intake.  She denies dysuria, gross hematuria, straining to void, or sensations of incomplete emptying.  Denies recent known stone passage but does report that she had an episode of RLQ pain radiating to right low back and wonders if it may have been a stone passing.    Fall Screening: Do you usually have a device to assist in your mobility? No   Medications: Current Outpatient Medications  Medication Sig Dispense Refill   buPROPion (WELLBUTRIN SR) 150 MG 12 hr tablet Take 1 tablet (150 mg total) by  mouth daily. 90 tablet 3   cyclobenzaprine (FLEXERIL) 5 MG tablet Take 1 tablet (5 mg total) by mouth at bedtime. 30 tablet 0   ibuprofen (ADVIL) 800 MG tablet Take 1 tablet (800 mg total) by mouth every 8 (eight) hours as needed. 90 tablet 11   MELATONIN PO Take by mouth.     Multiple Vitamin (MULTIVITAMIN) tablet Take 1 tablet by mouth daily.     OMEPRAZOLE PO Take by mouth.     PARAGARD INTRAUTERINE COPPER IU by Intrauterine route.     No current facility-administered medications for this visit.    Allergies: No Known Allergies  Past Medical History:  Diagnosis Date   Anxiety    ASD (atrial septal defect), ostium secundum    Status post closure 08/20/10 with Dr. Excell Estrada; procedure complicated by right groin hematoma and acute blood loss anemia   Atypical squamous cell changes of undetermined significance (ASCUS) on cervical cytology with negative high risk human papilloma virus (HPV) test result 08/04/2019   07/2019 repeat pap in 3 years per ASCCP guidelines 5 year CIN 3+ risk 0.27%   BV (bacterial vaginosis) 10/06/2014   Complication of anesthesia    VASO-VAGAL RESPONSE IN RECOVERY ROOM AFTER HEART SURGERY   Hematuria    History of psoriasis    scalp   History of TIAs    Hx of Coronary Ca Score 03/2019   CT 03/2019: Cor Calcium Score 0   Hx of dizziness    random episodes "because my BP is so low"   Hypotension    IUD (intrauterine device) in place 12/16/2012   Kidney stone    Meniere's disease    Night terrors    NSVT (nonsustained ventricular tachycardia) (  HCC)    a. 4 beat run on Zio 2015.   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    Thyroid enlarged 12/16/2012   History of cyst will get [place]   Past Surgical History:  Procedure Laterality Date   ANKLE ARTHROSCOPY WITH RECONSTRUCTION Left 02/19/2018   Procedure: LEFT ANKLE ARTHROSCOPIC  DEBRIDEMENT, LATERAL LIGAMENT RECONSTRUCTION,  OSTEOCHONDRAL TREATMENT;  Surgeon: Candice Hart, MD;  Location: [facility];  Service: Orthopedics;  Laterality: Left;   ASD REPAIR  08/20/2010   CARDIAC SURGERY     CYSTOSCOPY WITH RETROGRADE PYELOGRAM, URETEROSCOPY AND STENT PLACEMENT Left 08/31/2014   Procedure: CYSTOSCOPY WITH BILATERAL RETROGRADE PYELOGRAM, URETEROSCOPY AND STENT PLACEMENT, STONE EXTRACTION WITH BASKET;  Surgeon: Candice Gauze, MD;  Location: [facility];  Service: Urology;  Laterality: Left;   FOOT NEUROMA SURGERY  2009   left   kidney stone removed     THYROID CYST EXCISION     TONSILLECTOMY     vascular necrosis left foot     Family History  Problem Relation Age of Onset   Liver disease Mother 78       dead   Cancer Mother        breast   Cancer Father    Hypertension Father 21   Stroke Father    Heart attack Father 49   Brain cancer Father    Heart disease Father    Diabetes Father    Hypertension Brother 23   Cancer Paternal Aunt        breast    Dementia Maternal Grandmother    Cancer Maternal Grandmother        thyroid   Cancer Paternal Grandmother        breast   Kidney failure Daughter    Cancer Other        mom's grandma-breast   Social History   Socioeconomic History   Marital status: Married    Spouse name: Not on file   Number of children: 2   Years of education: College   Highest education level: Not on file  Occupational History   Occupation: LPN    Employer: [employer]    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  Other Topics Concern   Not on file  Social History Narrative   Patient lives at home with her family.   Caffeine Use: rarely   Social Determinants of Health   Financial  Resource Strain: Low Risk  (05/21/2022)   Overall Financial Resource Strain (CARDIA)    Difficulty of Paying Living Expenses: Not very hard  Food Insecurity: No Food Insecurity (05/21/2022)   Hunger Vital Sign    Worried About Running Out of Food in the Last Year: Never true    Ran Out of Food in the Last Year: Never true  Transportation Needs: No Transportation Needs (05/21/2022)   PRAPARE - Administrator, Civil Service (Medical): No    Lack of Transportation (Non-Medical): No  Physical Activity: Insufficiently Active (05/21/2022)   Exercise Vital Sign    Days of  Exercise per Week: 3 days    Minutes of Exercise per Session: 30 min  Stress: Stress Concern Present (05/21/2022)   [employer] of Occupational Health - Occupational Stress Questionnaire    Feeling of Stress : Very much  Social Connections: Socially Integrated (05/21/2022)   Social Connection and Isolation Panel [NHANES]    Frequency of Communication with Friends and Family: More than three times a week    Frequency of Social Gatherings with Friends and Family: Three times a week    Attends Religious Services: More than 4 times per year    Active Member of Clubs or Organizations: Yes    Attends Banker Meetings: More than 4 times per year    Marital Status: Married  Catering manager Violence: Not At Risk (05/21/2022)   Humiliation, Afraid, Rape, and Kick questionnaire    Fear of Current or Ex-Partner: No    Emotionally Abused: No    Physically Abused: No    Sexually Abused: No    SUBJECTIVE  Review of Systems Constitutional: Patient denies any unintentional weight loss or change in strength lntegumentary: Patient denies any rashes or pruritus Eyes: Patient denies dry eyes ENT: Patient denies dry mouth Cardiovascular: Patient denies chest pain or syncope Respiratory: Patient denies shortness of breath Gastrointestinal: Patient denies nausea, vomiting, constipation, or diarrhea Musculoskeletal:  Patient denies muscle cramps or weakness Neurologic: Patient denies convulsions or seizures Psychiatric: Patient denies memory problems Allergic/Immunologic: Patient denies recent allergic reaction(s) Hematologic/Lymphatic: Patient denies bleeding tendencies Endocrine: Patient denies heat/cold intolerance  GU: As per HPI.  OBJECTIVE Vitals:   06/26/22 0856  BP: 109/76  Pulse: 74  Temp: 98.1 F (36.7 C)   There is no height or weight on file to calculate BMI.  Physical Examination  Constitutional: No obvious distress; patient is non-toxic appearing  Cardiovascular: No visible lower extremity edema.  Respiratory: The patient does not have audible wheezing/stridor; respirations do not appear labored  Gastrointestinal: Abdomen non-distended Musculoskeletal: Normal ROM of UEs  Skin: No obvious rashes/open sores  Neurologic: CN 2-12 grossly intact Psychiatric: Answered questions appropriately with normal affect  Hematologic/Lymphatic/Immunologic: No obvious bruises or sites of spontaneous bleeding  UA: no evidence of UTI or microscopic hematuria PVR: 0 ml  ASSESSMENT Mixed stress and urge urinary incontinence - Plan: Urinalysis, Routine w reflex microscopic, BLADDER SCAN AMB NON-IMAGING, Ambulatory referral to Urology  History of kidney stones - Plan: DG Abd 1 View  Urinary, incontinence, stress female  Urinary urgency  Urge incontinence  We discussed the different forms of urinary incontinence, such as stress and urge incontinence, and described how symptoms are consistent with mixed urinary incontinence.  For treatment of stress urinary incontinence: We discussed expectant management versus nonsurgical options versus surgery. Nonsurgical options include weight loss, Kegel exercises, with or without physical therapy, as well as an incontinence pessary. Surgical options include a midurethral sling, a Burch urethropexy, and transurethral injection of a bulking agent. She would  like to proceed with pessary fitting.  For treatment of urinary urgency, urge urinary incontinence and post-void dribbling: We discussed management including behavioral therapy (decreasing bladder irritants, urge suppression strategies, timed voids, bladder retraining, modifying fluid intake), physical therapy, medication; and for refractory cases posterior tibial nerve stimulation, sacral neuromodulation, and intravesical botulinum toxin injection. We agreed to proceed with timed voiding and double voiding for now.   For stone history: We agreed to obtain KUB to assess current stone burden. Advised adequate fluid intake to stone prevention.  Will plan for follow  up in 6 months for UUI and stone follow up here, or sooner if needed. Pt verbalized understanding and agreement. All questions were answered.  PLAN Advised the following: For stress incontinence: Follow up at [facility] for pessary fitting and management. For urgency and urge incontinence: Timed voiding. For post void dribbling: Double voiding. For stone history: KUB. Return in about 6 months (around 12/26/2022) for UA, PVR, & f/u with Candice Georges NP.  Orders Placed This Encounter  Procedures   DG Abd 1 View    Standing Status:   Future    Standing Expiration Date:   06/26/2023    Order Specific Question:   Reason for Exam (SYMPTOM  OR DIAGNOSIS REQUIRED)    Answer:   kidney stone    Order Specific Question:   Is patient pregnant?    Answer:   No    Order Specific Question:   Preferred imaging location?    Answer:   [facility]   Urinalysis, Routine w reflex microscopic   Ambulatory referral to Urology    Referral Priority:   Routine    Referral Type:   Consultation    Referral Reason:   Specialty Services Required    Requested Specialty:   Urology    Number of Visits Requested:   1   BLADDER SCAN AMB NON-IMAGING    It has been explained that the patient is to follow regularly with their PCP in addition to  all other providers involved in their care and to follow instructions provided by these respective offices. Patient advised to contact urology clinic if any urologic-pertaining questions, concerns, new symptoms or problems arise in the interim period.  There are no Patient Instructions on file for this visit.  Electronically signed by:  Donnita Falls, MSN, FNP-C, CUNP 06/26/2022 10:23 AM

## 2022-06-25 NOTE — Telephone Encounter (Signed)
Pt called [facility] to request a refill on [place]. Pt was told it would be $200.00. I spoke with [facility] pharmacy. Pt was sent a link to apply for copay card. Pt needs to show the card to [facility] and she should be able to get med for as low as $30.00. Pt aware and didn't realize she could use the copay card again. JSY

## 2022-06-25 NOTE — Telephone Encounter (Signed)
   PT has not been seen or evaluated since starting Ajovy-Please advise.

## 2022-06-25 NOTE — Telephone Encounter (Signed)
Received notification from [facility] that PA is needed for Ajovy. KEY: [id number]

## 2022-06-26 ENCOUNTER — Other Ambulatory Visit (HOSPITAL_COMMUNITY): Payer: Self-pay

## 2022-06-26 ENCOUNTER — Encounter: Payer: Self-pay | Admitting: Urology

## 2022-06-26 ENCOUNTER — Ambulatory Visit (INDEPENDENT_AMBULATORY_CARE_PROVIDER_SITE_OTHER): Payer: BC Managed Care – PPO | Admitting: Urology

## 2022-06-26 ENCOUNTER — Telehealth: Payer: Self-pay

## 2022-06-26 VITALS — BP 109/76 | HR 74 | Temp 98.1°F

## 2022-06-26 DIAGNOSIS — N393 Stress incontinence (female) (male): Secondary | ICD-10-CM

## 2022-06-26 DIAGNOSIS — N3946 Mixed incontinence: Secondary | ICD-10-CM | POA: Diagnosis not present

## 2022-06-26 DIAGNOSIS — Z87442 Personal history of urinary calculi: Secondary | ICD-10-CM | POA: Diagnosis not present

## 2022-06-26 DIAGNOSIS — R3915 Urgency of urination: Secondary | ICD-10-CM | POA: Insufficient documentation

## 2022-06-26 DIAGNOSIS — N3941 Urge incontinence: Secondary | ICD-10-CM

## 2022-06-26 LAB — URINALYSIS, ROUTINE W REFLEX MICROSCOPIC
Bilirubin, UA: NEGATIVE
Glucose, UA: NEGATIVE
Ketones, UA: NEGATIVE
Nitrite, UA: NEGATIVE
Protein,UA: NEGATIVE
RBC, UA: NEGATIVE
Specific Gravity, UA: 1.02 (ref 1.005–1.030)
Urobilinogen, Ur: 1 mg/dL (ref 0.2–1.0)
pH, UA: 6.5 (ref 5.0–7.5)

## 2022-06-26 LAB — MICROSCOPIC EXAMINATION: Bacteria, UA: NONE SEEN

## 2022-06-26 LAB — BLADDER SCAN AMB NON-IMAGING

## 2022-06-26 NOTE — Telephone Encounter (Signed)
A new telephone call encounter has been made for this PA Request-please see telephone note dated .Marland KitchenMarland Kitchen6/12/2022.

## 2022-06-26 NOTE — Telephone Encounter (Signed)
Pharmacy Patient Advocate Encounter  Prior Authorization for AJOVY (fremanezumab-vfrm) injection 225MG /1.5ML auto-injectors has been APPROVED by [employer] from 05/27/2022 to 06/26/2023.   PA # PA Case ID: [id number]  Key: [id number] Copay is $24.98 for a 30DS/1.5ML

## 2022-06-26 NOTE — Telephone Encounter (Signed)
Mychart msg sent to pt regarding approval of ajovy and asking where to send it.

## 2022-06-27 MED ORDER — AJOVY 225 MG/1.5ML ~~LOC~~ SOAJ: 1.6800 mL | SUBCUTANEOUS | 5 refills | Status: DC

## 2022-07-15 DIAGNOSIS — N390 Urinary tract infection, site not specified: Secondary | ICD-10-CM | POA: Diagnosis not present

## 2022-07-17 ENCOUNTER — Ambulatory Visit (INDEPENDENT_AMBULATORY_CARE_PROVIDER_SITE_OTHER): Payer: BC Managed Care – PPO | Admitting: Adult Health

## 2022-07-17 ENCOUNTER — Encounter: Payer: Self-pay | Admitting: Adult Health

## 2022-07-17 VITALS — BP 111/76 | HR 78 | Ht 63.0 in | Wt 163.0 lb

## 2022-07-17 DIAGNOSIS — T8332XA Displacement of intrauterine contraceptive device, initial encounter: Secondary | ICD-10-CM | POA: Diagnosis not present

## 2022-07-17 DIAGNOSIS — N950 Postmenopausal bleeding: Secondary | ICD-10-CM | POA: Insufficient documentation

## 2022-07-17 NOTE — Progress Notes (Signed)
  Subjective:     Patient ID: Candice Estrada, female   DOB: [birth year], 47 y.o.   MRN: [id number]  HPI [name] is a 47 year old white female, married, PM, in complaining of vaginal bleeding since Sunday and some cramping. No period in over a year, FSH was 113.9 12/2021 and has paragard IUD. She started [facility] 05/21/22 and has not had any hot flashes since.      Component Value Date/Time   DIAGPAP  05/21/2022 1613    - Negative for Intraepithelial Lesions or Malignancy (NILM)   DIAGPAP - Benign reactive/reparative changes 05/21/2022 1613   DIAGPAP (A) 07/29/2019 1059    - Atypical squamous cells of undetermined significance (ASC-US)   HPVHIGH Negative 05/21/2022 1613   HPVHIGH Negative 07/29/2019 1059   ADEQPAP  05/21/2022 1613    Satisfactory for evaluation; transformation zone component PRESENT.   ADEQPAP  07/29/2019 1059    Satisfactory for evaluation; transformation zone component PRESENT.   ADEQPAP  10/21/2016 0000    Satisfactory for evaluation  endocervical/transformation zone component PRESENT.    PCP is Kurtis Bushman NP  Review of Systems PMB bleeding Some cramping Reviewed past medical,surgical, social and family history. Reviewed medications and allergies.     Objective:   Physical Exam BP 111/76 (BP Location: Left Arm, Patient Position: Sitting, Cuff Size: Normal)   Pulse 78   Ht 5\' 3"  (1.6 m)   Wt 163 lb (73.9 kg)   LMP 07/14/2022   BMI 28.87 kg/m  Skin warm and dry.Pelvic: external genitalia is normal in appearance no lesions, vagina: +blood,urethra has no lesions or masses noted, cervix:smooth, no IUD strings seen, uterus: normal size, shape and contour, non tender, no masses felt, adnexa: no masses or tenderness noted. Bladder is non tender and no masses felt.  Fall risk is low  Upstream - 07/17/22 1509       Pregnancy Intention Screening   Does the patient want to become pregnant in the next year? No    Does the patient's partner want to become pregnant in  the next year? No    Would the patient like to discuss contraceptive options today? No      Contraception Wrap Up   Current Method IUD or IUS   PM   End Method IUD or IUS   PM             Examination chaperoned by Malachy Mood LPN     Assessment:     1. PMB (postmenopausal bleeding) Bleeding since Sunday Pelvic US scheduled for [facility] 07/19/22 at 5:30 pm to assess endometrial lining and IUD  - US PELVIC COMPLETE WITH TRANSVAGINAL; Future  2. Intrauterine contraceptive device threads lost, initial encounter Pelvic US 07/19/22  - US PELVIC COMPLETE WITH TRANSVAGINAL; Future     Plan:     Follow up TBD

## 2022-07-19 ENCOUNTER — Ambulatory Visit (HOSPITAL_BASED_OUTPATIENT_CLINIC_OR_DEPARTMENT_OTHER)
Admission: RE | Admit: 2022-07-19 | Discharge: 2022-07-19 | Disposition: A | Discharge status: Home / Self Care | Payer: BC Managed Care – PPO | Source: Ambulatory Visit | Attending: Adult Health | Admitting: Adult Health

## 2022-07-19 DIAGNOSIS — N950 Postmenopausal bleeding: Secondary | ICD-10-CM | POA: Diagnosis not present

## 2022-07-19 DIAGNOSIS — T8332XA Displacement of intrauterine contraceptive device, initial encounter: Secondary | ICD-10-CM | POA: Insufficient documentation

## 2022-07-19 DIAGNOSIS — N858 Other specified noninflammatory disorders of uterus: Secondary | ICD-10-CM | POA: Diagnosis not present

## 2022-07-19 DIAGNOSIS — N83292 Other ovarian cyst, left side: Secondary | ICD-10-CM | POA: Diagnosis not present

## 2022-07-29 ENCOUNTER — Ambulatory Visit: Payer: Self-pay | Admitting: Adult Health

## 2022-07-29 ENCOUNTER — Other Ambulatory Visit (HOSPITAL_COMMUNITY)
Admission: RE | Admit: 2022-07-29 | Discharge: 2022-07-29 | Disposition: A | Discharge status: Home / Self Care | Payer: BC Managed Care – PPO | Source: Ambulatory Visit | Attending: Obstetrics & Gynecology | Admitting: Obstetrics & Gynecology

## 2022-07-29 ENCOUNTER — Ambulatory Visit: Payer: BC Managed Care – PPO | Admitting: Adult Health

## 2022-07-29 ENCOUNTER — Encounter: Payer: Self-pay | Admitting: Obstetrics & Gynecology

## 2022-07-29 ENCOUNTER — Ambulatory Visit (INDEPENDENT_AMBULATORY_CARE_PROVIDER_SITE_OTHER): Payer: BC Managed Care – PPO | Admitting: Obstetrics & Gynecology

## 2022-07-29 VITALS — BP 113/75 | HR 86 | Ht 63.0 in | Wt 160.4 lb

## 2022-07-29 DIAGNOSIS — N950 Postmenopausal bleeding: Secondary | ICD-10-CM

## 2022-07-29 DIAGNOSIS — N83202 Unspecified ovarian cyst, left side: Secondary | ICD-10-CM

## 2022-07-29 DIAGNOSIS — T8332XD Displacement of intrauterine contraceptive device, subsequent encounter: Secondary | ICD-10-CM | POA: Diagnosis not present

## 2022-07-29 NOTE — Progress Notes (Signed)
GYN VISIT Patient name: Candice Estrada MRN [id number]  Date of birth: [birth year] Chief Complaint:   Endometrial biopsy  History of Present Illness:   Candice Estrada is a 47 y.o. [phone] female being seen today for  The following concerns:   Started about 2 weeks ago- noted about 2-3 full pads per day- lasted about 1-2 weeks then stopped for a few days.  Then this morning noted some light red spotting this am.  Notes some cramping, but think it may be nerves.  Prior to this, last period was about 13-45mos ago.  Of note, Pargard IUD in place.  She otherwise denies pelvic or abdominal pain.  Denies vaginal discharge, itching or irritation.  She does note longstanding history of chronic constipation, usually has a BM every week.  She reports no other acute GYN concerns  Patient's last menstrual period was 07/14/2022.    Review of Systems:   Pertinent items are noted in HPI Denies fever/chills, dizziness, headaches, visual disturbances, fatigue, shortness of breath, chest pain, abdominal pain, vomiting, no problems with urination, or intercourse unless otherwise stated above.  Pertinent History Reviewed:   Past Surgical History:  Procedure Laterality Date   ANKLE ARTHROSCOPY WITH RECONSTRUCTION Left 02/19/2018   Procedure: LEFT ANKLE ARTHROSCOPIC DEBRIDEMENT, LATERAL LIGAMENT RECONSTRUCTION,  OSTEOCHONDRAL TREATMENT;  Surgeon: Terance Hart, MD;  Location: [facility];  Service: Orthopedics;  Laterality: Left;   ASD REPAIR  08/20/2010   CARDIAC SURGERY     CYSTOSCOPY WITH RETROGRADE PYELOGRAM, URETEROSCOPY AND STENT PLACEMENT Left 08/31/2014   Procedure: CYSTOSCOPY WITH BILATERAL RETROGRADE PYELOGRAM, URETEROSCOPY AND STENT PLACEMENT, STONE EXTRACTION WITH BASKET;  Surgeon: Malen Gauze, MD;  Location: [facility];  Service: Urology;  Laterality: Left;   FOOT NEUROMA SURGERY  2009   left   kidney stone removed     THYROID CYST EXCISION     TONSILLECTOMY      vascular necrosis left foot      Past Medical History:  Diagnosis Date   Anxiety    ASD (atrial septal defect), ostium secundum    Status post closure 08/20/10 with Dr. Excell Seltzer; procedure complicated by right groin hematoma and acute blood loss anemia   Atypical squamous cell changes of undetermined significance (ASCUS) on cervical cytology with negative high risk human papilloma virus (HPV) test result 08/04/2019   07/2019 repeat pap in 3 years per ASCCP guidelines 5 year CIN 3+ risk 0.27%   BV (bacterial vaginosis) 10/06/2014   Complication of anesthesia    VASO-VAGAL RESPONSE IN RECOVERY ROOM AFTER HEART SURGERY   Hematuria    History of psoriasis    scalp   History of TIAs    Hx of Coronary Ca Score 03/2019   CT 03/2019: Cor Calcium Score 0   Hx of dizziness    random episodes "because my BP is so low"   Hypotension    IUD (intrauterine device) in place 12/16/2012   Kidney stone    Meniere's disease    Night terrors    NSVT (nonsustained ventricular tachycardia) (HCC)    a. 4 beat run on Zio 2015.   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 Physical Assessment:   Vitals:  07/29/22 1334  BP: 113/75  Pulse: 86  Weight: 160 lb 6.4 oz (72.8 kg)  Height: 5\' 3"  (1.6 m)  Body mass index is 28.41 kg/m.       Physical Examination:   General appearance: alert, well appearing, and in no distress  Psych: mood appropriate, normal affect  Skin: warm & dry   Cardiovascular: normal heart rate noted  Respiratory: normal respiratory effort, no distress  Abdomen: soft, non-tender   Pelvic: VULVA: normal appearing vulva with no masses, tenderness or lesions, VAGINA: normal appearing vagina with normal color and discharge, no lesions, CERVIX:  normal appearing cervix without discharge or lesions, strings not visualized UTERUS: uterus is normal size, shape, consistency and nontender  Extremities: no edema   Chaperone: Jobe Marker    Endometrial Biopsy Procedure Note  Pre-operative Diagnosis: 1) Postmenopausal bleeding  Post-operative Diagnosis: same  Procedure Details  The risks (including infection, bleeding, pain, and uterine perforation) and benefits of the procedure were explained to the patient and Written informed consent was obtained.  Antibiotic prophylaxis against endocarditis was not indicated.   The patient was placed in the dorsal lithotomy position.  Bimanual exam showed the uterus to be in the neutral position.  A speculum inserted in the vagina, and the cervix prepped with betadine.     A single tooth tenaculum was applied to the anterior lip of the cervix for stabilization.  Os finder was used.  A Pipelle endometrial aspirator was used to sample the endometrium.  Sample was sent for pathologic examination.  Condition: Stable  Complications: None    Assessment & Plan:  1) PMB -discussed potential causes ranging from most common to concerning -reviewed recent US, which showed thickened lining -Next step pending results of pathology.   The patient was advised to call for any fever or for prolonged or severe pain or bleeding. She was advised to use OTC analgesics as needed for mild to moderate pain. She was advised to avoid vaginal intercourse for 48 hours or until the bleeding has completely stopped.  2) Lost IUD strings -discussed removal in the future either in office or by hysteroscopy -pt plans to postpone for now  3) Left ovarian cyst -US imaging independently reviewed, findings appear benign -plan to repeat US in 2-13mos for follow up  Orders Placed This Encounter  Procedures   US PELVIC COMPLETE WITH TRANSVAGINAL    Return in about 1 year (around 07/29/2023) for Annual.   Myna Hidalgo, DO Attending Obstetrician & Gynecologist, Faculty Practice [facility] for [facility], [facility]

## 2022-07-29 NOTE — Addendum Note (Signed)
Addended by: Moss Mc on: 07/29/2022 03:03 PM   Modules accepted: Orders

## 2022-07-31 LAB — SURGICAL PATHOLOGY

## 2022-08-01 ENCOUNTER — Other Ambulatory Visit: Payer: Self-pay | Admitting: Obstetrics & Gynecology

## 2022-08-01 ENCOUNTER — Encounter: Payer: Self-pay | Admitting: Obstetrics & Gynecology

## 2022-08-07 ENCOUNTER — Other Ambulatory Visit: Payer: Self-pay | Admitting: Obstetrics & Gynecology

## 2022-08-07 DIAGNOSIS — N950 Postmenopausal bleeding: Secondary | ICD-10-CM

## 2022-08-07 MED ORDER — NORETHINDRONE ACETATE 5 MG PO TABS
5.0000 mg | ORAL_TABLET | Freq: Every day | ORAL | 4 refills | Status: DC
Start: 2022-08-07 — End: 2022-08-27

## 2022-08-07 NOTE — Progress Notes (Signed)
-  Called patient to review results of recent EMB -While negative for endometrial cancer or hyperplasia, proliferative endometrium should not be present in a menopausal patient -Recommended low-dose progesterone daily -Aygestin 5 mg sent in -Plan to follow-up in 2 to 3 months -Questions and concerns were addressed and patient agreeable to above  Myna Hidalgo, DO Attending Obstetrician & Gynecologist, Faculty Practice Center for [employer], [facility]

## 2022-08-13 ENCOUNTER — Other Ambulatory Visit (HOSPITAL_COMMUNITY): Payer: Self-pay | Admitting: Family Medicine

## 2022-08-13 DIAGNOSIS — Z1231 Encounter for screening mammogram for malignant neoplasm of breast: Secondary | ICD-10-CM

## 2022-08-21 ENCOUNTER — Ambulatory Visit: Payer: BC Managed Care – PPO | Admitting: Adult Health

## 2022-08-22 DIAGNOSIS — Z1331 Encounter for screening for depression: Secondary | ICD-10-CM | POA: Diagnosis not present

## 2022-08-22 DIAGNOSIS — Z1339 Encounter for screening examination for other mental health and behavioral disorders: Secondary | ICD-10-CM | POA: Diagnosis not present

## 2022-08-22 DIAGNOSIS — R635 Abnormal weight gain: Secondary | ICD-10-CM | POA: Diagnosis not present

## 2022-08-22 DIAGNOSIS — Z6830 Body mass index (BMI) 30.0-30.9, adult: Secondary | ICD-10-CM | POA: Diagnosis not present

## 2022-08-27 ENCOUNTER — Encounter: Payer: Self-pay | Admitting: Obstetrics & Gynecology

## 2022-08-27 ENCOUNTER — Other Ambulatory Visit: Payer: Self-pay | Admitting: Obstetrics & Gynecology

## 2022-08-27 DIAGNOSIS — N950 Postmenopausal bleeding: Secondary | ICD-10-CM

## 2022-08-27 MED ORDER — MEGESTROL ACETATE 20 MG PO TABS
ORAL_TABLET | ORAL | 3 refills | Status: DC
Start: 2022-08-27 — End: 2022-09-19

## 2022-08-27 NOTE — Progress Notes (Signed)
Megace taper ordered, f/u in 2-67mos  Stop Aygestin

## 2022-08-28 ENCOUNTER — Ambulatory Visit (HOSPITAL_BASED_OUTPATIENT_CLINIC_OR_DEPARTMENT_OTHER): Payer: BC Managed Care – PPO

## 2022-08-28 ENCOUNTER — Ambulatory Visit (HOSPITAL_BASED_OUTPATIENT_CLINIC_OR_DEPARTMENT_OTHER)
Admission: RE | Admit: 2022-08-28 | Discharge: 2022-08-28 | Disposition: A | Discharge status: Home / Self Care | Payer: BC Managed Care – PPO | Source: Ambulatory Visit | Attending: Obstetrics & Gynecology | Admitting: Obstetrics & Gynecology

## 2022-08-28 DIAGNOSIS — N83202 Unspecified ovarian cyst, left side: Secondary | ICD-10-CM | POA: Diagnosis not present

## 2022-08-28 DIAGNOSIS — D251 Intramural leiomyoma of uterus: Secondary | ICD-10-CM | POA: Diagnosis not present

## 2022-08-28 DIAGNOSIS — N950 Postmenopausal bleeding: Secondary | ICD-10-CM | POA: Diagnosis not present

## 2022-09-05 ENCOUNTER — Ambulatory Visit (INDEPENDENT_AMBULATORY_CARE_PROVIDER_SITE_OTHER): Payer: BC Managed Care – PPO

## 2022-09-05 ENCOUNTER — Encounter: Payer: Self-pay | Admitting: Podiatry

## 2022-09-05 ENCOUNTER — Ambulatory Visit (INDEPENDENT_AMBULATORY_CARE_PROVIDER_SITE_OTHER): Payer: BC Managed Care – PPO | Admitting: Podiatry

## 2022-09-05 DIAGNOSIS — M7752 Other enthesopathy of left foot: Secondary | ICD-10-CM

## 2022-09-05 DIAGNOSIS — M778 Other enthesopathies, not elsewhere classified: Secondary | ICD-10-CM | POA: Diagnosis not present

## 2022-09-05 MED ORDER — MELOXICAM 15 MG PO TABS: 15.0000 mg | ORAL_TABLET | Freq: Every day | ORAL | 3 refills | Status: AC

## 2022-09-05 MED ORDER — METHYLPREDNISOLONE 4 MG PO TBPK: ORAL_TABLET | ORAL | 0 refills | Status: DC

## 2022-09-08 NOTE — Progress Notes (Signed)
She presents today for follow-up of pain to the plantar aspect of the foot states that it has been hurting a lot on the bottom along the lateral side as she points to the area and in the sinus tarsi as she points to that area.  Both of these areas previously had surgery states that the primary area is the sinus tarsi area.  Objective: Vital signs are stable alert and oriented x 3.  There is no erythema some mild edema no cellulitis drainage or odor she does have significant pain on end range of motion of the subtalar joint as well as pain on palpation of the sinus tarsi.  She has some tenderness on palpation of the forefoot but the second toe we will perform the interpositional arthroplasty has done very well.  And is currently nontender.   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  Plan: Discussed etiology pathology conservative surgical therapies at this point I started her on methylprednisolone to be followed by meloxicam.  I will follow-up with her on an as-needed basis.

## 2022-09-19 ENCOUNTER — Encounter: Payer: Self-pay | Admitting: Obstetrics & Gynecology

## 2022-09-19 ENCOUNTER — Ambulatory Visit (INDEPENDENT_AMBULATORY_CARE_PROVIDER_SITE_OTHER): Payer: BC Managed Care – PPO | Admitting: Obstetrics & Gynecology

## 2022-09-19 VITALS — BP 103/66 | HR 75 | Ht 63.0 in | Wt 163.0 lb

## 2022-09-19 DIAGNOSIS — N950 Postmenopausal bleeding: Secondary | ICD-10-CM

## 2022-09-19 DIAGNOSIS — Z975 Presence of (intrauterine) contraceptive device: Secondary | ICD-10-CM

## 2022-09-19 MED ORDER — NORETHINDRONE ACETATE 5 MG PO TABS
5.0000 mg | ORAL_TABLET | Freq: Every day | ORAL | 3 refills | Status: DC
Start: 2022-09-19 — End: 2023-04-16

## 2022-09-19 NOTE — Progress Notes (Signed)
GYN VISIT Patient name: Candice Estrada MRN [id number]  Date of birth: 02-Dec-1975 Chief Complaint:   Follow-up (Still bleeding, wants hysterectomy)  History of Present Illness:   Candice Estrada is a 47 y.o. [phone] PM female being seen today for follow up regarding:     To recap since July she has had daily bleeding- some days are worse than others.  Prior to July, she had gone a year without a period.  Seen in July where she noted a 2 week episode of moderate bleeding using about 2-3 pads per day.  Initially tried progestin, while the bleeding was less it did not completely resolve.  She then tried to make his taper, which actually only made her bleeding worse.  She then stopped this medication.  As of currently she is typically using about 2 pads per day of BRB.   Of note ParaGard is also in place with lost IUD strings and has been in place for several years.  Workup included the following:   EMB: proliferative endometrium [place] completed: 08/2022: 8.9 x 4.9 x 5.2 cm = volume: 118.2 ML. There is a 9 mm intramural fibroid anterior uterine fundus.  IUD in proper location.  Right ovary not seen.  Normal left ovary.   No LMP recorded. (Menstrual status: IUD).    Review of Systems:   Pertinent items are noted in HPI Denies fever/chills, dizziness, headaches, visual disturbances, fatigue, shortness of breath, chest pain, abdominal pain, vomiting, no problems with  bowel movements, urination, or intercourse unless otherwise stated above.  Pertinent History Reviewed:   Past Surgical History:  Procedure Laterality Date   ANKLE ARTHROSCOPY WITH RECONSTRUCTION Left 02/19/2018   Procedure: LEFT ANKLE ARTHROSCOPIC DEBRIDEMENT, LATERAL LIGAMENT RECONSTRUCTION,  OSTEOCHONDRAL TREATMENT;  Surgeon: Terance Hart, MD;  Location: [facility];  Service: Orthopedics;  Laterality: Left;   ASD REPAIR  08/20/2010   CARDIAC SURGERY     CYSTOSCOPY WITH RETROGRADE PYELOGRAM, URETEROSCOPY  AND STENT PLACEMENT Left 08/31/2014   Procedure: CYSTOSCOPY WITH BILATERAL RETROGRADE PYELOGRAM, URETEROSCOPY AND STENT PLACEMENT, STONE EXTRACTION WITH BASKET;  Surgeon: Malen Gauze, MD;  Location: [facility];  Service: Urology;  Laterality: Left;   FOOT NEUROMA SURGERY  2009   left   kidney stone removed     THYROID CYST EXCISION     TONSILLECTOMY     vascular necrosis left foot      Past Medical History:  Diagnosis Date   Anxiety    ASD (atrial septal defect), ostium secundum    Status post closure 08/20/10 with Dr. Excell Seltzer; procedure complicated by right groin hematoma and acute blood loss anemia   Atypical squamous cell changes of undetermined significance (ASCUS) on cervical cytology with negative high risk human papilloma virus (HPV) test result 08/04/2019   07/2019 repeat pap in 3 years per ASCCP guidelines 5 year CIN 3+ risk 0.27%   BV (bacterial vaginosis) 10/06/2014   Complication of anesthesia    VASO-VAGAL RESPONSE IN RECOVERY ROOM AFTER HEART SURGERY   Hematuria    History of psoriasis    scalp   History of TIAs    Hx of Coronary Ca Score 03/2019   CT 03/2019: Cor Calcium Score 0   Hx of dizziness    random episodes "because my BP is so low"   Hypotension    IUD (intrauterine device) in place 12/16/2012   Kidney stone    Meniere's disease    Night terrors    NSVT (nonsustained  ventricular tachycardia) (HCC)    a. 4 beat run on Zio 2015.   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 Physical Assessment:   Vitals:   09/19/22 0949  BP: 103/66  Pulse: 75  Weight: 163 lb (73.9 kg)  Height: 5\' 3"  (1.6 m)  Body mass index is 28.87 kg/m.       Physical Examination:   General appearance: alert, well appearing, and in no  distress  Psych: mood appropriate, normal affect  Skin: warm & dry   Cardiovascular: normal heart rate noted  Respiratory: normal respiratory effort, no distress  Abdomen: soft, non-tender   Pelvic: examination not indicated- previously completed  Extremities: no edema   Chaperone: N/A    Assessment & Plan:  1) Postmenopausal bleeding, non-responsive to hormonal intervention -Discussed plan for robotic- assisted laparoscopic hysterectomy and bilateral salpingectomy  -Explained that this surgery is performed to remove the uterus through several small incisions in the abdomen- pending anatomy 3-5 ports. I discussed the risks and benefits of the surgery, including, but not limited to risk of bleeding, including the need for blood transfusion, infection, damage to surrounding organs and tissues such as damage to bladder, ureter or bowel that would requiring additional procedures.  Discussed possible need for conversion to an open procedure and potential for other complications that cannot be predicted or prevented  -reviewed same day procedure -typical recovery 6-8wks with several weeks off work and 8wks of pelvic rest -questions/concerns were addressed and pt desires to proceed -surgical referral created- Oct 22 -preop labs ordered and check for underlying anemia   Orders Placed This Encounter  Procedures   CBC   Iron, TIBC and Ferritin Panel    Return for tbd.   Myna Hidalgo, DO Attending Obstetrician & Gynecologist, [facility] for [employer], [facility]

## 2022-09-20 LAB — CBC
Hematocrit: 45.3 % (ref 34.0–46.6)
Hemoglobin: 14.8 g/dL (ref 11.1–15.9)
MCH: 28.8 pg (ref 26.6–33.0)
MCHC: 32.7 g/dL (ref 31.5–35.7)
MCV: 88 fL (ref 79–97)
Platelets: 326 10*3/uL (ref 150–450)
RBC: 5.13 x10E6/uL (ref 3.77–5.28)
RDW: 12.4 % (ref 11.7–15.4)
WBC: 6.2 10*3/uL (ref 3.4–10.8)

## 2022-09-20 LAB — IRON,TIBC AND FERRITIN PANEL
Ferritin: 64 ng/mL (ref 15–150)
Iron Saturation: 32 % (ref 15–55)
Iron: 113 ug/dL (ref 27–159)
Total Iron Binding Capacity: 358 ug/dL (ref 250–450)
UIBC: 245 ug/dL (ref 131–425)

## 2022-09-23 ENCOUNTER — Encounter: Payer: Self-pay | Admitting: Obstetrics & Gynecology

## 2022-09-26 ENCOUNTER — Other Ambulatory Visit: Payer: Self-pay | Admitting: Adult Health

## 2022-09-26 ENCOUNTER — Encounter: Payer: Self-pay | Admitting: Obstetrics & Gynecology

## 2022-09-27 ENCOUNTER — Ambulatory Visit (HOSPITAL_COMMUNITY)
Admission: RE | Admit: 2022-09-27 | Discharge: 2022-09-27 | Disposition: A | Discharge status: Home / Self Care | Payer: BC Managed Care – PPO | Source: Ambulatory Visit | Attending: Urology | Admitting: Urology

## 2022-09-27 ENCOUNTER — Ambulatory Visit (HOSPITAL_COMMUNITY)
Admission: RE | Admit: 2022-09-27 | Discharge: 2022-09-27 | Disposition: A | Discharge status: Home / Self Care | Payer: BC Managed Care – PPO | Source: Ambulatory Visit | Attending: Family Medicine | Admitting: Family Medicine

## 2022-09-27 ENCOUNTER — Encounter (HOSPITAL_COMMUNITY): Payer: Self-pay

## 2022-09-27 DIAGNOSIS — Z87442 Personal history of urinary calculi: Secondary | ICD-10-CM

## 2022-09-27 DIAGNOSIS — Z1231 Encounter for screening mammogram for malignant neoplasm of breast: Secondary | ICD-10-CM | POA: Insufficient documentation

## 2022-09-30 ENCOUNTER — Other Ambulatory Visit (HOSPITAL_BASED_OUTPATIENT_CLINIC_OR_DEPARTMENT_OTHER): Payer: BC Managed Care – PPO

## 2022-10-07 ENCOUNTER — Ambulatory Visit: Payer: BC Managed Care – PPO | Admitting: Adult Health

## 2022-10-07 ENCOUNTER — Other Ambulatory Visit: Payer: Self-pay | Admitting: Adult Health

## 2022-10-07 DIAGNOSIS — Z79899 Other long term (current) drug therapy: Secondary | ICD-10-CM

## 2022-10-07 NOTE — Progress Notes (Signed)
Ck CMP on veozah

## 2022-10-12 ENCOUNTER — Encounter: Payer: Self-pay | Admitting: Obstetrics & Gynecology

## 2022-10-16 ENCOUNTER — Telehealth: Payer: Self-pay

## 2022-10-16 NOTE — Telephone Encounter (Signed)
Called patient  about her mychart message "I saw where the xray was negative for kidney stones but i was concerned about the location of the IUD." Patient is very concern and what to know if you had any insight on her concerns, patient is aware that a message will be sent to Sarah. Patient voiced understanding

## 2022-10-17 NOTE — Telephone Encounter (Signed)
Patient was made aware "She needs to consult her GYN provider about that concern" Patient would like to get her A1C check if possible.Patient is aware a message will be sent to Maralyn Sago and someone will reach back out with Maralyn Sago response. Patient voiced understanding

## 2022-10-17 NOTE — Telephone Encounter (Signed)
Tried calling patient with no answer, left detail message making patient aware.

## 2022-10-17 NOTE — Telephone Encounter (Signed)
-----   Message from Donnita Falls sent at 10/17/2022 12:37 PM EDT ----- Regarding: RE: Patient concerns For diabetic testing she will have to consult her PCP because we do not manage that condition. ----- Message ----- From: Troy Sine, CMA Sent: 10/17/2022  12:01 PM EDT To: Donnita Falls, FNP Subject: Patient concerns                               Good morning, I feel like I'm becoming the problem child lately.  I have been having frequent urination and not like an urgency to urinate but like I actually am urinating a lot more throughout the day.  I know this is a sign of diabetes and it runs strongly in my family.  Do you think Huntley Dec could send me an order to get labs for this?   I'm concerned because I definitely haven't been I taking more fluids as I should and I don't know why I have suddenly started this.

## 2022-10-30 ENCOUNTER — Other Ambulatory Visit: Payer: Self-pay | Admitting: Adult Health

## 2022-10-30 DIAGNOSIS — Z01818 Encounter for other preprocedural examination: Secondary | ICD-10-CM | POA: Diagnosis not present

## 2022-10-30 DIAGNOSIS — K5900 Constipation, unspecified: Secondary | ICD-10-CM | POA: Diagnosis not present

## 2022-10-30 DIAGNOSIS — Z860101 Personal history of adenomatous and serrated colon polyps: Secondary | ICD-10-CM | POA: Diagnosis not present

## 2022-10-30 MED ORDER — BUPROPION HCL ER (SR) 150 MG PO TB12: 150.0000 mg | ORAL_TABLET | Freq: Two times a day (BID) | ORAL | 2 refills | Status: DC

## 2022-10-30 NOTE — Progress Notes (Signed)
Will increase Wellbutrin 150 mg SR to bid

## 2022-11-01 ENCOUNTER — Encounter (HOSPITAL_COMMUNITY): Payer: BC Managed Care – PPO

## 2022-11-05 ENCOUNTER — Ambulatory Visit (HOSPITAL_COMMUNITY)
Admission: RE | Admit: 2022-11-05 | Payer: BC Managed Care – PPO | Source: Home / Self Care | Admitting: Obstetrics & Gynecology

## 2022-11-05 ENCOUNTER — Encounter (HOSPITAL_COMMUNITY): Admission: RE | Payer: Self-pay | Source: Home / Self Care

## 2022-11-05 DIAGNOSIS — N950 Postmenopausal bleeding: Secondary | ICD-10-CM

## 2022-11-05 SURGERY — XI ROBOTIC ASSISTED LAPAROSCOPIC HYSTERECTOMY AND SALPINGECTOMY
Anesthesia: General

## 2022-11-06 ENCOUNTER — Other Ambulatory Visit: Payer: BC Managed Care – PPO

## 2022-11-06 DIAGNOSIS — Z0000 Encounter for general adult medical examination without abnormal findings: Secondary | ICD-10-CM | POA: Diagnosis not present

## 2022-11-07 LAB — CBC WITH DIFFERENTIAL/PLATELET
Absolute Lymphocytes: 1564 {cells}/uL (ref 850–3900)
Absolute Monocytes: 372 {cells}/uL (ref 200–950)
Basophils Absolute: 41 {cells}/uL (ref 0–200)
Basophils Relative: 0.7 %
Eosinophils Absolute: 183 {cells}/uL (ref 15–500)
Eosinophils Relative: 3.1 %
HCT: 43.6 % (ref 35.0–45.0)
Hemoglobin: 14.4 g/dL (ref 11.7–15.5)
MCH: 30.1 pg (ref 27.0–33.0)
MCHC: 33 g/dL (ref 32.0–36.0)
MCV: 91.2 fL (ref 80.0–100.0)
MPV: 9.1 fL (ref 7.5–12.5)
Monocytes Relative: 6.3 %
Neutro Abs: 3741 {cells}/uL (ref 1500–7800)
Neutrophils Relative %: 63.4 %
Platelets: 328 10*3/uL (ref 140–400)
RBC: 4.78 10*6/uL (ref 3.80–5.10)
RDW: 13.1 % (ref 11.0–15.0)
Total Lymphocyte: 26.5 %
WBC: 5.9 10*3/uL (ref 3.8–10.8)

## 2022-11-07 LAB — COMPLETE METABOLIC PANEL WITH GFR
AG Ratio: 1.5 (calc) (ref 1.0–2.5)
ALT: 17 U/L (ref 6–29)
AST: 19 U/L (ref 10–35)
Albumin: 4.3 g/dL (ref 3.6–5.1)
Alkaline phosphatase (APISO): 43 U/L (ref 31–125)
BUN/Creatinine Ratio: 11 (calc) (ref 6–22)
BUN: 12 mg/dL (ref 7–25)
CO2: 23 mmol/L (ref 20–32)
Calcium: 9.5 mg/dL (ref 8.6–10.2)
Chloride: 109 mmol/L (ref 98–110)
Creat: 1.05 mg/dL — ABNORMAL HIGH (ref 0.50–0.99)
Globulin: 2.8 g/dL (ref 1.9–3.7)
Glucose, Bld: 88 mg/dL (ref 65–99)
Potassium: 4.2 mmol/L (ref 3.5–5.3)
Sodium: 140 mmol/L (ref 135–146)
Total Bilirubin: 0.5 mg/dL (ref 0.2–1.2)
Total Protein: 7.1 g/dL (ref 6.1–8.1)
eGFR: 66 mL/min/{1.73_m2} (ref >=60)

## 2022-11-07 LAB — LIPID PANEL
Cholesterol: 171 mg/dL (ref <200)
HDL: 49 mg/dL — ABNORMAL LOW (ref >=50)
LDL Cholesterol (Calc): 104 mg/dL — ABNORMAL HIGH
Non-HDL Cholesterol (Calc): 122 mg/dL (ref <130)
Total CHOL/HDL Ratio: 3.5 (calc) (ref <5.0)
Triglycerides: 89 mg/dL (ref <150)

## 2022-11-13 ENCOUNTER — Other Ambulatory Visit: Payer: BC Managed Care – PPO

## 2022-11-13 ENCOUNTER — Encounter: Payer: BC Managed Care – PPO | Admitting: Obstetrics & Gynecology

## 2022-11-14 ENCOUNTER — Encounter: Payer: Self-pay | Admitting: Family Medicine

## 2022-11-14 ENCOUNTER — Ambulatory Visit: Payer: BC Managed Care – PPO | Admitting: Family Medicine

## 2022-11-14 VITALS — BP 112/60 | HR 82 | Temp 98.8°F | Ht 63.0 in | Wt 162.2 lb

## 2022-11-14 DIAGNOSIS — Z6828 Body mass index (BMI) 28.0-28.9, adult: Secondary | ICD-10-CM | POA: Diagnosis not present

## 2022-11-14 DIAGNOSIS — Z0001 Encounter for general adult medical examination with abnormal findings: Secondary | ICD-10-CM

## 2022-11-14 DIAGNOSIS — N939 Abnormal uterine and vaginal bleeding, unspecified: Secondary | ICD-10-CM | POA: Insufficient documentation

## 2022-11-14 DIAGNOSIS — R6889 Other general symptoms and signs: Secondary | ICD-10-CM | POA: Insufficient documentation

## 2022-11-14 DIAGNOSIS — E782 Mixed hyperlipidemia: Secondary | ICD-10-CM | POA: Diagnosis not present

## 2022-11-14 DIAGNOSIS — Z0000 Encounter for general adult medical examination without abnormal findings: Secondary | ICD-10-CM

## 2022-11-14 NOTE — Assessment & Plan Note (Signed)
Discussed consuming calorie reduced, high protein diet. She is walking 2 miles per day 7 days per week. I suggested HIIT or strength training. Also talk to GYN about medications.

## 2022-11-14 NOTE — Assessment & Plan Note (Signed)
Chronic. She is seeing OBGYN and a hysterectomy has been recommended but she is reluctant. I recommended a second opinion. Has tried Megace, Veozah, Aygestin without improvement. Follow up with new OBGYN.

## 2022-11-14 NOTE — Progress Notes (Signed)
Complete physical exam  Patient: Candice Estrada   DOB: May 28, 1975   47 y.o. Female  MRN: [id number]  Subjective:    Chief Complaint  Patient presents with   Annual Exam    Doing well overall. Had an abnormal doppler 2 years ago would like to discuss seeing someone for this.    ELLIN Estrada is a 47 y.o. female who presents today for a complete physical exam. She reports consuming a  high protein  diet. Home exercise routine includes walking 1 hrs per day. She generally feels well. She reports sleeping well. She does have additional problems to discuss today. Concerns include follow up on 2022 vascular ultrasound that reported abnormal right toe-brachial index. She denies swelling, numbness, color change, pain but would like to follow up on these abnormal findings.  The 10-year ASCVD risk score (Arnett DK, et al., 2019) is: 0.7%   Values used to calculate the score:     Age: 1 years     Sex: Female     Is Non-Hispanic African American: No     Diabetic: No     Tobacco smoker: No     Systolic Blood Pressure: 112 mmHg     Is BP treated: No     HDL Cholesterol: 49 mg/dL     Total Cholesterol: 171 mg/dL   Most recent fall risk assessment:    11/14/2022    2:21 PM  Fall Risk   Falls in the past year? 0  Number falls in past yr: 0  Injury with Fall? 0     Most recent depression screenings:    11/14/2022    2:21 PM 05/21/2022    4:00 PM  PHQ 2/9 Scores  PHQ - 2 Score 0 2  PHQ- 9 Score 0 11    Vision:Within last year and Dental: No current dental problems  Patient Active Problem List   Diagnosis Date Noted   Moderate mixed hyperlipidemia not requiring statin therapy 11/14/2022   Abnormal ankle brachial index (ABI) 11/14/2022   BMI 28.0-28.9,adult 11/14/2022   Abnormal uterine bleeding 11/14/2022   IUD threads lost 07/17/2022   PMB (postmenopausal bleeding) 07/17/2022   Urinary urgency 06/26/2022   History of kidney stones 06/26/2022   Urinary, incontinence,  stress female 06/26/2022   Urge incontinence 06/26/2022   Anxiety and depression 05/21/2022   Mixed stress and urge urinary incontinence 05/21/2022   Frequent headaches 05/21/2022   Rib pain on left side 01/09/2022   Amenorrhea 12/31/2021   Mass of lower outer quadrant of right breast 05/24/2020   NSVT (nonsustained ventricular tachycardia) (HCC) 05/17/2020   Autonomic dysfunction - insufficency 05/17/2020   Sebaceous cyst of labia 05/03/2020   Physical exam, annual 03/30/2020   Atypical squamous cell changes of undetermined significance (ASCUS) on cervical cytology with negative high risk human papilloma virus (HPV) test result 08/04/2019   Hot flashes 08/10/2018   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 Excell Seltzer; procedure complicated by right groin hematoma and acute blood loss anemia   Atypical squamous cell changes of undetermined significance (ASCUS) on cervical cytology with negative high risk human papilloma virus (HPV) test result 08/04/2019   07/2019 repeat pap in 3 years per ASCCP guidelines 5 year CIN 3+ risk 0.27%   BV (bacterial vaginosis) 10/06/2014   Complication of anesthesia    VASO-VAGAL RESPONSE IN RECOVERY ROOM AFTER HEART SURGERY   Hematuria    History of psoriasis    scalp   History of TIAs    Hx of Coronary Ca Score 03/2019   CT 03/2019: Cor Calcium Score 0   Hx of dizziness    random episodes  "because my BP is so low"   Hypotension    IUD (intrauterine device) in place 12/16/2012   Kidney stone    Meniere's disease    Night terrors    NSVT (nonsustained ventricular tachycardia) (HCC)    a. 4 beat run on Zio 2015.   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    Thyroid enlarged 12/16/2012   History of cyst will get [place]   Past Surgical History:  Procedure Laterality Date   ANKLE ARTHROSCOPY WITH RECONSTRUCTION Left 02/19/2018   Procedure: LEFT ANKLE ARTHROSCOPIC DEBRIDEMENT, LATERAL LIGAMENT RECONSTRUCTION,  OSTEOCHONDRAL TREATMENT;  Surgeon: Terance Hart, MD;  Location: [facility];  Service: Orthopedics;  Laterality: Left;   ASD REPAIR  08/20/2010   CARDIAC SURGERY     CYSTOSCOPY WITH RETROGRADE PYELOGRAM, URETEROSCOPY AND STENT PLACEMENT Left 08/31/2014   Procedure: CYSTOSCOPY WITH BILATERAL RETROGRADE PYELOGRAM, URETEROSCOPY AND STENT PLACEMENT, STONE EXTRACTION WITH BASKET;  Surgeon: Malen Gauze, MD;  Location: [facility];  Service: Urology;  Laterality: Left;   FOOT NEUROMA SURGERY  2009   left   kidney stone removed     THYROID CYST EXCISION     TONSILLECTOMY     vascular necrosis left foot     Social History   Tobacco Use   Smoking status: Never   Smokeless tobacco: Never  Vaping Use   Vaping status: Never Used  Substance Use Topics   Alcohol use: Yes   Drug use: No   Family History  Problem Relation Age of Onset   Liver disease Mother 31       dead   Cancer Mother        breast   Cancer Father    Hypertension Father 70   Stroke Father    Heart attack Father 4   Brain cancer Father    Heart disease Father    Diabetes Father    Hypertension Brother 61   Cancer Paternal Aunt        breast    Dementia Maternal Grandmother    Cancer Maternal Grandmother        thyroid   Cancer Paternal  Grandmother        breast   Kidney failure Daughter    Cancer Other        mom's grandma-breast   Allergies  Allergen Reactions   Linaclotide Diarrhea      Patient Care Team: Park Meo, FNP as PCP - General (Family Medicine) Tonny Bollman, MD as PCP - Cardiology (Cardiology) Duke Salvia, MD as PCP - Electrophysiology (Cardiology)   Outpatient Medications Prior  to Visit  Medication Sig   buPROPion (WELLBUTRIN SR) 150 MG 12 hr tablet Take 1 tablet (150 mg total) by mouth 2 (two) times daily.   cyclobenzaprine (FLEXERIL) 5 MG tablet Take 1 tablet (5 mg total) by mouth at bedtime. (Patient taking differently: Take 5 mg by mouth at bedtime as needed for muscle spasms.)   ibuprofen (ADVIL) 800 MG tablet TAKE 1 TABLET EVERY 8 HOURS AS NEEDED   MELATONIN PO Take 3 mg by mouth at bedtime.   Multiple Vitamin (MULTIVITAMIN) tablet Take 1 tablet by mouth daily.   omeprazole (PRILOSEC) 20 MG capsule Take 20 mg by mouth daily.   PARAGARD INTRAUTERINE COPPER IU by Intrauterine route.   Plecanatide (TRULANCE) 3 MG TABS Take 3 mg by mouth at bedtime.   EYSUVIS 0.25 % SUSP Apply 1 drop to eye 2 (two) times daily as needed (dry eyes). (Patient not taking: Reported on 11/14/2022)   meloxicam (MOBIC) 15 MG tablet Take 1 tablet (15 mg total) by mouth daily. (Patient not taking: Reported on 11/14/2022)   norethindrone (AYGESTIN) 5 MG tablet Take 1 tablet (5 mg total) by mouth daily.   VEOZAH 45 MG TABS Take 1 tablet by mouth daily. (Patient not taking: Reported on 11/14/2022)   No facility-administered medications prior to visit.    Review of Systems  Constitutional: Negative.   HENT: Negative.    Eyes: Negative.   Respiratory: Negative.    Cardiovascular: Negative.   Gastrointestinal: Negative.   Genitourinary: Negative.  Dysuria: abnormal uterine bleeding.  Musculoskeletal: Negative.   Skin: Negative.   Neurological: Negative.   Endo/Heme/Allergies: Negative.    Psychiatric/Behavioral: Negative.    All other systems reviewed and are negative.         Objective:     BP 112/60 (BP Location: Left Arm)   Pulse 82   Temp 98.8 F (37.1 C)   Ht 5\' 3"  (1.6 m)   Wt 162 lb 4 oz (73.6 kg)   SpO2 99%   BMI 28.74 kg/m  BP Readings from Last 3 Encounters:  11/14/22 112/60  09/19/22 103/66  07/29/22 113/75   Wt Readings from Last 3 Encounters:  11/14/22 162 lb 4 oz (73.6 kg)  09/19/22 163 lb (73.9 kg)  07/29/22 160 lb 6.4 oz (72.8 kg)      Physical Exam Vitals and nursing note reviewed.  Constitutional:      Appearance: Normal appearance. She is overweight.  HENT:     Head: Normocephalic and atraumatic.     Right Ear: Tympanic membrane, ear canal and external ear normal.     Left Ear: Tympanic membrane, ear canal and external ear normal.     Nose: Nose normal.     Mouth/Throat:     Mouth: Mucous membranes are moist.     Pharynx: Oropharynx is clear.  Eyes:     Extraocular Movements: Extraocular movements intact.     Conjunctiva/sclera: Conjunctivae normal.     Pupils: Pupils are equal, round, and reactive to light.  Cardiovascular:     Rate and Rhythm: Normal rate and regular rhythm.     Pulses: Normal pulses.     Heart sounds: Normal heart sounds.  Pulmonary:     Effort: Pulmonary effort is normal.     Breath sounds: Normal breath sounds.  Abdominal:     General: Bowel sounds are normal.     Palpations: Abdomen is soft.  Musculoskeletal:        General: Normal range of motion.  Cervical back: Normal range of motion and neck supple.  Skin:    General: Skin is warm and dry.     Capillary Refill: Capillary refill takes less than 2 seconds.  Neurological:     General: No focal deficit present.     Mental Status: She is alert and oriented to person, place, and time. Mental status is at baseline.  Psychiatric:        Mood and Affect: Mood normal.        Behavior: Behavior normal.        Thought Content: Thought content  normal.        Judgment: Judgment normal.      No results found for any visits on 11/14/22. Last CBC Lab Results  Component Value Date   WBC 5.9 11/06/2022   HGB 14.4 11/06/2022   HCT 43.6 11/06/2022   MCV 91.2 11/06/2022   MCH 30.1 11/06/2022   RDW 13.1 11/06/2022   PLT 328 11/06/2022   Last metabolic panel Lab Results  Component Value Date   GLUCOSE 88 11/06/2022   NA 140 11/06/2022   K 4.2 11/06/2022   CL 109 11/06/2022   CO2 23 11/06/2022   BUN 12 11/06/2022   CREATININE 1.05 (H) 11/06/2022   EGFR 66 11/06/2022   CALCIUM 9.5 11/06/2022   PROT 7.1 11/06/2022   ALBUMIN 4.6 03/10/2020   LABGLOB 2.9 03/10/2020   AGRATIO 1.6 03/10/2020   BILITOT 0.5 11/06/2022   ALKPHOS 57 03/10/2020   AST 19 11/06/2022   ALT 17 11/06/2022   ANIONGAP 5 07/09/2017   Last lipids Lab Results  Component Value Date   CHOL 171 11/06/2022   HDL 49 (L) 11/06/2022   LDLCALC 104 (H) 11/06/2022   TRIG 89 11/06/2022   CHOLHDL 3.5 11/06/2022   Last hemoglobin A1c Lab Results  Component Value Date   HGBA1C 5.3 03/10/2020   Last thyroid functions Lab Results  Component Value Date   TSH 1.78 01/02/2022   T4TOTAL 8.2 12/18/2012   Last vitamin D Lab Results  Component Value Date   VD25OH 38.9 03/10/2020   Last vitamin B12 and Folate Lab Results  Component Value Date   VITAMINB12 411 03/10/2020        Assessment & Plan:    Routine Health Maintenance and Physical Exam  Immunization History  Administered Date(s) Administered   Influenza-Unspecified 10/22/2017   Tdap 09/09/2012    Health Maintenance  Topic Date Due   COVID-19 Vaccine (1) Never done   INFLUENZA VACCINE  08/15/2022   DTaP/Tdap/Td (2 - Td or Tdap) 09/10/2022   Cervical Cancer Screening (HPV/Pap Cotest)  05/21/2027   Colonoscopy  02/10/2031   Hepatitis C Screening  Completed   HIV Screening  Completed   HPV VACCINES  Aged Out    Discussed health benefits of physical activity, and encouraged her to  engage in regular exercise appropriate for her age and condition.  Problem List Items Addressed This Visit     Physical exam, annual - Primary    Today your medical history was reviewed and routine physical exam with labs was performed. Recommend 150 minutes of moderate intensity exercise weekly and consuming a well-balanced diet. Advised to stop smoking if a smoker, avoid smoking if a non-smoker, limit alcohol consumption to 1 drink per day for women and 2 drinks per day for men, and avoid illicit drug use.  Counseled in mental health awareness and when to seek medical care. Vaccine maintenance discussed. Appropriate health maintenance items  reviewed. Return to office in 1 year for annual physical exam. Referral placed to vascular surgery for follow up on abnormal toe-brachial index from 2022 study.      Moderate mixed hyperlipidemia not requiring statin therapy    Your labs showed elevated cholesterol. I recommend consuming a heart healthy diet such as Mediterranean diet or DASH diet with whole grains, fruits, vegetable, fish, lean meats, nuts, and olive oil. Limit sweets and processed foods. I also encourage moderate intensity exercise 150 minutes weekly. This is 3-5 times weekly for 30-50 minutes each session. Goal should be pace of 3 miles/hours, or walking 1.5 miles in 30 minutes. The 10-year ASCVD risk score (Arnett DK, et al., 2019) is: 0.7%       Abnormal ankle brachial index (ABI)    Referral placed to vascular surgery for follow up on abnormal toe-brachial index from 2022 study.      BMI 28.0-28.9,adult    Discussed consuming calorie reduced, high protein diet. She is walking 2 miles per day 7 days per week. I suggested HIIT or strength training. Also talk to GYN about medications.       Abnormal uterine bleeding    Chronic. She is seeing OBGYN and a hysterectomy has been recommended but she is reluctant. I recommended a second opinion. Has tried Megace, Veozah, Aygestin without  improvement. Follow up with new OBGYN.      Return in about 1 year (around 11/14/2023) for annual physical with labs 1 week prior.     Park Meo, FNP

## 2022-11-14 NOTE — Assessment & Plan Note (Signed)
Your labs showed elevated cholesterol. I recommend consuming a heart healthy diet such as Mediterranean diet or DASH diet with whole grains, fruits, vegetable, fish, lean meats, nuts, and olive oil. Limit sweets and processed foods. I also encourage moderate intensity exercise 150 minutes weekly. This is 3-5 times weekly for 30-50 minutes each session. Goal should be pace of 3 miles/hours, or walking 1.5 miles in 30 minutes. The 10-year ASCVD risk score (Arnett DK, et al., 2019) is: 0.7%

## 2022-11-14 NOTE — Assessment & Plan Note (Signed)
Referral placed to vascular surgery for follow up on abnormal toe-brachial index from 2022 study.

## 2022-11-14 NOTE — Assessment & Plan Note (Signed)
Today your medical history was reviewed and routine physical exam with labs was performed. Recommend 150 minutes of moderate intensity exercise weekly and consuming a well-balanced diet. Advised to stop smoking if a smoker, avoid smoking if a non-smoker, limit alcohol consumption to 1 drink per day for women and 2 drinks per day for men, and avoid illicit drug use.  Counseled in mental health awareness and when to seek medical care. Vaccine maintenance discussed. Appropriate health maintenance items reviewed. Return to office in 1 year for annual physical exam. Referral placed to vascular surgery for follow up on abnormal toe-brachial index from 2022 study.

## 2022-11-15 ENCOUNTER — Other Ambulatory Visit: Payer: Self-pay | Admitting: Adult Health

## 2022-11-15 MED ORDER — VEOZAH 45 MG PO TABS: 1.0000 | ORAL_TABLET | Freq: Every day | ORAL | 0 refills | Status: AC

## 2022-11-15 NOTE — Progress Notes (Signed)
Refilled Veozah, CMP and LFTs was normal 11/06/22

## 2022-11-29 DIAGNOSIS — H9209 Otalgia, unspecified ear: Secondary | ICD-10-CM | POA: Diagnosis not present

## 2022-12-05 DIAGNOSIS — F4320 Adjustment disorder, unspecified: Secondary | ICD-10-CM | POA: Diagnosis not present

## 2022-12-20 DIAGNOSIS — N3281 Overactive bladder: Secondary | ICD-10-CM | POA: Insufficient documentation

## 2022-12-20 NOTE — Progress Notes (Unsigned)
Name: Candice Estrada DOB: 03-22-1975 MRN: [id number]  History of Present Illness: [name] is a 47 y.o. female who presents today for follow up visit at [facility]. - GU History: 1. Kidney stones. - 08/31/2014: Underwent left ureteroscopic stone manipulation by Dr. Ronne Binning. 2. OAB with urinary urgency, urge urinary incontinence and post-void dribbling. 3. Stress urinary incontinence.  At last visit on 06/26/2022: - Reported mixed urinary incontinence (stress predominant). Previously did pelvic floor physical therapy and felt that was somewhat helpful but discontinued due to time restrictions.  - The plan was: For stress incontinence: Follow up at [facility] for pessary fitting and management. For urgency and urge incontinence: Timed voiding. For post void dribbling: Double voiding. For stone history: KUB. Return in about 6 months (around 12/26/2022) for UA, PVR, & f/u with Evette Georges NP.  Since last visit: > 09/27/2022: KUB negative (no stones seen).  Today: She {Actions; denies-reports:120008} recent stone passage. She {Actions; denies-reports:120008} flank pain or abdominal pain. She {Actions; denies-reports:120008} fevers, nausea, or vomiting.  She {Actions; denies-reports:120008} increased urinary urgency, frequency, nocturia, dysuria, gross hematuria, hesitancy, straining to void, or sensations of incomplete emptying.   Fall Screening: Do you usually have a device to assist in your mobility? {yes/no:20286}  ***cane / ***walker / ***wheelchair  Medications: Current Outpatient Medications  Medication Sig Dispense Refill   buPROPion (WELLBUTRIN SR) 150 MG 12 hr tablet Take 1 tablet (150 mg total) by mouth 2 (two) times daily. 180 tablet 2   cyclobenzaprine (FLEXERIL) 5 MG tablet Take 1 tablet (5 mg total) by mouth at bedtime. (Patient taking differently: Take 5 mg by mouth at bedtime as needed for muscle spasms.) 30 tablet 0   EYSUVIS 0.25  % SUSP Apply 1 drop to eye 2 (two) times daily as needed (dry eyes). (Patient not taking: Reported on 11/14/2022)     ibuprofen (ADVIL) 800 MG tablet TAKE 1 TABLET EVERY 8 HOURS AS NEEDED 90 tablet 11   MELATONIN PO Take 3 mg by mouth at bedtime.     meloxicam (MOBIC) 15 MG tablet Take 1 tablet (15 mg total) by mouth daily. (Patient not taking: Reported on 11/14/2022) 30 tablet 3   Multiple Vitamin (MULTIVITAMIN) tablet Take 1 tablet by mouth daily.     norethindrone (AYGESTIN) 5 MG tablet Take 1 tablet (5 mg total) by mouth daily. 30 tablet 3   omeprazole (PRILOSEC) 20 MG capsule Take 20 mg by mouth daily.     PARAGARD INTRAUTERINE COPPER IU by Intrauterine route.     Plecanatide (TRULANCE) 3 MG TABS Take 3 mg by mouth at bedtime.     VEOZAH 45 MG TABS Take 1 tablet (45 mg total) by mouth daily. 90 tablet 0   No current facility-administered medications for this visit.    Allergies: Allergies  Allergen Reactions   Linaclotide Diarrhea    Past Medical History:  Diagnosis Date   Anxiety    ASD (atrial septal defect), ostium secundum    Status post closure 08/20/10 with Dr. Excell Seltzer; procedure complicated by right groin hematoma and acute blood loss anemia   Atypical squamous cell changes of undetermined significance (ASCUS) on cervical cytology with negative high risk human papilloma virus (HPV) test result 08/04/2019   07/2019 repeat pap in 3 years per ASCCP guidelines 5 year CIN 3+ risk 0.27%   BV (bacterial vaginosis) 10/06/2014   Complication of anesthesia    VASO-VAGAL RESPONSE IN RECOVERY ROOM AFTER HEART SURGERY   Hematuria  History of psoriasis    scalp   History of TIAs    Hx of Coronary Ca Score 03/2019   CT 03/2019: Cor Calcium Score 0   Hx of dizziness    random episodes "because my BP is so low"   Hypotension    IUD (intrauterine device) in place 12/16/2012   Kidney stone    Meniere's disease    Night terrors    NSVT (nonsustained ventricular tachycardia) (HCC)    a.  4 beat run on Zio 2015.   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    Thyroid enlarged 12/16/2012   History of cyst will get [place]   Past Surgical History:  Procedure Laterality Date   ANKLE ARTHROSCOPY WITH RECONSTRUCTION Left 02/19/2018   Procedure: LEFT ANKLE ARTHROSCOPIC DEBRIDEMENT, LATERAL LIGAMENT RECONSTRUCTION,  OSTEOCHONDRAL TREATMENT;  Surgeon: Terance Hart, MD;  Location: [facility];  Service: Orthopedics;  Laterality: Left;   ASD REPAIR  08/20/2010   CARDIAC SURGERY     CYSTOSCOPY WITH RETROGRADE PYELOGRAM, URETEROSCOPY AND STENT PLACEMENT Left 08/31/2014   Procedure: CYSTOSCOPY WITH BILATERAL RETROGRADE PYELOGRAM, URETEROSCOPY AND STENT PLACEMENT, STONE EXTRACTION WITH BASKET;  Surgeon: Malen Gauze, MD;  Location: [facility];  Service: Urology;  Laterality: Left;   FOOT NEUROMA SURGERY  2009   left   kidney stone removed     THYROID CYST EXCISION     TONSILLECTOMY     vascular necrosis left foot     Family History  Problem Relation Age of Onset   Liver disease Mother 99       dead   Cancer Mother        breast   Cancer Father    Hypertension Father 66   Stroke Father    Heart attack Father 55   Brain cancer Father    Heart disease Father    Diabetes Father    Hypertension Brother 50   Cancer Paternal Aunt        breast    Dementia Maternal Grandmother    Cancer Maternal Grandmother        thyroid   Cancer Paternal Grandmother        breast   Kidney failure Daughter    Cancer Other        mom's grandma-breast   Social History   Socioeconomic History   Marital status: Married    Spouse name: Not on file   Number of children: 2   Years of education: College   Highest education level: Not on file  Occupational History   Occupation: LPN    Employer: [employer]    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  Other Topics Concern   Not on file  Social History Narrative   Patient lives at home with her family.   Caffeine Use: rarely   Social Determinants of Health   Financial Resource Strain: Low Risk  (05/21/2022)   Overall Financial Resource Strain (CARDIA)    Difficulty of Paying Living Expenses: Not very hard  Food Insecurity: No Food Insecurity (  05/21/2022)   Hunger Vital Sign    Worried About Running Out of Food in the Last Year: Never true    Ran Out of Food in the Last Year: Never true  Transportation Needs: No Transportation Needs (05/21/2022)   PRAPARE - Administrator, Civil Service (Medical): No    Lack of Transportation (Non-Medical): No  Physical Activity: Insufficiently Active (05/21/2022)   Exercise Vital Sign    Days of Exercise per Week: 3 days    Minutes of Exercise per Session: 30 min  Stress: Stress Concern Present (05/21/2022)   [employer] of Occupational Health - Occupational Stress Questionnaire    Feeling of Stress : Very much  Social Connections: Socially Integrated (05/21/2022)   Social Connection and Isolation Panel [NHANES]    Frequency of Communication with Friends and Family: More than three times a week    Frequency of Social Gatherings with Friends and Family: Three times a week    Attends Religious Services: More than 4 times per year    Active Member of Clubs or Organizations: Yes    Attends Banker Meetings: More than 4 times per year    Marital Status: Married  Catering manager Violence: Not At Risk (05/21/2022)   Humiliation, Afraid, Rape, and Kick questionnaire    Fear of Current or Ex-Partner: No    Emotionally Abused: No    Physically Abused: No    Sexually Abused: No     SUBJECTIVE  Review of Systems*** Constitutional: Patient denies any unintentional weight loss or change in strength lntegumentary: Patient denies any rashes or pruritus Cardiovascular: Patient denies chest pain or syncope Respiratory: Patient denies shortness of breath Gastrointestinal: Patient ***denies nausea, vomiting, constipation, or diarrhea Musculoskeletal: Patient denies muscle cramps or weakness Neurologic: Patient denies convulsions or seizures Allergic/Immunologic: Patient denies recent allergic reaction(s) Hematologic/Lymphatic: Patient denies bleeding tendencies Endocrine: Patient denies heat/cold intolerance  GU: As per HPI.  OBJECTIVE There were no vitals filed for this visit. There is no height or weight on file to calculate BMI.  Physical Examination*** Constitutional: No obvious distress; patient is non-toxic appearing  Cardiovascular: No visible lower extremity edema.  Respiratory: The patient does not have audible wheezing/stridor; respirations do not appear labored  Gastrointestinal: Abdomen non-distended Musculoskeletal: Normal ROM of UEs  Skin: No obvious rashes/open sores  Neurologic: CN 2-12 grossly intact Psychiatric: Answered questions appropriately with normal affect  Hematologic/Lymphatic/Immunologic: No obvious bruises or sites of spontaneous bleeding  UA: ***negative *** WBC/hpf, *** RBC/hpf, *** bacteria ***with no evidence of UTI ***with no evidence of microscopic hematuria PVR: *** ml  ASSESSMENT No diagnosis found.  ***We reviewed recent imaging results; ***awaiting radiology results, appears to have ***no acute findings per provider interpretation.  ***For stone prevention: Advised adequate hydration and we discussed option to consider low oxalate diet given that calcium oxalate is the most common type of stone. Handout provided about stone prevention diet.  ***For recurrent stone formers: We discussed option to proceed with 24 hour  urinalysis ([facility]) for metabolic stone evaluation, which may help with targeted recommendations for dietary I medication therapies for stone prevention. Patient elected to ***proceed/ ***hold off.  Will plan to follow up in ***6 months / ***1 year with ***KUB ***RUS for stone surveillance or sooner if needed.  Pt verbalized understanding and agreement. All questions were answered.  PLAN Advised the following: Maintain adequate fluid intake daily. Drink citrus juice (lemon, lime or orange juice) routinely. Low oxalate diet. No follow-ups on file.  No orders of the defined types were placed in this encounter.   It has been explained that the patient is to follow regularly with their PCP in addition to all other providers involved in their care and to follow instructions provided by these respective offices. Patient advised to contact urology clinic if any urologic-pertaining questions, concerns, new symptoms or problems arise in the interim period.  There are no Patient Instructions on file for this visit.  Electronically signed by:  Donnita Falls, MSN, FNP-C, CUNP 12/20/2022 8:30 AM

## 2022-12-23 ENCOUNTER — Ambulatory Visit: Payer: BC Managed Care – PPO | Admitting: Urology

## 2022-12-23 DIAGNOSIS — N3946 Mixed incontinence: Secondary | ICD-10-CM

## 2022-12-23 DIAGNOSIS — N3281 Overactive bladder: Secondary | ICD-10-CM

## 2022-12-23 DIAGNOSIS — Z87442 Personal history of urinary calculi: Secondary | ICD-10-CM

## 2022-12-26 NOTE — Progress Notes (Unsigned)
Cardiology Office Note    Date:  12/26/2022  ID:  Candice Estrada, DOB 1975-08-11, MRN [id number] PCP:  Park Meo, FNP  Cardiologist:  Tonny Bollman, MD  Electrophysiologist:  Sherryl Manges, MD   Chief Complaint: ***  History of Present Illness: Marland Kitchen    Candice Estrada is a 47 y.o. female with visit-pertinent history of transcatheter ASD closure in 2012 after presenting with a TIA in the setting of a small ostium secundum ASD, POTS/NSVT (followed by EP) with diagnosis complicated by semicircular canal dehiscence, migraines, B12 deficiency, ocular surface squamous neoplasia, SBO 06/2017 seen for follow-up.  She was remotely followed by Dr. Excell Seltzer for ASD closure but most of her care has been with Dr. Graciela Husbands for recurrent palpitations and near-syncope. Dr. Graciela Husbands indicated possible POTS, although she ultimately ended up with a diagnosis of dehiscence of her semicircular canals. Florinef, proamantine, and beta blocker have not been helpful. Her symptoms were previously aggravated by temperature changes and menses. SAEKGs have been normal. She has also worn several monitors over the years showing PACs, PVCs, and brief NSVT. 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 Stress echo 01/2018 was normal except she did have hypotension at peak stress- Dr. Excell Seltzer felt more of an autonomic issue than ischemia as EKG was normal and she had no angina. Cardiac MRI 03/2018 showing normal RV/LV, no scar/infiltration, recommendation to f/u ASD with 2D echo.  2D Echo 06/2018 showed EF 60-65%, bubble study negative without convincing color flow across the ASD per Dr. Earmon Phoenix review. She had atypical chest pain in 2021 with calcium score of 0. Last echo 2022 showed EF 60-65%, G1DD, atrial septum is thickened and echogenic, c/w prior device closure without shunt. LE ABIs 2022 for AVN issues were generally reassuring with  normal bilateral ABIs and no evidence of significant LE arterial disease (abnormal R TBI only). Repeat monitor with EP 12/2021 showed avg HR 76bpm, <1% PACs/PVCs, range 51-151bpm.  Graciela Husbands f/u 6 months 11/2021 note CKD stage 2   H/o ASD closure H/o PACs, PVCs, NSVT H/o TIA  CKD stage 2   Labwork independently reviewed: 10/2022 LDL 104, trig 89, Cr 1.05, K 4.2, LFTs OK, CBC wnl 2023 TSH wnl  ROS: .    Please see the history of present illness. Otherwise, review of systems is positive for ***.  All other systems are reviewed and otherwise negative.  Studies Reviewed: Marland Kitchen    EKG:  EKG is ordered today, personally reviewed, demonstrating ***  CV Studies: Cardiac studies reviewed are outlined and summarized above. Otherwise please see EMR for full report.   Current Reported Medications:.    No outpatient medications have been marked as taking for the 12/27/22 encounter (Appointment) with Laurann Montana, PA-C.    Physical Exam:    VS:  There were no vitals taken for this visit.   Wt Readings from Last 3 Encounters:  11/14/22 162 lb 4 oz (73.6 kg)  09/19/22 163 lb (73.9 kg)  07/29/22 160 lb 6.4 oz (72.8 kg)    GEN: Well nourished, well developed in no acute distress NECK: No JVD; No carotid bruits CARDIAC: ***RRR, no murmurs, rubs, gallops RESPIRATORY:  Clear to auscultation without rales, wheezing or rhonchi  ABDOMEN: Soft, non-tender, non-distended EXTREMITIES:  No edema; No acute deformity   Asessement and Plan:.     ***     Disposition:  F/u with ***  Signed, Laurann Montana, PA-C

## 2022-12-27 ENCOUNTER — Ambulatory Visit: Payer: BC Managed Care – PPO | Attending: Physician Assistant | Admitting: Physician Assistant

## 2022-12-27 DIAGNOSIS — I491 Atrial premature depolarization: Secondary | ICD-10-CM

## 2022-12-27 DIAGNOSIS — G459 Transient cerebral ischemic attack, unspecified: Secondary | ICD-10-CM

## 2022-12-27 DIAGNOSIS — I493 Ventricular premature depolarization: Secondary | ICD-10-CM

## 2022-12-27 DIAGNOSIS — Z8774 Personal history of (corrected) congenital malformations of heart and circulatory system: Secondary | ICD-10-CM

## 2022-12-27 DIAGNOSIS — N182 Chronic kidney disease, stage 2 (mild): Secondary | ICD-10-CM

## 2022-12-27 DIAGNOSIS — I4729 Other ventricular tachycardia: Secondary | ICD-10-CM

## 2022-12-30 ENCOUNTER — Encounter: Payer: Self-pay | Admitting: Physician Assistant

## 2022-12-31 DIAGNOSIS — N938 Other specified abnormal uterine and vaginal bleeding: Secondary | ICD-10-CM | POA: Diagnosis not present

## 2022-12-31 DIAGNOSIS — Z30431 Encounter for routine checking of intrauterine contraceptive device: Secondary | ICD-10-CM | POA: Diagnosis not present

## 2023-01-13 ENCOUNTER — Other Ambulatory Visit: Payer: Self-pay | Admitting: Adult Health

## 2023-01-13 MED ORDER — IBUPROFEN 800 MG PO TABS: 800.0000 mg | ORAL_TABLET | Freq: Three times a day (TID) | ORAL | 11 refills | Status: DC | PRN

## 2023-01-13 MED ORDER — IBUPROFEN 800 MG PO TABS: 800.0000 mg | ORAL_TABLET | Freq: Three times a day (TID) | ORAL | 11 refills | Status: AC | PRN

## 2023-01-13 NOTE — Progress Notes (Signed)
Sent ibuprofen to [facility]

## 2023-01-13 NOTE — Progress Notes (Signed)
[facility]

## 2023-02-05 DIAGNOSIS — Z30431 Encounter for routine checking of intrauterine contraceptive device: Secondary | ICD-10-CM | POA: Diagnosis not present

## 2023-02-05 DIAGNOSIS — N938 Other specified abnormal uterine and vaginal bleeding: Secondary | ICD-10-CM | POA: Diagnosis not present

## 2023-04-16 ENCOUNTER — Encounter: Payer: Self-pay | Admitting: Obstetrics & Gynecology

## 2023-04-16 ENCOUNTER — Other Ambulatory Visit: Payer: Self-pay | Admitting: Obstetrics & Gynecology

## 2023-04-16 DIAGNOSIS — N950 Postmenopausal bleeding: Secondary | ICD-10-CM

## 2023-04-16 MED ORDER — NORETHINDRONE ACETATE 5 MG PO TABS: 5.0000 mg | ORAL_TABLET | Freq: Every day | ORAL | 3 refills | Status: DC

## 2023-04-16 NOTE — Progress Notes (Signed)
 Resent Rx for name brand only

## 2023-06-12 DIAGNOSIS — H189 Unspecified disorder of cornea: Secondary | ICD-10-CM | POA: Diagnosis not present

## 2023-07-14 ENCOUNTER — Other Ambulatory Visit: Payer: Self-pay | Admitting: Adult Health

## 2023-08-18 DIAGNOSIS — R21 Rash and other nonspecific skin eruption: Secondary | ICD-10-CM | POA: Diagnosis not present

## 2023-09-30 ENCOUNTER — Other Ambulatory Visit (HOSPITAL_COMMUNITY): Payer: Self-pay | Admitting: Family Medicine

## 2023-09-30 DIAGNOSIS — Z1231 Encounter for screening mammogram for malignant neoplasm of breast: Secondary | ICD-10-CM

## 2023-10-01 ENCOUNTER — Inpatient Hospital Stay (HOSPITAL_COMMUNITY): Admission: RE | Admit: 2023-10-01 | Source: Ambulatory Visit

## 2023-10-01 DIAGNOSIS — Z1231 Encounter for screening mammogram for malignant neoplasm of breast: Secondary | ICD-10-CM

## 2023-11-17 ENCOUNTER — Encounter: Payer: BC Managed Care – PPO | Admitting: Family Medicine

## 2024-02-02 ENCOUNTER — Other Ambulatory Visit (HOSPITAL_COMMUNITY): Payer: Self-pay | Admitting: Family Medicine

## 2024-02-02 DIAGNOSIS — Z1231 Encounter for screening mammogram for malignant neoplasm of breast: Secondary | ICD-10-CM

## 2024-02-04 ENCOUNTER — Other Ambulatory Visit: Payer: Self-pay | Admitting: *Deleted

## 2024-02-04 DIAGNOSIS — N950 Postmenopausal bleeding: Secondary | ICD-10-CM

## 2024-02-04 MED ORDER — NORETHINDRONE ACETATE 5 MG PO TABS: 5.0000 mg | ORAL_TABLET | Freq: Every day | ORAL | 3 refills | Status: AC

## 2024-02-06 ENCOUNTER — Encounter (HOSPITAL_COMMUNITY)

## 2024-02-06 DIAGNOSIS — Z1231 Encounter for screening mammogram for malignant neoplasm of breast: Secondary | ICD-10-CM

## 2024-02-19 ENCOUNTER — Encounter (HOSPITAL_COMMUNITY): Payer: Self-pay | Admitting: Radiology

## 2024-02-19 ENCOUNTER — Other Ambulatory Visit (HOSPITAL_COMMUNITY): Payer: Self-pay | Admitting: Family Medicine

## 2024-02-19 DIAGNOSIS — Z1231 Encounter for screening mammogram for malignant neoplasm of breast: Secondary | ICD-10-CM

## 2024-02-20 ENCOUNTER — Encounter (HOSPITAL_COMMUNITY): Payer: Self-pay

## 2024-02-20 ENCOUNTER — Inpatient Hospital Stay (HOSPITAL_COMMUNITY): Admission: RE | Admit: 2024-02-20

## 2024-02-20 DIAGNOSIS — Z1231 Encounter for screening mammogram for malignant neoplasm of breast: Secondary | ICD-10-CM

## 2024-03-09 ENCOUNTER — Ambulatory Visit: Admitting: Adult Health
